# Patient Record
Sex: Female | Born: 1935
Health system: Southern US, Community
[De-identification: ages and names within clinical notes are randomized; demographics above are authoritative.]

## PROBLEM LIST (undated history)

## (undated) DIAGNOSIS — N289 Disorder of kidney and ureter, unspecified: Secondary | ICD-10-CM

## (undated) DIAGNOSIS — H33009 Unspecified retinal detachment with retinal break, unspecified eye: Secondary | ICD-10-CM

## (undated) DIAGNOSIS — H3530 Unspecified macular degeneration: Secondary | ICD-10-CM

## (undated) DIAGNOSIS — L309 Dermatitis, unspecified: Secondary | ICD-10-CM

## (undated) DIAGNOSIS — M8580 Other specified disorders of bone density and structure, unspecified site: Secondary | ICD-10-CM

## (undated) DIAGNOSIS — D649 Anemia, unspecified: Secondary | ICD-10-CM

## (undated) DIAGNOSIS — A499 Bacterial infection, unspecified: Secondary | ICD-10-CM

## (undated) DIAGNOSIS — K219 Gastro-esophageal reflux disease without esophagitis: Secondary | ICD-10-CM

## (undated) DIAGNOSIS — E538 Deficiency of other specified B group vitamins: Secondary | ICD-10-CM

## (undated) DIAGNOSIS — H33311 Horseshoe tear of retina without detachment, right eye: Secondary | ICD-10-CM

## (undated) DIAGNOSIS — E785 Hyperlipidemia, unspecified: Secondary | ICD-10-CM

## (undated) DIAGNOSIS — H903 Sensorineural hearing loss, bilateral: Secondary | ICD-10-CM

## (undated) DIAGNOSIS — N952 Postmenopausal atrophic vaginitis: Secondary | ICD-10-CM

## (undated) DIAGNOSIS — M1990 Unspecified osteoarthritis, unspecified site: Secondary | ICD-10-CM

## (undated) DIAGNOSIS — E079 Disorder of thyroid, unspecified: Secondary | ICD-10-CM

## (undated) DIAGNOSIS — L989 Disorder of the skin and subcutaneous tissue, unspecified: Secondary | ICD-10-CM

## (undated) DIAGNOSIS — Z0000 Encounter for general adult medical examination without abnormal findings: Secondary | ICD-10-CM

## (undated) DIAGNOSIS — N390 Urinary tract infection, site not specified: Secondary | ICD-10-CM

## (undated) DIAGNOSIS — M329 Systemic lupus erythematosus, unspecified: Secondary | ICD-10-CM

## (undated) DIAGNOSIS — G43109 Migraine with aura, not intractable, without status migrainosus: Secondary | ICD-10-CM

## (undated) DIAGNOSIS — M4800 Spinal stenosis, site unspecified: Secondary | ICD-10-CM

## (undated) DIAGNOSIS — E559 Vitamin D deficiency, unspecified: Secondary | ICD-10-CM

## (undated) DIAGNOSIS — N6019 Diffuse cystic mastopathy of unspecified breast: Secondary | ICD-10-CM

## (undated) HISTORY — DX: Spinal stenosis, site unspecified: M48.00

## (undated) HISTORY — DX: Hyperlipidemia, unspecified: E78.5

## (undated) HISTORY — DX: Dermatitis, unspecified: L30.9

## (undated) HISTORY — PX: CATARACT EXTRACTION W/ INTRAOCULAR LENS  IMPLANT, BILATERAL: SHX1307

## (undated) HISTORY — DX: Encounter for general adult medical examination without abnormal findings: Z00.00

## (undated) HISTORY — DX: Sensorineural hearing loss, bilateral: H90.3

## (undated) HISTORY — DX: Systemic lupus erythematosus, unspecified: M32.9

## (undated) HISTORY — DX: Migraine with aura, not intractable, without status migrainosus: G43.109

## (undated) HISTORY — DX: Urinary tract infection, site not specified: A49.9

## (undated) HISTORY — DX: Urinary tract infection, site not specified: N39.0

## (undated) HISTORY — DX: Anemia, unspecified: D64.9

## (undated) HISTORY — DX: Unspecified osteoarthritis, unspecified site: M19.90

## (undated) HISTORY — DX: Disorder of the skin and subcutaneous tissue, unspecified: L98.9

## (undated) HISTORY — DX: Gastro-esophageal reflux disease without esophagitis: K21.9

## (undated) HISTORY — DX: Vitamin D deficiency, unspecified: E55.9

## (undated) HISTORY — PX: EYE SURGERY: SHX253

## (undated) HISTORY — DX: Disorder of thyroid, unspecified: E07.9

## (undated) HISTORY — DX: Deficiency of other specified B group vitamins: E53.8

## (undated) HISTORY — DX: Unspecified retinal detachment with retinal break, unspecified eye: H33.009

## (undated) HISTORY — DX: Unspecified macular degeneration: H35.30

## (undated) HISTORY — DX: Horseshoe tear of retina without detachment, right eye: H33.311

## (undated) HISTORY — PX: VAGINAL HYSTERECTOMY: SHX2639

## (undated) HISTORY — PX: CHOLECYSTECTOMY: SHX55

## (undated) HISTORY — DX: Disorder of kidney and ureter, unspecified: N28.9

## (undated) HISTORY — DX: Postmenopausal atrophic vaginitis: N95.2

## (undated) HISTORY — DX: Diffuse cystic mastopathy of unspecified breast: N60.19

## (undated) HISTORY — DX: Other specified disorders of bone density and structure, unspecified site: M85.80

---
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
## 2004-09-14 HISTORY — PX: APPENDECTOMY: SHX54

## 2008-04-14 ENCOUNTER — Emergency Department (HOSPITAL_COMMUNITY): Admission: EM | Admit: 2008-04-14 | Discharge: 2008-04-14 | Payer: Self-pay | Admitting: Emergency Medicine

## 2008-04-26 ENCOUNTER — Emergency Department (HOSPITAL_COMMUNITY): Admission: EM | Admit: 2008-04-26 | Discharge: 2008-04-26 | Payer: Self-pay | Admitting: Emergency Medicine

## 2009-03-14 ENCOUNTER — Encounter: Admission: RE | Admit: 2009-03-14 | Discharge: 2009-03-14 | Payer: Self-pay | Admitting: Internal Medicine

## 2009-07-11 ENCOUNTER — Encounter: Admission: RE | Admit: 2009-07-11 | Discharge: 2009-07-11 | Payer: Self-pay | Admitting: Rheumatology

## 2010-03-18 ENCOUNTER — Encounter: Admission: RE | Admit: 2010-03-18 | Discharge: 2010-03-18 | Payer: Self-pay | Admitting: Physician Assistant

## 2010-11-03 ENCOUNTER — Ambulatory Visit (INDEPENDENT_AMBULATORY_CARE_PROVIDER_SITE_OTHER): Payer: Medicare HMO | Admitting: Internal Medicine

## 2010-11-03 DIAGNOSIS — I10 Essential (primary) hypertension: Secondary | ICD-10-CM

## 2010-11-03 DIAGNOSIS — K210 Gastro-esophageal reflux disease with esophagitis: Secondary | ICD-10-CM

## 2010-11-03 DIAGNOSIS — E039 Hypothyroidism, unspecified: Secondary | ICD-10-CM

## 2010-11-03 DIAGNOSIS — N390 Urinary tract infection, site not specified: Secondary | ICD-10-CM

## 2010-11-18 ENCOUNTER — Ambulatory Visit (INDEPENDENT_AMBULATORY_CARE_PROVIDER_SITE_OTHER): Payer: Medicare HMO | Admitting: Internal Medicine

## 2010-11-18 ENCOUNTER — Encounter (INDEPENDENT_AMBULATORY_CARE_PROVIDER_SITE_OTHER): Payer: Self-pay | Admitting: *Deleted

## 2010-11-18 DIAGNOSIS — E039 Hypothyroidism, unspecified: Secondary | ICD-10-CM

## 2010-11-18 DIAGNOSIS — R319 Hematuria, unspecified: Secondary | ICD-10-CM

## 2010-11-18 DIAGNOSIS — Z0000 Encounter for general adult medical examination without abnormal findings: Secondary | ICD-10-CM

## 2010-11-18 DIAGNOSIS — I10 Essential (primary) hypertension: Secondary | ICD-10-CM

## 2010-11-25 NOTE — Letter (Signed)
Summary: Pre Visit Letter Revised  [facility]  [address] [place], [place] [address]   Phone: [phone]  Fax: [phone]        11/18/2010 MRN: [id number] Molly Williams [address] [place], [place]  [address]             Procedure Date:  12/18/2010 @ 8:30   Direct colon-Dr. Arlyce Dice   Welcome to the Gastroenterology Division at [facility].    You are scheduled to see a nurse for your pre-procedure visit on 12/05/2010 at 9:00 on the 3rd floor at [facility], [address]  We ask that you try to arrive at our office 15 minutes prior to your appointment time to allow for check-in.  Please take a minute to review the attached form.  If you answer "Yes" to one or more of the questions on the first page, we ask that you call the person listed at your earliest opportunity.  If you answer "No" to all of the questions, please complete the rest of the form and bring it to your appointment.    Your nurse visit will consist of discussing your medical and surgical history, your immediate family medical history, and your medications.   If you are unable to list all of your medications on the form, please bring the medication bottles to your appointment and we will list them.  We will need to be aware of both prescribed and over the counter drugs.  We will need to know exact dosage information as well.    Please be prepared to read and sign documents such as consent forms, a financial agreement, and acknowledgement forms.  If necessary, and with your consent, a friend or relative is welcome to sit-in on the nurse visit with you.  Please bring your insurance card so that we may make a copy of it.  If your insurance requires a referral to see a specialist, please bring your referral form from your primary care physician.  No co-pay is required for this nurse visit.     If you cannot keep your appointment, please call [phone] to cancel or reschedule prior to  your appointment date.  This allows [place] the opportunity to schedule an appointment for another patient in need of care.    Thank you for choosing [facility] for your medical needs.  We appreciate the opportunity to care for you.  Please visit [place] at our website  to learn more about our practice.  Sincerely, The Gastroenterology Division

## 2010-12-01 ENCOUNTER — Ambulatory Visit (INDEPENDENT_AMBULATORY_CARE_PROVIDER_SITE_OTHER): Payer: Medicare HMO | Admitting: Internal Medicine

## 2010-12-01 DIAGNOSIS — G4700 Insomnia, unspecified: Secondary | ICD-10-CM

## 2010-12-01 DIAGNOSIS — I10 Essential (primary) hypertension: Secondary | ICD-10-CM

## 2010-12-05 ENCOUNTER — Ambulatory Visit (AMBULATORY_SURGERY_CENTER): Payer: Medicare HMO

## 2010-12-05 VITALS — Ht 63.5 in | Wt 120.8 lb

## 2010-12-05 DIAGNOSIS — Z1211 Encounter for screening for malignant neoplasm of colon: Secondary | ICD-10-CM

## 2010-12-05 MED ORDER — PEG-KCL-NACL-NASULF-NA ASC-C 100 G PO SOLR: 1.0000 | Freq: Once | ORAL | Status: AC

## 2010-12-08 ENCOUNTER — Encounter: Payer: Self-pay | Admitting: Gastroenterology

## 2010-12-18 ENCOUNTER — Ambulatory Visit (AMBULATORY_SURGERY_CENTER): Payer: Medicare HMO | Admitting: Gastroenterology

## 2010-12-18 ENCOUNTER — Other Ambulatory Visit: Payer: Medicare HMO | Admitting: Gastroenterology

## 2010-12-18 ENCOUNTER — Encounter: Payer: Self-pay | Admitting: Gastroenterology

## 2010-12-18 VITALS — BP 149/61 | HR 63 | Temp 98.1°F | Resp 19 | Ht 63.0 in | Wt 116.0 lb

## 2010-12-18 DIAGNOSIS — D126 Benign neoplasm of colon, unspecified: Secondary | ICD-10-CM

## 2010-12-18 DIAGNOSIS — Z9889 Other specified postprocedural states: Secondary | ICD-10-CM

## 2010-12-18 DIAGNOSIS — K5730 Diverticulosis of large intestine without perforation or abscess without bleeding: Secondary | ICD-10-CM

## 2010-12-18 DIAGNOSIS — Z1211 Encounter for screening for malignant neoplasm of colon: Secondary | ICD-10-CM

## 2010-12-18 MED ORDER — SODIUM CHLORIDE 0.9 % IV SOLN: 500.0000 mL | INTRAVENOUS | Status: DC

## 2010-12-18 NOTE — Patient Instructions (Addendum)
Polyps, Colon  A polyp is extra tissue that grows inside your body. Colon polyps grow in the large intestine. The large intestine, also called the colon, is part of your digestive system. It is a long, hollow tube at the end of your digestive tract where your body makes and stores stool. Most polyps are not dangerous. They are benign. This means they are not cancerous. But over time, some types of polyps can turn into cancer. Polyps that are smaller than a pea are usually not harmful. But larger polyps could someday become or may already be cancerous. To be safe, doctors remove all polyps and test them.  WHO GETS POLYPS? Anyone can get polyps, but certain people are more likely than others. You may have a greater chance of getting polyps if:  You are over 50.   You have had polyps before.   Someone in your family has had polyps.   Someone in your family has had cancer of the large intestine.   Find out if someone in your family has had polyps. You may also be more likely to get polyps if you:   Eat a lot of fatty foods   Smoke   Drink alcohol   Do not exercise  Eat too much  SYMPTOMS Most small polyps do not cause symptoms. People often do not know they have one until their caregiver finds it during a regular checkup or while testing them for something else. Some people do have symptoms like these:  Bleeding from the anus. You might notice blood on your underwear or on toilet paper after you have had a bowel movement.   Constipation or diarrhea that lasts more than a week.   Blood in the stool. Blood can make stool look black or it can show up as red streaks in the stool.  If you have any of these symptoms, see your caregiver. HOW DOES THE DOCTOR TEST FOR POLYPS? The doctor can use four tests to check for polyps:  Digital rectal exam. The caregiver wears gloves and checks your rectum (the last part of the large intestine) to see if it feels normal. This test would find polyps only  in the rectum. Your caregiver may need to do one of the other tests listed below to find polyps higher up in the intestine.   Barium enema. The caregiver puts a liquid called barium into your rectum before taking x-rays of your large intestine. Barium makes your intestine look white in the pictures. Polyps are dark, so they are easy to see.   Sigmoidoscopy. With this test, the caregiver can see inside your large intestine. A thin flexible tube is placed into your rectum. The device is called a sigmoidoscope, which has a light and a tiny video camera in it. The caregiver uses the sigmoidoscope to look at the last third of your large intestine.   Colonoscopy. This test is like sigmoidoscopy, but the caregiver looks at all of the large intestine. It usually requires sedation. This is the most common method for finding and removing polyps.  TREATMENT  The caregiver will remove the polyp during sigmoidoscopy or colonoscopy. The polyp is then tested for cancer.   If you have had polyps, your caregiver may want you to get tested regularly in the future.  PREVENTION There is not one sure way to prevent polyps. You might be able to lower your risk of getting them if you:  Eat more fruits and vegetables and less fatty food.     Do not smoke.   Avoid alcohol.   Exercise every day.   Lose weight if you are overweight.   Eating more calcium and folate can also lower your risk of getting polyps. Some foods that are rich in calcium are milk, cheese, and broccoli. Some foods that are rich in folate are chickpeas, kidney beans, and spinach.   Aspirin might help prevent polyps. Studies are under way.  Document Released: 05/27/2004 Document Re-Released: 02/18/2010 [facility] Patient Information 2011 [place], [place].  Diverticulosis and High Fiber Diet instructions given.  Follow up with primary care provider for any additional concerns.

## 2010-12-22 ENCOUNTER — Telehealth: Payer: Self-pay | Admitting: *Deleted

## 2010-12-22 NOTE — Telephone Encounter (Signed)

## 2010-12-25 ENCOUNTER — Other Ambulatory Visit: Payer: Medicare HMO | Admitting: Gastroenterology

## 2010-12-26 ENCOUNTER — Encounter: Payer: Self-pay | Admitting: Gastroenterology

## 2011-02-03 ENCOUNTER — Ambulatory Visit (INDEPENDENT_AMBULATORY_CARE_PROVIDER_SITE_OTHER): Payer: Medicare HMO | Admitting: Internal Medicine

## 2011-02-03 DIAGNOSIS — M545 Low back pain: Secondary | ICD-10-CM

## 2011-02-03 DIAGNOSIS — N390 Urinary tract infection, site not specified: Secondary | ICD-10-CM

## 2011-02-03 DIAGNOSIS — I10 Essential (primary) hypertension: Secondary | ICD-10-CM

## 2011-02-17 ENCOUNTER — Ambulatory Visit: Payer: Medicare HMO | Admitting: Internal Medicine

## 2011-02-20 ENCOUNTER — Other Ambulatory Visit: Payer: Self-pay | Admitting: Internal Medicine

## 2011-02-20 DIAGNOSIS — Z1231 Encounter for screening mammogram for malignant neoplasm of breast: Secondary | ICD-10-CM

## 2011-02-24 ENCOUNTER — Other Ambulatory Visit: Payer: Self-pay | Admitting: Internal Medicine

## 2011-02-24 DIAGNOSIS — M8580 Other specified disorders of bone density and structure, unspecified site: Secondary | ICD-10-CM

## 2011-03-20 ENCOUNTER — Ambulatory Visit
Admission: RE | Admit: 2011-03-20 | Discharge: 2011-03-20 | Disposition: A | Discharge status: Home / Self Care | Payer: Medicare HMO | Source: Ambulatory Visit | Attending: Internal Medicine | Admitting: Internal Medicine

## 2011-03-20 DIAGNOSIS — Z1231 Encounter for screening mammogram for malignant neoplasm of breast: Secondary | ICD-10-CM

## 2011-03-20 DIAGNOSIS — M8580 Other specified disorders of bone density and structure, unspecified site: Secondary | ICD-10-CM

## 2011-03-20 LAB — HM MAMMOGRAPHY: HM Mammogram: NEGATIVE

## 2011-03-20 LAB — HM DEXA SCAN

## 2011-03-23 ENCOUNTER — Encounter: Payer: Self-pay | Admitting: Emergency Medicine

## 2011-03-24 ENCOUNTER — Encounter: Payer: Self-pay | Admitting: Emergency Medicine

## 2011-04-21 ENCOUNTER — Other Ambulatory Visit: Payer: Self-pay | Admitting: Internal Medicine

## 2011-04-21 ENCOUNTER — Encounter: Payer: Self-pay | Admitting: Internal Medicine

## 2011-04-21 ENCOUNTER — Ambulatory Visit (INDEPENDENT_AMBULATORY_CARE_PROVIDER_SITE_OTHER): Payer: Medicare HMO | Admitting: Internal Medicine

## 2011-04-21 VITALS — BP 138/60 | HR 56 | Temp 98.8°F | Ht 63.25 in | Wt 119.0 lb

## 2011-04-21 DIAGNOSIS — E039 Hypothyroidism, unspecified: Secondary | ICD-10-CM | POA: Insufficient documentation

## 2011-04-21 DIAGNOSIS — R319 Hematuria, unspecified: Secondary | ICD-10-CM | POA: Insufficient documentation

## 2011-04-21 DIAGNOSIS — M8580 Other specified disorders of bone density and structure, unspecified site: Secondary | ICD-10-CM | POA: Insufficient documentation

## 2011-04-21 DIAGNOSIS — N952 Postmenopausal atrophic vaginitis: Secondary | ICD-10-CM | POA: Insufficient documentation

## 2011-04-21 DIAGNOSIS — K219 Gastro-esophageal reflux disease without esophagitis: Secondary | ICD-10-CM | POA: Insufficient documentation

## 2011-04-21 DIAGNOSIS — M4800 Spinal stenosis, site unspecified: Secondary | ICD-10-CM | POA: Insufficient documentation

## 2011-04-21 DIAGNOSIS — N390 Urinary tract infection, site not specified: Secondary | ICD-10-CM

## 2011-04-21 DIAGNOSIS — A499 Bacterial infection, unspecified: Secondary | ICD-10-CM | POA: Insufficient documentation

## 2011-04-21 DIAGNOSIS — E079 Disorder of thyroid, unspecified: Secondary | ICD-10-CM | POA: Insufficient documentation

## 2011-04-21 DIAGNOSIS — N6019 Diffuse cystic mastopathy of unspecified breast: Secondary | ICD-10-CM | POA: Insufficient documentation

## 2011-04-21 DIAGNOSIS — H9190 Unspecified hearing loss, unspecified ear: Secondary | ICD-10-CM | POA: Insufficient documentation

## 2011-04-21 DIAGNOSIS — M1990 Unspecified osteoarthritis, unspecified site: Secondary | ICD-10-CM | POA: Insufficient documentation

## 2011-04-21 DIAGNOSIS — H903 Sensorineural hearing loss, bilateral: Secondary | ICD-10-CM | POA: Insufficient documentation

## 2011-04-21 DIAGNOSIS — K8020 Calculus of gallbladder without cholecystitis without obstruction: Secondary | ICD-10-CM | POA: Insufficient documentation

## 2011-04-21 DIAGNOSIS — M329 Systemic lupus erythematosus, unspecified: Secondary | ICD-10-CM | POA: Insufficient documentation

## 2011-04-21 LAB — POCT URINALYSIS DIPSTICK
Bilirubin, UA: NEGATIVE
Glucose, UA: NEGATIVE
Leukocytes, UA: NEGATIVE
Nitrite, UA: NEGATIVE
Urobilinogen, UA: NORMAL

## 2011-04-21 NOTE — Patient Instructions (Signed)
Take prilosec every day until our next visit  Call office Thursday for urine culture results

## 2011-04-21 NOTE — Progress Notes (Signed)
Subjective:    Patient ID: Molly Williams, female    DOB: 11/30/1930, 77 y.o.   MRN: [id number]  HPI  Natausha is here with two concerns.  Her urine has been "frothy" and she was afraid she may have another UTI.  She no frequency or urgency,  Minimal dysuria.  No fever or CVA pain  She also for the past 2-3 weeks has worsening reflux.   She has not been on the Pepcid chewables because she can't find them at the pharmacy. Has night time burning pain, no change in color of stools, .  Appetitie good  Allergies  Allergen Reactions  . Codeine Nausea And Vomiting  . Iodine Hives    Contrast dye   Past Medical History  Diagnosis Date  . Atherosclerosis   . Migraine aura without headache     h/o ocular migraines  . Osteopenia   . Spinal stenosis   . Retinal detachment with break     right/laser  . GERD (gastroesophageal reflux disease)   . Hearing loss sensory, bilateral     bil/hearing aids  . Arthritis   . Urinary tract bacterial infections     has had urethral dilations in past  . Atrophic vaginitis   . Lupus (systemic lupus erythematosus)     Dr. Kellie Simmering  . Hematuria     chronic Dr. Merry Lofty  . Hearing loss   . Fibrocystic breast disease     bx 2008 neg per pt report  . Thyroid disease     hypothyroidism  . Cholelithiasis 2008   Past Surgical History  Procedure Date  . Cataract extraction w/ intraocular lens  implant, bilateral   . Vaginal hysterectomy     partial  . Cholecystectomy   . Tonsillectomy 1940  . Appendectomy 2006   History   Social History  . Marital Status: Widowed    Spouse Name: N/A    Number of Children: N/A  . Years of Education: N/A   Occupational History  . Not on file.   Social History Main Topics  . Smoking status: Former Games developer  . Smokeless tobacco: Never Used  . Alcohol Use: 6.0 oz/week    10 Glasses of wine per week  . Drug Use: No  . Sexually Active: Not on file   Other Topics Concern  . Not on file   Social History  Narrative  . No narrative on file   Family History  Problem Relation Age of Onset  . Stroke Paternal Grandmother   . Stroke Father   . Coronary artery disease Father   . Coronary artery disease Mother    Patient Active Problem List  Diagnoses  . GERD (gastroesophageal reflux disease)  . Thyroid disease  . Arthritis  . Urinary tract bacterial infections  . Atrophic vaginitis  . Osteopenia  . Spinal stenosis  . Hearing loss  . Fibrocystic breast disease  . Fibrocystic breast disease  . Cholelithiasis  . Hematuria   Current Outpatient Prescriptions on File Prior to Visit  Medication Sig Dispense Refill  . acetaminophen (TYLENOL) 500 MG tablet Take 1,000 mg by mouth every 6 (six) hours as needed. pain       . Cholecalciferol (VITAMIN D) 1000 UNITS capsule Take 1,000 Units by mouth daily.        . famotidine (PEPCID AC) 10 MG chewable tablet Chew 10 mg by mouth daily.        Marland Kitchen levothyroxine (SYNTHROID, LEVOTHROID) 25 MCG tablet Take 25 mcg by mouth  daily.      . Estradiol (VAGIFEM) 10 MCG TABS Place 1 tablet vaginally 2 (two) times a week.        Marland Kitchen HYDROcodone-acetaminophen (NORCO) 5-325 MG per tablet Take 1 tablet by mouth every 6 (six) hours as needed. pain        Current Facility-Administered Medications on File Prior to Visit  Medication Dose Route Frequency Provider Last Rate Last Dose  . DISCONTD: 0.9 %  sodium chloride infusion  500 mL Intravenous Continuous Louis Meckel, MD            Review of Systems  See HPI     Objective:   Physical Exam   Physical Exam  Nursing note and vitals reviewed.  Constitutional: She is oriented to person, place, and time. She appears well-developed and well-nourished.  HENT:  Head: Normocephalic and atraumatic.  Cardiovascular: Normal rate and regular rhythm. Exam reveals no gallop and no friction rub.  No murmur heard.  Pulmonary/Chest: Breath sounds normal. She has no wheezes. She has no rales.  ABD:  No CVA tenderness.   NO HSM.  BS normal.  Nontender in epigastrium.  No rebound Neurological: She is alert and oriented to person, place, and time.  Skin: Skin is warm and dry.  Psychiatric: She has a normal mood and affect. Her behavior is normal.             Assessment & Plan  1)  GERD  Will try omeprazole for the next 4 weeks.  If not better may need endoscopy.  Pt aware to make return visit with me in 4 weeks  2) hematuria:  U/A today just shows hematuria which is chronic for her.  Will send for culture and pt to call Thursday for results.  Shw voice understanding

## 2011-04-24 LAB — CULTURE, URINE COMPREHENSIVE: Colony Count: 8000

## 2011-04-27 ENCOUNTER — Telehealth: Payer: Self-pay | Admitting: Emergency Medicine

## 2011-04-27 NOTE — Telephone Encounter (Signed)
LMOVM for pt, advised urine culture shows no infection.  Advised pt to call back with any questions

## 2011-05-21 ENCOUNTER — Ambulatory Visit (INDEPENDENT_AMBULATORY_CARE_PROVIDER_SITE_OTHER): Payer: Medicare HMO | Admitting: Internal Medicine

## 2011-05-21 ENCOUNTER — Encounter: Payer: Self-pay | Admitting: Internal Medicine

## 2011-05-21 VITALS — BP 139/70 | HR 64 | Temp 97.2°F | Resp 16 | Wt 120.0 lb

## 2011-05-21 DIAGNOSIS — K219 Gastro-esophageal reflux disease without esophagitis: Secondary | ICD-10-CM

## 2011-05-21 NOTE — Patient Instructions (Signed)
Continue Prilosec for 4 more weeks  Return if no improvement

## 2011-05-21 NOTE — Progress Notes (Signed)
Subjective:    Patient ID: Molly Williams, female    DOB: 11/10/1930, 77 y.o.   MRN: [id number]  HPI  Molly Williams is doing much better.  No heartburn on Prilosec for the last 4 weeks.  She reports she had an H pylori in the distant past and was told it was negative.  No change in color of stools.   No epigastric pain.  Appetitie good   Allergies  Allergen Reactions  . Codeine Nausea And Vomiting  . Iodine Hives    Contrast dye   Past Medical History  Diagnosis Date  . Migraine aura without headache     h/o ocular migraines  . Osteopenia   . Spinal stenosis   . Retinal detachment with break     right/laser  . GERD (gastroesophageal reflux disease)   . Hearing loss sensory, bilateral     bil/hearing aids  . Arthritis   . Urinary tract bacterial infections     has had urethral dilations in past  . Atrophic vaginitis   . Lupus (systemic lupus erythematosus)     Dr. Kellie Simmering  . Hematuria     chronic Dr. Merry Lofty  . Hearing loss   . Fibrocystic breast disease     bx 2008 neg per pt report  . Thyroid disease     hypothyroidism  . Cholelithiasis 2008   Past Surgical History  Procedure Date  . Cataract extraction w/ intraocular lens  implant, bilateral   . Vaginal hysterectomy     partial  . Cholecystectomy   . Tonsillectomy 1940  . Appendectomy 2006   History   Social History  . Marital Status: Widowed    Spouse Name: N/A    Number of Children: N/A  . Years of Education: N/A   Occupational History  . Not on file.   Social History Main Topics  . Smoking status: Former Games developer  . Smokeless tobacco: Never Used  . Alcohol Use: 6.0 oz/week    10 Glasses of wine per week  . Drug Use: No  . Sexually Active: Not on file   Other Topics Concern  . Not on file   Social History Narrative  . No narrative on file   Family History  Problem Relation Age of Onset  . Stroke Paternal Grandmother   . Stroke Father   . Coronary artery disease Father   . Coronary artery  disease Mother    Patient Active Problem List  Diagnoses  . GERD (gastroesophageal reflux disease)  . Thyroid disease  . Arthritis  . Urinary tract bacterial infections  . Atrophic vaginitis  . Osteopenia  . Spinal stenosis  . Hearing loss  . Fibrocystic breast disease  . Fibrocystic breast disease  . Cholelithiasis  . Hematuria  . Lupus (systemic lupus erythematosus)  . Hearing loss sensory, bilateral   Current Outpatient Prescriptions on File Prior to Visit  Medication Sig Dispense Refill  . acetaminophen (TYLENOL) 500 MG tablet Take 1,000 mg by mouth every 6 (six) hours as needed. pain       . HYDROcodone-acetaminophen (NORCO) 5-325 MG per tablet Take 1 tablet by mouth every 6 (six) hours as needed. pain       . levothyroxine (SYNTHROID, LEVOTHROID) 25 MCG tablet Take 25 mcg by mouth daily.          Review of Systems See HPI    Objective:   Physical Exam  Physical Exam  Nursing note and vitals reviewed.  Constitutional: She is oriented to person,  place, and time. She appears well-developed and well-nourished.  HENT:  Head: Normocephalic and atraumatic.  Cardiovascular: Normal rate and regular rhythm. Exam reveals no gallop and no friction rub.  No murmur heard.  Pulmonary/Chest: Breath sounds normal. She has no wheezes. She has no rales.  ABD:  BS +  No pain in upper quadtants to palpation Neurological: She is alert and oriented to person, place, and time.  Skin: Skin is warm and dry.  Psychiatric: She has a normal mood and affect. Her behavior is normal.        Assessment & Plan:  1)  GERD  Improved.  Will check H Pylori today.  Continue Prilosec for 1 more month.  I counseled pt that if dyspepsia and heartburn returns we will have to consider EGD

## 2011-07-10 ENCOUNTER — Ambulatory Visit (INDEPENDENT_AMBULATORY_CARE_PROVIDER_SITE_OTHER): Payer: Medicare HMO | Admitting: Emergency Medicine

## 2011-07-10 VITALS — BP 116/50 | HR 54 | Temp 97.2°F | Resp 10

## 2011-07-10 DIAGNOSIS — Z23 Encounter for immunization: Secondary | ICD-10-CM

## 2011-07-15 ENCOUNTER — Telehealth: Payer: Self-pay | Admitting: Internal Medicine

## 2011-07-15 NOTE — Telephone Encounter (Signed)
Pt would like to be referral to a Dermatologist; she is not aware of a good one. Her number is [phone].  Thanks

## 2011-07-16 NOTE — Telephone Encounter (Signed)
Per DDS, she would recommend [facility] [place].  LMOAM per pt's request with information

## 2011-07-16 NOTE — Telephone Encounter (Signed)
Spoke with Molly Williams.  She states that she needs to see a dermatologist- she has sun damage that she needs to have checked and a mole that needs to be checked.  Since her insurance does not require a referral, she would like the names of who DDS would recommend and she will be happy to make her own appointment.  Aware I will ask DDS and call her back

## 2011-09-03 ENCOUNTER — Telehealth: Payer: Self-pay | Admitting: Emergency Medicine

## 2011-09-03 NOTE — Telephone Encounter (Signed)
I spoke with Molly Williams this morning.  She states that she spoke with her ENT physician's nurse this morning and they believe she may have a parotid gland infection.  Her ENT no longer treats these types of things- he specializes in surgical procedures and "special cases", so he can not see her for this problem.  They advised she contact her PCP for treatment.  I explained that DDS is OOO until next week.  I advised that she seek care at an Urgent care today- directed her to Urgent care in [place].  She asked for the phone number and I gave it to her.  She is not having any pain, just a sense of fullness in her ear.  No fevers.  She states that she will go there today.

## 2011-12-07 ENCOUNTER — Other Ambulatory Visit: Payer: Self-pay | Admitting: Emergency Medicine

## 2011-12-07 MED ORDER — LEVOTHYROXINE SODIUM 25 MCG PO TABS: 25.0000 ug | ORAL_TABLET | Freq: Every day | ORAL | Status: DC

## 2011-12-07 NOTE — Telephone Encounter (Signed)
Refill request received for Molly Williams's levothyroxine.  Approved and faxed back per DDS.  Molly Williams is due for CPE, called and spoke with [name] about the same.  Scheduled her for CPE Monday 01/18/12 @ 945am.  She has a new Medicare supplement and will bring care to her visit

## 2012-01-18 ENCOUNTER — Encounter: Payer: Medicare HMO | Admitting: Internal Medicine

## 2012-01-28 ENCOUNTER — Encounter: Payer: Self-pay | Admitting: Internal Medicine

## 2012-01-28 ENCOUNTER — Ambulatory Visit (INDEPENDENT_AMBULATORY_CARE_PROVIDER_SITE_OTHER): Payer: MEDICARE | Admitting: Internal Medicine

## 2012-01-28 VITALS — BP 114/60 | HR 60 | Temp 97.4°F | Resp 16 | Ht 64.0 in | Wt 116.0 lb

## 2012-01-28 DIAGNOSIS — E079 Disorder of thyroid, unspecified: Secondary | ICD-10-CM

## 2012-01-28 DIAGNOSIS — E039 Hypothyroidism, unspecified: Secondary | ICD-10-CM

## 2012-01-28 DIAGNOSIS — M129 Arthropathy, unspecified: Secondary | ICD-10-CM

## 2012-01-28 DIAGNOSIS — M1990 Unspecified osteoarthritis, unspecified site: Secondary | ICD-10-CM

## 2012-01-28 DIAGNOSIS — M329 Systemic lupus erythematosus, unspecified: Secondary | ICD-10-CM

## 2012-01-28 DIAGNOSIS — M8580 Other specified disorders of bone density and structure, unspecified site: Secondary | ICD-10-CM

## 2012-01-28 DIAGNOSIS — M4800 Spinal stenosis, site unspecified: Secondary | ICD-10-CM

## 2012-01-28 DIAGNOSIS — K219 Gastro-esophageal reflux disease without esophagitis: Secondary | ICD-10-CM

## 2012-01-28 DIAGNOSIS — M899 Disorder of bone, unspecified: Secondary | ICD-10-CM

## 2012-01-28 DIAGNOSIS — R319 Hematuria, unspecified: Secondary | ICD-10-CM

## 2012-01-28 DIAGNOSIS — Z0000 Encounter for general adult medical examination without abnormal findings: Secondary | ICD-10-CM

## 2012-01-28 LAB — POCT URINALYSIS DIPSTICK
Glucose, UA: NEGATIVE
Ketones, UA: NEGATIVE
Spec Grav, UA: <=1.005
Urobilinogen, UA: >=8

## 2012-01-28 LAB — COMPREHENSIVE METABOLIC PANEL
ALT: 10 U/L (ref 0–35)
AST: 17 U/L (ref 0–37)
Albumin: 4.2 g/dL (ref 3.5–5.2)
Alkaline Phosphatase: 52 U/L (ref 39–117)
Glucose, Bld: 80 mg/dL (ref 70–99)
Potassium: 4.6 mEq/L (ref 3.5–5.3)
Sodium: 136 mEq/L (ref 135–145)
Total Protein: 6.6 g/dL (ref 6.0–8.3)

## 2012-01-28 LAB — CBC WITH DIFFERENTIAL/PLATELET
Basophils Relative: 1 % (ref 0–1)
Hemoglobin: 11.4 g/dL — ABNORMAL LOW (ref 12.0–15.0)
MCHC: 33 g/dL (ref 30.0–36.0)
Monocytes Relative: 15 % — ABNORMAL HIGH (ref 3–12)
Neutro Abs: 2.4 10*3/uL (ref 1.7–7.7)
Neutrophils Relative %: 51 % (ref 43–77)
RBC: 3.55 MIL/uL — ABNORMAL LOW (ref 3.87–5.11)

## 2012-01-28 LAB — LIPID PANEL
LDL Cholesterol: 100 mg/dL — ABNORMAL HIGH (ref 0–99)
Triglycerides: 53 mg/dL (ref <150)

## 2012-01-28 LAB — TSH: TSH: 3.263 u[IU]/mL (ref 0.350–4.500)

## 2012-01-28 NOTE — Patient Instructions (Signed)
See me as needed 

## 2012-01-28 NOTE — Progress Notes (Signed)
Subjective:    Patient ID: Molly Williams, female    DOB: Jul 31, 1931, 78 y.o.   MRN: [id number]  HPI Molly Williams is here for comprehensive eval.  Overall doing well .  Had some problems with allergies but better now.  Lupus and joint artralgias much improved after seeing Dr. Kellie Simmering.    She is not due for mammogram until July  Allergies  Allergen Reactions  . Codeine Nausea And Vomiting  . Iodine Hives    Contrast dye   Past Medical History  Diagnosis Date  . Migraine aura without headache     h/o ocular migraines  . Osteopenia   . Spinal stenosis   . Retinal detachment with break     right/laser  . GERD (gastroesophageal reflux disease)   . Hearing loss sensory, bilateral     bil/hearing aids  . Arthritis   . Urinary tract bacterial infections     has had urethral dilations in past  . Atrophic vaginitis   . Lupus (systemic lupus erythematosus)     Dr. Kellie Simmering  . Hematuria     chronic Dr. Merry Lofty  . Hearing loss   . Fibrocystic breast disease     bx 2008 neg per pt report  . Thyroid disease     hypothyroidism  . Cholelithiasis 2008   Past Surgical History  Procedure Date  . Cataract extraction w/ intraocular lens  implant, bilateral   . Vaginal hysterectomy     partial  . Cholecystectomy   . Tonsillectomy 1940  . Appendectomy 2006   History   Social History  . Marital Status: Widowed    Spouse Name: N/A    Number of Children: N/A  . Years of Education: N/A   Occupational History  . Not on file.   Social History Main Topics  . Smoking status: Former Games developer  . Smokeless tobacco: Never Used  . Alcohol Use: 6.0 oz/week    10 Glasses of wine per week  . Drug Use: No  . Sexually Active: Not on file   Other Topics Concern  . Not on file   Social History Narrative  . No narrative on file   Family History  Problem Relation Age of Onset  . Stroke Paternal Grandmother   . Stroke Father   . Coronary artery disease Father   . Coronary artery disease  Mother    Patient Active Problem List  Diagnoses  . GERD (gastroesophageal reflux disease)  . Thyroid disease  . Arthritis  . Urinary tract bacterial infections  . Atrophic vaginitis  . Osteopenia  . Spinal stenosis  . Hearing loss  . Fibrocystic breast disease  . Fibrocystic breast disease  . Cholelithiasis  . Hematuria  . Lupus (systemic lupus erythematosus)  . Hearing loss sensory, bilateral   Current Outpatient Prescriptions on File Prior to Visit  Medication Sig Dispense Refill  . acetaminophen (TYLENOL) 500 MG tablet Take 1,000 mg by mouth every 6 (six) hours as needed. pain       . calcium-vitamin D (OS-CAL 500 + D) 500-200 MG-UNIT per tablet Take 1 tablet by mouth 2 (two) times daily.        Marland Kitchen HYDROcodone-acetaminophen (NORCO) 5-325 MG per tablet Take 1 tablet by mouth every 6 (six) hours as needed. pain       . levothyroxine (SYNTHROID, LEVOTHROID) 25 MCG tablet Take 1 tablet (25 mcg total) by mouth daily.  90 tablet  0  . omeprazole (PRILOSEC) 20 MG capsule Take 20 mg by  mouth daily.             Review of Systems     Objective:   Physical Exam  Physical Exam  Nursing note and vitals reviewed.  Constitutional: She is oriented to person, place, and time. She appears well-developed and well-nourished.  HENT:  Head: Normocephalic and atraumatic.  Right Ear: Tympanic membrane and ear canal normal. No drainage. Tympanic membrane is not injected and not erythematous.  Left Ear: Tympanic membrane and ear canal normal. No drainage. Tympanic membrane is not injected and not erythematous.  Nose: Nose normal. Right sinus exhibits no maxillary sinus tenderness and no frontal sinus tenderness. Left sinus exhibits no maxillary sinus tenderness and no frontal sinus tenderness.  Mouth/Throat: Oropharynx is clear and moist. No oral lesions. No oropharyngeal exudate.  Eyes: Conjunctivae and EOM are normal. Pupils are equal, round, and reactive to light.  Neck: Normal range of  motion. Neck supple. No JVD present. Carotid bruit is not present. No mass and no thyromegaly present.  Cardiovascular: Normal rate, regular rhythm, S1 normal, S2 normal and intact distal pulses. Exam reveals no gallop and no friction rub.  No murmur heard.  Pulses:  Carotid pulses are 2+ on the right side, and 2+ on the left side.  Dorsalis pedis pulses are 2+ on the right side, and 2+ on the left side.  No carotid bruit. No LE edema  Pulmonary/Chest: Breath sounds normal. She has no wheezes. She has no rales. She exhibits no tenderness.   Breast no discrete mass no nipple discharge no axillary adenopathy bilaterally Abdominal: Soft. Bowel sounds are normal. She exhibits no distension and no mass. There is no hepatosplenomegaly. There is no tenderness. There is no CVA tenderness.  Musculoskeletal: Normal range of motion.  No active synovitis to joints.  Lymphadenopathy:  She has no cervical adenopathy.  She has no axillary adenopathy.  Right: No inguinal and no supraclavicular adenopathy present.  Left: No inguinal and no supraclavicular adenopathy present.  Neurological: She is alert and oriented to person, place, and time. She has normal strength and normal reflexes. She displays no tremor. No cranial nerve deficit or sensory deficit. Coordination and gait normal.  Skin: Skin is warm and dry. No rash noted. No cyanosis. Nails show no clubbing.  Psychiatric: She has a normal mood and affect. Her speech is normal and behavior is normal. Cognition and memory are normal.       Assessment & Plan:  1) Hypothyrodism  Will recheck TSH today 2) GERD  adequatae control 3)  DJD/Lupus/spinal stenosis  Good control  Managed by Dr. Kellie Simmering 4)  Hematuria chronic will send urine for C and S today 5)  Osteopenia  Due for Dexa in 2014.  Continue calcium and vitamin D  I spent 45 mintues with this pt.

## 2012-01-30 LAB — CULTURE, URINE COMPREHENSIVE
Colony Count: NO GROWTH
Organism ID, Bacteria: NO GROWTH

## 2012-02-10 ENCOUNTER — Telehealth: Payer: Self-pay | Admitting: *Deleted

## 2012-02-10 NOTE — Telephone Encounter (Signed)
Copy of labs mailed to pt's home address. 

## 2012-03-02 ENCOUNTER — Other Ambulatory Visit: Payer: Self-pay | Admitting: *Deleted

## 2012-03-03 MED ORDER — LEVOTHYROXINE SODIUM 25 MCG PO TABS: 25.0000 ug | ORAL_TABLET | Freq: Every day | ORAL | Status: DC

## 2012-03-22 ENCOUNTER — Other Ambulatory Visit: Payer: Self-pay | Admitting: Internal Medicine

## 2012-03-22 DIAGNOSIS — Z1231 Encounter for screening mammogram for malignant neoplasm of breast: Secondary | ICD-10-CM

## 2012-03-24 ENCOUNTER — Ambulatory Visit
Admission: RE | Admit: 2012-03-24 | Discharge: 2012-03-24 | Disposition: A | Discharge status: Home / Self Care | Payer: Medicare Other | Source: Ambulatory Visit | Attending: Internal Medicine | Admitting: Internal Medicine

## 2012-03-24 DIAGNOSIS — Z1231 Encounter for screening mammogram for malignant neoplasm of breast: Secondary | ICD-10-CM

## 2012-07-05 ENCOUNTER — Ambulatory Visit (INDEPENDENT_AMBULATORY_CARE_PROVIDER_SITE_OTHER): Payer: Medicare Other | Admitting: *Deleted

## 2012-07-05 DIAGNOSIS — Z23 Encounter for immunization: Secondary | ICD-10-CM

## 2012-12-26 ENCOUNTER — Encounter: Payer: Self-pay | Admitting: Internal Medicine

## 2012-12-26 ENCOUNTER — Ambulatory Visit (INDEPENDENT_AMBULATORY_CARE_PROVIDER_SITE_OTHER): Payer: Medicare Other | Admitting: Internal Medicine

## 2012-12-26 VITALS — BP 121/64 | HR 66 | Temp 98.5°F | Resp 18 | Wt 120.0 lb

## 2012-12-26 DIAGNOSIS — A499 Bacterial infection, unspecified: Secondary | ICD-10-CM

## 2012-12-26 DIAGNOSIS — R3989 Other symptoms and signs involving the genitourinary system: Secondary | ICD-10-CM

## 2012-12-26 DIAGNOSIS — J309 Allergic rhinitis, unspecified: Secondary | ICD-10-CM

## 2012-12-26 DIAGNOSIS — N390 Urinary tract infection, site not specified: Secondary | ICD-10-CM

## 2012-12-26 DIAGNOSIS — R399 Unspecified symptoms and signs involving the genitourinary system: Secondary | ICD-10-CM

## 2012-12-26 LAB — POCT URINALYSIS DIPSTICK
Glucose, UA: NEGATIVE
Nitrite, UA: NEGATIVE
Spec Grav, UA: 1.01
Urobilinogen, UA: NEGATIVE

## 2012-12-26 MED ORDER — CIPROFLOXACIN HCL 250 MG PO TABS: 250.0000 mg | ORAL_TABLET | Freq: Two times a day (BID) | ORAL | Status: DC

## 2012-12-26 NOTE — Patient Instructions (Addendum)
See me in 3 weeks

## 2012-12-26 NOTE — Progress Notes (Signed)
Subjective:    Patient ID: Molly Williams, female    DOB: 10-20-1930, 79 y.o.   MRN: [id number]  HPI  Molly Williams is here for acute visit.    She has had 4 days dysuria with urgency  No fever no flank pain. It has been quite some time since Molly Williams has been here for any UTI related symptoms  She also note her ears have been itching.  She recently bouth Nasacort OTC and just started usling.  No drainage or pain in either ear  Allergies  Allergen Reactions  . Codeine Nausea And Vomiting  . Iodine Hives    Contrast dye   Past Medical History  Diagnosis Date  . Migraine aura without headache     h/o ocular migraines  . Osteopenia   . Spinal stenosis   . Retinal detachment with break     right/laser  . GERD (gastroesophageal reflux disease)   . Hearing loss sensory, bilateral     bil/hearing aids  . Arthritis   . Urinary tract bacterial infections     has had urethral dilations in past  . Atrophic vaginitis   . Lupus (systemic lupus erythematosus)     Dr. Kellie Simmering  . Hematuria     chronic Dr. Merry Lofty  . Hearing loss   . Fibrocystic breast disease     bx 2008 neg per pt report  . Thyroid disease     hypothyroidism  . Cholelithiasis 2008   Past Surgical History  Procedure Laterality Date  . Cataract extraction w/ intraocular lens  implant, bilateral    . Vaginal hysterectomy      partial  . Cholecystectomy    . Tonsillectomy  1940  . Appendectomy  2006   History   Social History  . Marital Status: Widowed    Spouse Name: N/A    Number of Children: N/A  . Years of Education: N/A   Occupational History  . Not on file.   Social History Main Topics  . Smoking status: Former Games developer  . Smokeless tobacco: Never Used  . Alcohol Use: 6.0 oz/week    10 Glasses of wine per week  . Drug Use: No  . Sexually Active: Not on file   Other Topics Concern  . Not on file   Social History Narrative  . No narrative on file   Family History  Problem Relation Age of Onset  .  Stroke Paternal Grandmother   . Stroke Father   . Coronary artery disease Father   . Coronary artery disease Mother    Patient Active Problem List  Diagnosis  . GERD (gastroesophageal reflux disease)  . Thyroid disease  . Arthritis  . Urinary tract bacterial infections  . Atrophic vaginitis  . Osteopenia  . Spinal stenosis  . Hearing loss  . Fibrocystic breast disease  . Fibrocystic breast disease  . Cholelithiasis  . Hematuria  . Lupus (systemic lupus erythematosus)  . Hearing loss sensory, bilateral   Current Outpatient Prescriptions on File Prior to Visit  Medication Sig Dispense Refill  . acetaminophen (TYLENOL) 500 MG tablet Take 1,000 mg by mouth every 6 (six) hours as needed. pain       . diphenhydrAMINE (BENADRYL) 25 mg capsule Take 25 mg by mouth at bedtime as needed.      . famotidine-calcium carbonate-magnesium hydroxide (PEPCID COMPLETE) 10-800-165 MG CHEW Chew 1 tablet by mouth daily as needed.      Marland Kitchen levothyroxine (SYNTHROID, LEVOTHROID) 25 MCG tablet Take 1 tablet (25  mcg total) by mouth daily.  90 tablet  3  . calcium-vitamin D (OS-CAL 500 + D) 500-200 MG-UNIT per tablet Take 1 tablet by mouth 2 (two) times daily.         No current facility-administered medications on file prior to visit.       Review of Systems See HPI    Objective:   Physical Exam Physical Exam  Nursing note and vitals reviewed.  Constitutional: She is oriented to person, place, and time. She appears well-developed and well-nourished.  HENT:  Head: Normocephalic and atraumatic.  TM:  Canal clear bilateral serous effusions. Cardiovascular: Normal rate and regular rhythm. Exam reveals no gallop and no friction rub.  No murmur heard.  Pulmonary/Chest: Breath sounds normal. She has no wheezes. She has no rales.  ABD  No flank pain  Some suprapubic pain to palpaiton Neurological: She is alert and oriented to person, place, and time.  Skin: Skin is warm and dry.  Psychiatric: She has a  normal mood and affect. Her behavior is normal.         Assessment & Plan:  UTI:  Will give Cipro 250 mg for 2 weeks.  This seemed to suppress her symptoms in the past  Allergic rhinitis with ear itching.  Continue Nasacort bid for 2 weeks then once daily  See me in 3 weeks

## 2012-12-28 LAB — URINE CULTURE: Colony Count: 50000

## 2012-12-29 ENCOUNTER — Telehealth: Payer: Self-pay | Admitting: *Deleted

## 2012-12-29 NOTE — Telephone Encounter (Signed)
Message copied by Mathews Robinsons on Thu Dec 29, 2012  2:53 PM ------      Message from: Raechel Chute D      Created: Wed Dec 28, 2012  6:31 PM       Karen Kitchens            Call pt and let her know that her urine culture shows e Coli that is sensitive to the antibitotic sh eis on ------

## 2012-12-29 NOTE — Telephone Encounter (Signed)
LVM to return call for UA results

## 2013-01-16 ENCOUNTER — Ambulatory Visit (INDEPENDENT_AMBULATORY_CARE_PROVIDER_SITE_OTHER): Payer: Medicare Other | Admitting: Internal Medicine

## 2013-01-16 ENCOUNTER — Encounter: Payer: Self-pay | Admitting: Internal Medicine

## 2013-01-16 VITALS — BP 113/67 | HR 61 | Temp 98.4°F | Resp 18 | Wt 120.0 lb

## 2013-01-16 DIAGNOSIS — N390 Urinary tract infection, site not specified: Secondary | ICD-10-CM

## 2013-01-16 DIAGNOSIS — Z87440 Personal history of urinary (tract) infections: Secondary | ICD-10-CM

## 2013-01-16 DIAGNOSIS — J309 Allergic rhinitis, unspecified: Secondary | ICD-10-CM

## 2013-01-16 DIAGNOSIS — A499 Bacterial infection, unspecified: Secondary | ICD-10-CM

## 2013-01-16 LAB — POCT URINALYSIS DIPSTICK
Blood, UA: NEGATIVE
Protein, UA: NEGATIVE
Spec Grav, UA: 1.01
Urobilinogen, UA: NEGATIVE
pH, UA: 6.5

## 2013-01-16 NOTE — Progress Notes (Signed)
Subjective:    Patient ID: Molly Williams, female    DOB: 05/28/1931, 79 y.o.   MRN: [id number]  HPI Molly Williams is here for follow up of UTI and allergic rhinitis    She completed two week course of Cipro and is feeling much better.  Culture grew E coli.  She is using OTC Nasacort for her allergies and states  "they are slowly getting better"  Allergies  Allergen Reactions  . Codeine Nausea And Vomiting  . Iodine Hives    Contrast dye   Past Medical History  Diagnosis Date  . Migraine aura without headache     h/o ocular migraines  . Osteopenia   . Spinal stenosis   . Retinal detachment with break     right/laser  . GERD (gastroesophageal reflux disease)   . Hearing loss sensory, bilateral     bil/hearing aids  . Arthritis   . Urinary tract bacterial infections     has had urethral dilations in past  . Atrophic vaginitis   . Lupus (systemic lupus erythematosus)     Dr. Kellie Simmering  . Hematuria     chronic Dr. Merry Lofty  . Hearing loss   . Fibrocystic breast disease     bx 2008 neg per pt report  . Thyroid disease     hypothyroidism  . Cholelithiasis 2008   Past Surgical History  Procedure Laterality Date  . Cataract extraction w/ intraocular lens  implant, bilateral    . Vaginal hysterectomy      partial  . Cholecystectomy    . Tonsillectomy  1940  . Appendectomy  2006   History   Social History  . Marital Status: Widowed    Spouse Name: N/A    Number of Children: N/A  . Years of Education: N/A   Occupational History  . Not on file.   Social History Main Topics  . Smoking status: Former Games developer  . Smokeless tobacco: Never Used  . Alcohol Use: 6.0 oz/week    10 Glasses of wine per week  . Drug Use: No  . Sexually Active: Not on file   Other Topics Concern  . Not on file   Social History Narrative  . No narrative on file   Family History  Problem Relation Age of Onset  . Stroke Paternal Grandmother   . Stroke Father   . Coronary artery disease  Father   . Coronary artery disease Mother    Patient Active Problem List   Diagnosis Date Noted  . Allergic rhinitis 12/26/2012  . GERD (gastroesophageal reflux disease) 04/21/2011  . Thyroid disease   . Arthritis   . Urinary tract bacterial infections   . Atrophic vaginitis   . Osteopenia   . Spinal stenosis   . Hearing loss   . Fibrocystic breast disease   . Fibrocystic breast disease   . Cholelithiasis   . Hematuria   . Lupus (systemic lupus erythematosus)   . Hearing loss sensory, bilateral    Current Outpatient Prescriptions on File Prior to Visit  Medication Sig Dispense Refill  . acetaminophen (TYLENOL) 500 MG tablet Take 1,000 mg by mouth every 6 (six) hours as needed. pain       . calcium-vitamin D (OS-CAL 500 + D) 500-200 MG-UNIT per tablet Take 1 tablet by mouth 2 (two) times daily.        . diphenhydrAMINE (BENADRYL) 25 mg capsule Take 25 mg by mouth at bedtime as needed.      Marland Kitchen levothyroxine (SYNTHROID,  LEVOTHROID) 25 MCG tablet Take 1 tablet (25 mcg total) by mouth daily.  90 tablet  3  . triamcinolone (NASACORT) 55 MCG/ACT nasal inhaler Place 2 sprays into the nose daily.      . famotidine-calcium carbonate-magnesium hydroxide (PEPCID COMPLETE) 10-800-165 MG CHEW Chew 1 tablet by mouth daily as needed.       No current facility-administered medications on file prior to visit.      Review of Systems See HPI    Objective:   Physical Exam Physical Exam  Nursing note and vitals reviewed.  Constitutional: She is oriented to person, place, and time. She appears well-developed and well-nourished.  HENT:  Head: Normocephalic and atraumatic.  Cardiovascular: Normal rate and regular rhythm. Exam reveals no gallop and no friction rub.  No murmur heard.  Pulmonary/Chest: Breath sounds normal. She has no wheezes. She has no rales.  Neurological: She is alert and oriented to person, place, and time.  Skin: Skin is warm and dry.  Psychiatric: She has a normal mood  and affect. Her behavior is normal.             Assessment & Plan:  UTI  Improved  Allergic rhinitis  Continue Nasacort  See me as needed

## 2013-01-16 NOTE — Patient Instructions (Addendum)
See me as needed 

## 2013-02-21 ENCOUNTER — Ambulatory Visit (INDEPENDENT_AMBULATORY_CARE_PROVIDER_SITE_OTHER): Payer: Medicare Other | Admitting: Internal Medicine

## 2013-02-21 ENCOUNTER — Encounter: Payer: Self-pay | Admitting: Internal Medicine

## 2013-02-21 ENCOUNTER — Telehealth: Payer: Self-pay | Admitting: Internal Medicine

## 2013-02-21 VITALS — BP 101/61 | HR 61 | Temp 98.4°F | Resp 18 | Wt 118.0 lb

## 2013-02-21 DIAGNOSIS — H6120 Impacted cerumen, unspecified ear: Secondary | ICD-10-CM

## 2013-02-21 DIAGNOSIS — H6123 Impacted cerumen, bilateral: Secondary | ICD-10-CM

## 2013-02-21 MED ORDER — DESONIDE 0.05 % EX CREA: TOPICAL_CREAM | Freq: Two times a day (BID) | CUTANEOUS | Status: DC

## 2013-02-21 MED ORDER — METHYLPREDNISOLONE ACETATE 80 MG/ML IJ SUSP
80.0000 mg | Freq: Once | INTRAMUSCULAR | Status: AC
  Administered 2013-02-21: 80 mg via INTRAMUSCULAR

## 2013-02-21 NOTE — Telephone Encounter (Signed)
Pt scheduled with Dr Pollyann Kennedy on Thursday 02/23/2013 at 1:40 pm.  Pt made aware of appt, date, time, & location by office.Molly Williams

## 2013-02-21 NOTE — Progress Notes (Signed)
Subjective:    Patient ID: Molly Williams, female    DOB: [birth year], 79 y.o.   MRN: [id number]  HPI  [name] is here for acute visit.  She has had significant itching in both ears.  She is using Nasacort and OTC antihistamines with minimal relief.     Nasal congestion,   Itching.    Has been using Q-tip to both ears  States hearing has been muffled.    Allergies  Allergen Reactions  . Codeine Nausea And Vomiting  . Iodine Hives    Contrast dye   Past Medical History  Diagnosis Date  . Migraine aura without headache     h/o ocular migraines  . Osteopenia   . Spinal stenosis   . Retinal detachment with break     right/laser  . GERD (gastroesophageal reflux disease)   . Hearing loss sensory, bilateral     bil/hearing aids  . Arthritis   . Urinary tract bacterial infections     has had urethral dilations in past  . Atrophic vaginitis   . Lupus (systemic lupus erythematosus)     Dr. Kellie Simmering  . Hematuria     chronic Dr. Merry Lofty  . Hearing loss   . Fibrocystic breast disease     bx 2008 neg per pt report  . Thyroid disease     hypothyroidism  . Cholelithiasis 2008   Past Surgical History  Procedure Laterality Date  . Cataract extraction w/ intraocular lens  implant, bilateral    . Vaginal hysterectomy      partial  . Cholecystectomy    . Tonsillectomy  1940  . Appendectomy  2006   History   Social History  . Marital Status: Widowed    Spouse Name: N/A    Number of Children: N/A  . Years of Education: N/A   Occupational History  . Not on file.   Social History Main Topics  . Smoking status: Former Games developer  . Smokeless tobacco: Never Used  . Alcohol Use: 6.0 oz/week    10 Glasses of wine per week  . Drug Use: No  . Sexually Active: No   Other Topics Concern  . Not on file   Social History Narrative  . No narrative on file   Family History  Problem Relation Age of Onset  . Stroke Paternal Grandmother   . Stroke Father   . Coronary artery disease  Father   . Coronary artery disease Mother    Patient Active Problem List   Diagnosis Date Noted  . Cerumen impaction 02/21/2013  . Allergic rhinitis 12/26/2012  . GERD (gastroesophageal reflux disease) 04/21/2011  . Thyroid disease   . Arthritis   . Urinary tract bacterial infections   . Atrophic vaginitis   . Osteopenia   . Spinal stenosis   . Hearing loss   . Fibrocystic breast disease   . Fibrocystic breast disease   . Cholelithiasis   . Hematuria   . Lupus (systemic lupus erythematosus)   . Hearing loss sensory, bilateral    Current Outpatient Prescriptions on File Prior to Visit  Medication Sig Dispense Refill  . calcium-vitamin D (OS-CAL 500 + D) 500-200 MG-UNIT per tablet Take 1 tablet by mouth 2 (two) times daily.        . famotidine-calcium carbonate-magnesium hydroxide (PEPCID COMPLETE) 10-800-165 MG CHEW Chew 1 tablet by mouth daily as needed.      Marland Kitchen levothyroxine (SYNTHROID, LEVOTHROID) 25 MCG tablet Take 1 tablet (25 mcg total) by mouth daily.  90 tablet  3  . triamcinolone (NASACORT) 55 MCG/ACT nasal inhaler Place 2 sprays into the nose daily.      Marland Kitchen acetaminophen (TYLENOL) 500 MG tablet Take 1,000 mg by mouth every 6 (six) hours as needed. pain       . diphenhydrAMINE (BENADRYL) 25 mg capsule Take 25 mg by mouth at bedtime as needed.       No current facility-administered medications on file prior to visit.       Review of Systems    see HPI Objective:   Physical Exam Physical Exam  Nursing note and vitals reviewed.  Constitutional: She is oriented to person, place, and time. She appears well-developed and well-nourished.  HENT:  TM"s  Bilateral cerumen impaction  Head: Normocephalic and atraumatic.  Cardiovascular: Normal rate and regular rhythm. Exam reveals no gallop and no friction rub.  No murmur heard.  Pulmonary/Chest: Breath sounds normal. She has no wheezes. She has no rales.  Neurological: She is alert and oriented to person, place, and time.   Skin: Skin is warm and dry.  Psychiatric: She has a normal mood and affect. Her behavior is normal.        Assessment & Plan:  Bilateral  cerumen impaction.  Attempted water irrigation but ear canals very small and difficult to irrigate.  Will refer to ENT  Allergic rhinitis  Will give Depomedrol 80 mg Im in office.  OK to have Desonide .05% to ear canal .  Advised not to use Q-tip

## 2013-03-16 ENCOUNTER — Other Ambulatory Visit: Payer: Self-pay | Admitting: *Deleted

## 2013-03-16 MED ORDER — LEVOTHYROXINE SODIUM 25 MCG PO TABS: 25.0000 ug | ORAL_TABLET | Freq: Every day | ORAL | Status: DC

## 2013-03-16 NOTE — Telephone Encounter (Signed)
Notified pt that she is due for blood work. Explained policy regarding medication refills when labs are due (30 days worth and OV)

## 2013-03-20 ENCOUNTER — Ambulatory Visit (INDEPENDENT_AMBULATORY_CARE_PROVIDER_SITE_OTHER): Payer: Medicare Other | Admitting: Internal Medicine

## 2013-03-20 ENCOUNTER — Encounter: Payer: Self-pay | Admitting: Internal Medicine

## 2013-03-20 VITALS — BP 110/60 | HR 57 | Temp 97.6°F | Resp 16 | Wt 114.0 lb

## 2013-03-20 DIAGNOSIS — M949 Disorder of cartilage, unspecified: Secondary | ICD-10-CM

## 2013-03-20 DIAGNOSIS — M329 Systemic lupus erythematosus, unspecified: Secondary | ICD-10-CM

## 2013-03-20 DIAGNOSIS — E079 Disorder of thyroid, unspecified: Secondary | ICD-10-CM

## 2013-03-20 DIAGNOSIS — M8580 Other specified disorders of bone density and structure, unspecified site: Secondary | ICD-10-CM

## 2013-03-20 DIAGNOSIS — K219 Gastro-esophageal reflux disease without esophagitis: Secondary | ICD-10-CM

## 2013-03-20 LAB — CBC WITH DIFFERENTIAL/PLATELET
Eosinophils Relative: 2 % (ref 0–5)
HCT: 35.5 % — ABNORMAL LOW (ref 36.0–46.0)
Hemoglobin: 12.1 g/dL (ref 12.0–15.0)
Lymphocytes Relative: 14 % (ref 12–46)
MCHC: 34.1 g/dL (ref 30.0–36.0)
MCV: 92.7 fL (ref 78.0–100.0)
Monocytes Absolute: 0.8 10*3/uL (ref 0.1–1.0)
Monocytes Relative: 8 % (ref 3–12)
Neutro Abs: 6.9 10*3/uL (ref 1.7–7.7)
WBC: 9.2 10*3/uL (ref 4.0–10.5)

## 2013-03-20 NOTE — Progress Notes (Signed)
Subjective:    Patient ID: Molly Williams, female    DOB: 1931/05/15, 79 y.o.   MRN: [id number]  HPI Denee is here for follow up.  She has not been seen for wellness exam in over one year  She did see Dr. Pollyann Kennedy who removed her bilateral cerumen impactions.  The itching in her canals has improved with Desonide creme.    Tolerating meds well  Allergies  Allergen Reactions  . Codeine Nausea And Vomiting  . Iodine Hives    Contrast dye   Past Medical History  Diagnosis Date  . Migraine aura without headache     h/o ocular migraines  . Osteopenia   . Spinal stenosis   . Retinal detachment with break     right/laser  . GERD (gastroesophageal reflux disease)   . Hearing loss sensory, bilateral     bil/hearing aids  . Arthritis   . Urinary tract bacterial infections     has had urethral dilations in past  . Atrophic vaginitis   . Lupus (systemic lupus erythematosus)     Dr. Kellie Simmering  . Hematuria     chronic Dr. Merry Lofty  . Hearing loss   . Fibrocystic breast disease     bx 2008 neg per pt report  . Thyroid disease     hypothyroidism  . Cholelithiasis 2008   Past Surgical History  Procedure Laterality Date  . Cataract extraction w/ intraocular lens  implant, bilateral    . Vaginal hysterectomy      partial  . Cholecystectomy    . Tonsillectomy  1940  . Appendectomy  2006   History   Social History  . Marital Status: Widowed    Spouse Name: N/A    Number of Children: N/A  . Years of Education: N/A   Occupational History  . Not on file.   Social History Main Topics  . Smoking status: Former Games developer  . Smokeless tobacco: Never Used  . Alcohol Use: 6.0 oz/week    10 Glasses of wine per week  . Drug Use: No  . Sexually Active: No   Other Topics Concern  . Not on file   Social History Narrative  . No narrative on file   Family History  Problem Relation Age of Onset  . Stroke Paternal Grandmother   . Stroke Father   . Coronary artery disease Father   .  Coronary artery disease Mother    Patient Active Problem List   Diagnosis Date Noted  . Cerumen impaction 02/21/2013  . Allergic rhinitis 12/26/2012  . GERD (gastroesophageal reflux disease) 04/21/2011  . Thyroid disease   . Arthritis   . Urinary tract bacterial infections   . Atrophic vaginitis   . Osteopenia   . Spinal stenosis   . Hearing loss   . Fibrocystic breast disease   . Fibrocystic breast disease   . Cholelithiasis   . Hematuria   . Lupus (systemic lupus erythematosus)   . Hearing loss sensory, bilateral    Current Outpatient Prescriptions on File Prior to Visit  Medication Sig Dispense Refill  . acetaminophen (TYLENOL) 500 MG tablet Take 1,000 mg by mouth every 6 (six) hours as needed. pain       . diphenhydrAMINE (BENADRYL) 25 mg capsule Take 25 mg by mouth at bedtime as needed.      Marland Kitchen levothyroxine (SYNTHROID, LEVOTHROID) 25 MCG tablet Take 1 tablet (25 mcg total) by mouth daily.  90 tablet  3  . loratadine (CLARITIN) 10 MG  tablet Take 10 mg by mouth daily.      Marland Kitchen triamcinolone (NASACORT) 55 MCG/ACT nasal inhaler Place 2 sprays into the nose daily.      . calcium-vitamin D (OS-CAL 500 + D) 500-200 MG-UNIT per tablet Take 1 tablet by mouth 2 (two) times daily.        Marland Kitchen desonide (DESOWEN) 0.05 % cream Apply topically 2 (two) times daily. Apply topically once a day  30 g  0  . famotidine-calcium carbonate-magnesium hydroxide (PEPCID COMPLETE) 10-800-165 MG CHEW Chew 1 tablet by mouth daily as needed.       No current facility-administered medications on file prior to visit.       Review of Systems See HPI    Objective:   Physical Exam  Physical Exam  Nursing note and vitals reviewed.  Constitutional: She is oriented to person, place, and time. She appears well-developed and well-nourished.  HENT:  Head: Normocephalic and atraumatic.  Cardiovascular: Normal rate and regular rhythm. Exam reveals no gallop and no friction rub.  No murmur heard.   Pulmonary/Chest: Breath sounds normal. She has no wheezes. She has no rales.  Neurological: She is alert and oriented to person, place, and time.  Skin: Skin is warm and dry.  Psychiatric: She has a normal mood and affect. Her behavior is normal.            Assessment & Plan:  Hypothyroidsism  Will check today  Ear canal itching  Etiology  Allergic vs sebderm  Improving with Desonide  Continue as needed  Gerd:  Continue meds

## 2013-03-20 NOTE — Patient Instructions (Addendum)
See me for CPE 

## 2013-03-21 LAB — LIPID PANEL
HDL: 100 mg/dL (ref >39)
LDL Cholesterol: 118 mg/dL — ABNORMAL HIGH (ref 0–99)
Triglycerides: 63 mg/dL (ref <150)

## 2013-03-21 LAB — COMPREHENSIVE METABOLIC PANEL
AST: 15 U/L (ref 0–37)
Alkaline Phosphatase: 53 U/L (ref 39–117)
BUN: 18 mg/dL (ref 6–23)
Creat: 1.06 mg/dL (ref 0.50–1.10)
Potassium: 5.1 mEq/L (ref 3.5–5.3)
Total Bilirubin: 0.5 mg/dL (ref 0.3–1.2)

## 2013-03-21 LAB — TSH: TSH: 2.79 u[IU]/mL (ref 0.350–4.500)

## 2013-03-21 LAB — VITAMIN D 25 HYDROXY (VIT D DEFICIENCY, FRACTURES): Vit D, 25-Hydroxy: 29 ng/mL — ABNORMAL LOW (ref 30–89)

## 2013-03-23 ENCOUNTER — Other Ambulatory Visit: Payer: Self-pay | Admitting: *Deleted

## 2013-03-23 ENCOUNTER — Encounter: Payer: Self-pay | Admitting: *Deleted

## 2013-03-23 MED ORDER — LEVOTHYROXINE SODIUM 25 MCG PO TABS: 25.0000 ug | ORAL_TABLET | Freq: Every day | ORAL | Status: DC

## 2013-03-23 NOTE — Telephone Encounter (Signed)
Synthroid sent to wrong pharmacy [facility] to [facility]

## 2013-03-23 NOTE — Telephone Encounter (Signed)
error 

## 2013-05-03 ENCOUNTER — Telehealth: Payer: Self-pay | Admitting: *Deleted

## 2013-05-03 NOTE — Telephone Encounter (Signed)
Advised pt that she may take another dose of miralax at noon per Dr Constance Goltz if she still has no results advised pt to go to [facility]

## 2013-05-04 ENCOUNTER — Telehealth: Payer: Self-pay | Admitting: *Deleted

## 2013-05-04 NOTE — Telephone Encounter (Signed)
Called to check on pt LVM message

## 2013-05-04 NOTE — Telephone Encounter (Signed)
Pt states that she is feeling much better her abd cramping has decreased. Pt wants to continue taking miralax. Advised pt that she can continue to take it once a day.

## 2013-06-27 ENCOUNTER — Ambulatory Visit (INDEPENDENT_AMBULATORY_CARE_PROVIDER_SITE_OTHER): Payer: Medicare Other | Admitting: *Deleted

## 2013-06-27 DIAGNOSIS — Z23 Encounter for immunization: Secondary | ICD-10-CM

## 2013-06-27 NOTE — Progress Notes (Signed)
Patient ID: Molly Williams, female   DOB: Feb 07, 1931, 79 y.o.   MRN: [id number] MS osment is here for her annual flu vaccination. She is felling well today with no fever. VIS given to pt

## 2013-07-10 ENCOUNTER — Ambulatory Visit (INDEPENDENT_AMBULATORY_CARE_PROVIDER_SITE_OTHER): Payer: Medicare Other | Admitting: Internal Medicine

## 2013-07-10 ENCOUNTER — Encounter: Payer: Self-pay | Admitting: Internal Medicine

## 2013-07-10 VITALS — BP 138/58 | HR 64 | Temp 98.3°F | Resp 18 | Wt 113.0 lb

## 2013-07-10 DIAGNOSIS — A499 Bacterial infection, unspecified: Secondary | ICD-10-CM

## 2013-07-10 DIAGNOSIS — B9689 Other specified bacterial agents as the cause of diseases classified elsewhere: Secondary | ICD-10-CM

## 2013-07-10 DIAGNOSIS — N390 Urinary tract infection, site not specified: Secondary | ICD-10-CM

## 2013-07-10 DIAGNOSIS — Z139 Encounter for screening, unspecified: Secondary | ICD-10-CM

## 2013-07-10 LAB — POCT URINALYSIS DIPSTICK
Bilirubin, UA: NEGATIVE
Glucose, UA: NEGATIVE
Nitrite, UA: POSITIVE
Urobilinogen, UA: NEGATIVE

## 2013-07-10 MED ORDER — SULFAMETHOXAZOLE-TRIMETHOPRIM 800-160 MG PO TABS: 1.0000 | ORAL_TABLET | Freq: Two times a day (BID) | ORAL | Status: DC

## 2013-07-10 NOTE — Patient Instructions (Signed)
Schedule 3D mm and dexa at GI    Call office on Thursday

## 2013-07-10 NOTE — Progress Notes (Signed)
Subjective:    Patient ID: Molly Williams, female    DOB: Feb 06, 1931, 79 y.o.   MRN: [id number]  HPI  48 hours dysuria urgency no flank pain no documented fever  Allergies  Allergen Reactions  . Codeine Nausea And Vomiting  . Iodine Hives    Contrast dye   Past Medical History  Diagnosis Date  . Migraine aura without headache     h/o ocular migraines  . Osteopenia   . Spinal stenosis   . Retinal detachment with break     right/laser  . GERD (gastroesophageal reflux disease)   . Hearing loss sensory, bilateral     bil/hearing aids  . Arthritis   . Urinary tract bacterial infections     has had urethral dilations in past  . Atrophic vaginitis   . Lupus (systemic lupus erythematosus)     Dr. Kellie Simmering  . Hematuria     chronic Dr. Merry Lofty  . Hearing loss   . Fibrocystic breast disease     bx 2008 neg per pt report  . Thyroid disease     hypothyroidism  . Cholelithiasis 2008   Past Surgical History  Procedure Laterality Date  . Cataract extraction w/ intraocular lens  implant, bilateral    . Vaginal hysterectomy      partial  . Cholecystectomy    . Tonsillectomy  1940  . Appendectomy  2006   History   Social History  . Marital Status: Widowed    Spouse Name: N/A    Number of Children: N/A  . Years of Education: N/A   Occupational History  . Not on file.   Social History Main Topics  . Smoking status: Former Games developer  . Smokeless tobacco: Never Used  . Alcohol Use: 6.0 oz/week    10 Glasses of wine per week  . Drug Use: No  . Sexual Activity: No   Other Topics Concern  . Not on file   Social History Narrative  . No narrative on file   Family History  Problem Relation Age of Onset  . Stroke Paternal Grandmother   . Stroke Father   . Coronary artery disease Father   . Coronary artery disease Mother    Patient Active Problem List   Diagnosis Date Noted  . Cerumen impaction 02/21/2013  . Allergic rhinitis 12/26/2012  . GERD (gastroesophageal  reflux disease) 04/21/2011  . Thyroid disease   . Arthritis   . Urinary tract bacterial infections   . Atrophic vaginitis   . Osteopenia   . Spinal stenosis   . Hearing loss   . Fibrocystic breast disease   . Fibrocystic breast disease   . Cholelithiasis   . Hematuria   . Lupus (systemic lupus erythematosus)   . Hearing loss sensory, bilateral    Current Outpatient Prescriptions on File Prior to Visit  Medication Sig Dispense Refill  . acetaminophen (TYLENOL) 500 MG tablet Take 1,000 mg by mouth every 6 (six) hours as needed. pain       . calcium-vitamin D (OS-CAL 500 + D) 500-200 MG-UNIT per tablet Take 1 tablet by mouth 2 (two) times daily.        Marland Kitchen desonide (DESOWEN) 0.05 % cream Apply topically 2 (two) times daily. Apply topically once a day  30 g  0  . diphenhydrAMINE (BENADRYL) 25 mg capsule Take 25 mg by mouth at bedtime as needed.      . famotidine-calcium carbonate-magnesium hydroxide (PEPCID COMPLETE) 10-800-165 MG CHEW Chew 1 tablet by mouth  daily as needed.      Marland Kitchen levothyroxine (SYNTHROID, LEVOTHROID) 25 MCG tablet Take 1 tablet (25 mcg total) by mouth daily.  90 tablet  3  . loratadine (CLARITIN) 10 MG tablet Take 10 mg by mouth daily.      Marland Kitchen triamcinolone (NASACORT) 55 MCG/ACT nasal inhaler Place 2 sprays into the nose daily.       No current facility-administered medications on file prior to visit.      Review of Systems    see HPI Objective:   Physical Exam  Physical Exam  Nursing note and vitals reviewed.  Constitutional: She is oriented to person, place, and time. She appears well-developed and well-nourished.  HENT:  Head: Normocephalic and atraumatic.  Cardiovascular: Normal rate and regular rhythm. Exam reveals no gallop and no friction rub.  No murmur heard.  Pulmonary/Chest: Breath sounds normal. She has no wheezes. She has no rales.  Abd soft  No CVA tenderness  +suprapubic tenderness Neurological: She is alert and oriented to person, place, and  time.  Skin: Skin is warm and dry.  Psychiatric: She has a normal mood and affect. Her behavior is normal.             Assessment & Plan:

## 2013-07-11 ENCOUNTER — Other Ambulatory Visit: Payer: Self-pay | Admitting: Internal Medicine

## 2013-07-11 DIAGNOSIS — E2839 Other primary ovarian failure: Secondary | ICD-10-CM

## 2013-07-14 LAB — URINE CULTURE

## 2013-07-17 ENCOUNTER — Telehealth: Payer: Self-pay | Admitting: *Deleted

## 2013-07-17 NOTE — Telephone Encounter (Signed)
Notified pt of urine culture results and advised her to take all abx

## 2013-07-17 NOTE — Telephone Encounter (Signed)
Message copied by Mathews Robinsons on Mon Jul 17, 2013  2:10 PM ------      Message from: Raechel Chute D      Created: Mon Jul 17, 2013  9:06 AM       Karen Kitchens            Call pt and let her know that her urine did grow an E coli bacteria  .  Advise her to be sure to finish antibiotics ------

## 2013-08-15 ENCOUNTER — Ambulatory Visit: Admission: RE | Admit: 2013-08-15 | Payer: Medicare Other | Source: Ambulatory Visit

## 2013-08-15 ENCOUNTER — Telehealth: Payer: Self-pay | Admitting: *Deleted

## 2013-08-15 ENCOUNTER — Other Ambulatory Visit: Payer: Self-pay | Admitting: Internal Medicine

## 2013-08-15 ENCOUNTER — Ambulatory Visit
Admission: RE | Admit: 2013-08-15 | Discharge: 2013-08-15 | Disposition: A | Discharge status: Home / Self Care | Payer: Medicare Other | Source: Ambulatory Visit | Attending: Internal Medicine | Admitting: Internal Medicine

## 2013-08-15 DIAGNOSIS — Z1231 Encounter for screening mammogram for malignant neoplasm of breast: Secondary | ICD-10-CM

## 2013-08-15 DIAGNOSIS — E2839 Other primary ovarian failure: Secondary | ICD-10-CM

## 2013-08-15 NOTE — Telephone Encounter (Signed)
Message copied by Mathews Robinsons on Tue Aug 15, 2013  1:52 PM ------      Message from: Raechel Chute D      Created: Tue Aug 15, 2013  1:28 PM       Call pt and let her know that her bone density shows early thinning  No osteoporosis .  Advise to take daily calcium and vitamin D ------

## 2013-08-17 ENCOUNTER — Telehealth: Payer: Self-pay | Admitting: *Deleted

## 2013-08-17 NOTE — Telephone Encounter (Signed)
Notified pt of bone density results  

## 2013-08-17 NOTE — Telephone Encounter (Signed)
Message copied by Mathews Robinsons on Thu Aug 17, 2013  8:30 AM ------      Message from: Raechel Chute D      Created: Tue Aug 15, 2013  1:28 PM       Call pt and let her know that her bone density shows early thinning  No osteoporosis .  Advise to take daily calcium and vitamin D ------

## 2013-09-24 ENCOUNTER — Telehealth: Payer: Self-pay | Admitting: Internal Medicine

## 2013-09-24 ENCOUNTER — Encounter: Payer: Self-pay | Admitting: Internal Medicine

## 2013-09-24 NOTE — Telephone Encounter (Signed)
Molly Williams  Call pt and let her know that her osteoporosis xray shows worsening this year.  Schedule her a 30 minute office visit with me to discuss  Message back with appt date

## 2013-09-25 NOTE — Telephone Encounter (Signed)
Spoke with patient and gave her bone scan results.  Also made her appointment for a follow up on Wed. 09/27/13.

## 2013-09-27 ENCOUNTER — Encounter: Payer: Self-pay | Admitting: Internal Medicine

## 2013-09-27 ENCOUNTER — Ambulatory Visit (INDEPENDENT_AMBULATORY_CARE_PROVIDER_SITE_OTHER): Payer: Medicare HMO | Admitting: Internal Medicine

## 2013-09-27 VITALS — BP 145/60 | HR 59 | Temp 98.5°F | Resp 18 | Wt 115.0 lb

## 2013-09-27 DIAGNOSIS — T887XXA Unspecified adverse effect of drug or medicament, initial encounter: Secondary | ICD-10-CM

## 2013-09-27 DIAGNOSIS — M810 Age-related osteoporosis without current pathological fracture: Secondary | ICD-10-CM

## 2013-09-27 DIAGNOSIS — G4700 Insomnia, unspecified: Secondary | ICD-10-CM | POA: Insufficient documentation

## 2013-09-27 DIAGNOSIS — T50905A Adverse effect of unspecified drugs, medicaments and biological substances, initial encounter: Secondary | ICD-10-CM

## 2013-09-27 LAB — HEPATIC FUNCTION PANEL
ALT: 10 U/L (ref 0–35)
AST: 18 U/L (ref 0–37)
Albumin: 4.3 g/dL (ref 3.5–5.2)
Alkaline Phosphatase: 65 U/L (ref 39–117)
BILIRUBIN DIRECT: 0.1 mg/dL (ref 0.0–0.3)
BILIRUBIN INDIRECT: 0.4 mg/dL (ref 0.0–0.9)
BILIRUBIN TOTAL: 0.5 mg/dL (ref 0.3–1.2)
Total Protein: 7.1 g/dL (ref 6.0–8.3)

## 2013-09-27 NOTE — Patient Instructions (Signed)
See me as needed 

## 2013-09-27 NOTE — Progress Notes (Signed)
Subjective:    Patient ID: Molly Williams, female    DOB: [birth year], 80 y.o.   MRN: [id number]  HPI Molly Williams is here for follow up  Her Dexa shows osteopenia with Frax score of 3.2% at Hip.  She take calcium and vitamin D and tells me she was on Fosamax for about 18 months but could not tolerate due to extreme muslce aches upper and lower legs.   SHe has seen Dr. Charlestine Night in the past  She reports she has been  Valerian root for sleep for last several months to help with insomnia  Allergies  Allergen Reactions  . Codeine Nausea And Vomiting  . Iodine Hives    Contrast dye   Past Medical History  Diagnosis Date  . Migraine aura without headache     h/o ocular migraines  . Osteopenia   . Spinal stenosis   . Retinal detachment with break     right/laser  . GERD (gastroesophageal reflux disease)   . Hearing loss sensory, bilateral     bil/hearing aids  . Arthritis   . Urinary tract bacterial infections     has had urethral dilations in past  . Atrophic vaginitis   . Lupus (systemic lupus erythematosus)     Dr. Charlestine Night  . Hematuria     chronic Dr. Burnell Blanks  . Hearing loss   . Fibrocystic breast disease     bx 2008 neg per pt report  . Thyroid disease     hypothyroidism  . Cholelithiasis 2008   Past Surgical History  Procedure Laterality Date  . Cataract extraction w/ intraocular lens  implant, bilateral    . Vaginal hysterectomy      partial  . Cholecystectomy    . Tonsillectomy  1940  . Appendectomy  2006   History   Social History  . Marital Status: Widowed    Spouse Name: N/A    Number of Children: N/A  . Years of Education: N/A   Occupational History  . Not on file.   Social History Main Topics  . Smoking status: Former Research scientist (life sciences)  . Smokeless tobacco: Never Used  . Alcohol Use: 6.0 oz/week    10 Glasses of wine per week  . Drug Use: No  . Sexual Activity: No   Other Topics Concern  . Not on file   Social History Narrative  . No narrative on file     Family History  Problem Relation Age of Onset  . Stroke Paternal Grandmother   . Stroke Father   . Coronary artery disease Father   . Coronary artery disease Mother    Patient Active Problem List   Diagnosis Date Noted  . Osteoporosis 09/27/2013  . Cerumen impaction 02/21/2013  . Allergic rhinitis 12/26/2012  . GERD (gastroesophageal reflux disease) 04/21/2011  . Thyroid disease   . Arthritis   . Urinary tract bacterial infections   . Atrophic vaginitis   . Osteopenia   . Spinal stenosis   . Hearing loss   . Fibrocystic breast disease   . Fibrocystic breast disease   . Cholelithiasis   . Hematuria   . Lupus (systemic lupus erythematosus)   . Hearing loss sensory, bilateral    Current Outpatient Prescriptions on File Prior to Visit  Medication Sig Dispense Refill  . calcium-vitamin D (OS-CAL 500 + D) 500-200 MG-UNIT per tablet Take 1 tablet by mouth 2 (two) times daily.       . diphenhydrAMINE (BENADRYL) 25 mg capsule Take 25 mg  by mouth at bedtime as needed.      Marland Kitchen levothyroxine (SYNTHROID, LEVOTHROID) 25 MCG tablet Take 1 tablet (25 mcg total) by mouth daily.  90 tablet  3  . loratadine (CLARITIN) 10 MG tablet Take 10 mg by mouth daily.      Marland Kitchen acetaminophen (TYLENOL) 500 MG tablet Take 1,000 mg by mouth every 6 (six) hours as needed. pain       . desonide (DESOWEN) 0.05 % cream Apply topically 2 (two) times daily. Apply topically once a day  30 g  0  . famotidine-calcium carbonate-magnesium hydroxide (PEPCID COMPLETE) 10-800-165 MG CHEW Chew 1 tablet by mouth daily as needed.      . triamcinolone (NASACORT) 55 MCG/ACT nasal inhaler Place 2 sprays into the nose daily.       No current facility-administered medications on file prior to visit.      Review of Systems See HPI    Objective:   Physical Exam  Physical Exam  Nursing note and vitals reviewed.  Constitutional: She is oriented to person, place, and time. She appears well-developed and well-nourished.   HENT:  Head: Normocephalic and atraumatic.  Cardiovascular: Normal rate and regular rhythm. Exam reveals no gallop and no friction rub.  No murmur heard.  Pulmonary/Chest: Breath sounds normal. She has no wheezes. She has no rales.  Neurological: She is alert and oriented to person, place, and time.  Skin: Skin is warm and dry.  Psychiatric: She has a normal mood and affect. Her behavior is normal.         Assessment & Plan:  Osteopenia with high risk FRAX  She really does not want a bisphosphanate.  Seh wishes to discuss with son who is an FP MD and will also see Dr. Charlestine Night for second opinion  Insomnia  I advised that most commone SE that I see with herbs and supplements in general is liver irritation.  Will check hepatic function today  See me as needed

## 2013-09-29 ENCOUNTER — Encounter: Payer: Self-pay | Admitting: *Deleted

## 2013-10-04 ENCOUNTER — Encounter: Payer: Self-pay | Admitting: *Deleted

## 2013-10-06 ENCOUNTER — Telehealth: Payer: Self-pay | Admitting: *Deleted

## 2013-10-06 NOTE — Telephone Encounter (Signed)
Pt  Has decided to use medications for osteoporosis she does not want fosemax as it caused bad side effects. She is interested in the injections.

## 2013-10-11 ENCOUNTER — Other Ambulatory Visit: Payer: Self-pay | Admitting: Internal Medicine

## 2013-10-11 DIAGNOSIS — M810 Age-related osteoporosis without current pathological fracture: Secondary | ICD-10-CM

## 2013-11-28 ENCOUNTER — Telehealth: Payer: Self-pay | Admitting: *Deleted

## 2013-11-28 NOTE — Telephone Encounter (Signed)
Returned Ms Mangano's call. LVM to return call

## 2013-11-28 NOTE — Telephone Encounter (Signed)
Molly Williams would like you to call her.  She has a question

## 2014-01-01 ENCOUNTER — Other Ambulatory Visit: Payer: Self-pay | Admitting: *Deleted

## 2014-01-01 NOTE — Telephone Encounter (Signed)
Refill request

## 2014-01-02 MED ORDER — LEVOTHYROXINE SODIUM 25 MCG PO TABS: 25.0000 ug | ORAL_TABLET | Freq: Every day | ORAL | Status: DC

## 2014-01-16 ENCOUNTER — Telehealth: Payer: Self-pay | Admitting: *Deleted

## 2014-01-16 ENCOUNTER — Other Ambulatory Visit: Payer: Self-pay | Admitting: *Deleted

## 2014-01-16 MED ORDER — LEVOTHYROXINE SODIUM 25 MCG PO TABS: 25.0000 ug | ORAL_TABLET | Freq: Every day | ORAL | Status: DC

## 2014-01-16 NOTE — Telephone Encounter (Signed)
Notified pt. 

## 2014-07-04 ENCOUNTER — Ambulatory Visit (INDEPENDENT_AMBULATORY_CARE_PROVIDER_SITE_OTHER): Payer: Medicare HMO | Admitting: *Deleted

## 2014-07-04 DIAGNOSIS — Z23 Encounter for immunization: Secondary | ICD-10-CM

## 2014-09-22 ENCOUNTER — Encounter (HOSPITAL_BASED_OUTPATIENT_CLINIC_OR_DEPARTMENT_OTHER): Payer: Self-pay | Admitting: Emergency Medicine

## 2014-09-22 ENCOUNTER — Emergency Department (HOSPITAL_BASED_OUTPATIENT_CLINIC_OR_DEPARTMENT_OTHER)
Admission: EM | Admit: 2014-09-22 | Discharge: 2014-09-22 | Disposition: A | Discharge status: Other Acute Inpatient Hospital | Payer: PPO | Attending: Emergency Medicine | Admitting: Emergency Medicine

## 2014-09-22 DIAGNOSIS — S01502A Unspecified open wound of oral cavity, initial encounter: Secondary | ICD-10-CM | POA: Diagnosis not present

## 2014-09-22 DIAGNOSIS — Y93G3 Activity, cooking and baking: Secondary | ICD-10-CM | POA: Insufficient documentation

## 2014-09-22 DIAGNOSIS — Y929 Unspecified place or not applicable: Secondary | ICD-10-CM | POA: Insufficient documentation

## 2014-09-22 DIAGNOSIS — Y278XXA Contact with other hot objects, undetermined intent, initial encounter: Secondary | ICD-10-CM | POA: Diagnosis not present

## 2014-09-22 DIAGNOSIS — Y998 Other external cause status: Secondary | ICD-10-CM | POA: Diagnosis not present

## 2014-09-22 DIAGNOSIS — K1370 Unspecified lesions of oral mucosa: Secondary | ICD-10-CM

## 2014-09-22 LAB — CBC WITH DIFFERENTIAL/PLATELET
BASOS ABS: 0 10*3/uL (ref 0.0–0.1)
Basophils Relative: 1 % (ref 0–1)
EOS ABS: 0.2 10*3/uL (ref 0.0–0.7)
EOS PCT: 3 % (ref 0–5)
HCT: 35.8 % — ABNORMAL LOW (ref 36.0–46.0)
HEMOGLOBIN: 11.9 g/dL — AB (ref 12.0–15.0)
LYMPHS ABS: 1.8 10*3/uL (ref 0.7–4.0)
LYMPHS PCT: 25 % (ref 12–46)
MCH: 32.5 pg (ref 26.0–34.0)
MCHC: 33.2 g/dL (ref 30.0–36.0)
MCV: 97.8 fL (ref 78.0–100.0)
Monocytes Absolute: 0.8 10*3/uL (ref 0.1–1.0)
Monocytes Relative: 10 % (ref 3–12)
NEUTROS ABS: 4.5 10*3/uL (ref 1.7–7.7)
NEUTROS PCT: 62 % (ref 43–77)
PLATELETS: 285 10*3/uL (ref 150–400)
RBC: 3.66 MIL/uL — ABNORMAL LOW (ref 3.87–5.11)
RDW: 13.1 % (ref 11.5–15.5)
WBC: 7.4 10*3/uL (ref 4.0–10.5)

## 2014-09-22 LAB — BASIC METABOLIC PANEL
Anion gap: 8 (ref 5–15)
BUN: 16 mg/dL (ref 6–23)
CO2: 24 mmol/L (ref 19–32)
Calcium: 9.3 mg/dL (ref 8.4–10.5)
Chloride: 100 mEq/L (ref 96–112)
Creatinine, Ser: 0.78 mg/dL (ref 0.50–1.10)
GFR calc Af Amer: 88 mL/min — ABNORMAL LOW (ref >90)
GFR, EST NON AFRICAN AMERICAN: 76 mL/min — AB (ref >90)
GLUCOSE: 87 mg/dL (ref 70–99)
POTASSIUM: 4.7 mmol/L (ref 3.5–5.1)
Sodium: 132 mmol/L — ABNORMAL LOW (ref 135–145)

## 2014-09-22 LAB — PROTIME-INR
INR: 0.94 (ref 0.00–1.49)
PROTHROMBIN TIME: 12.6 s (ref 11.6–15.2)

## 2014-09-22 MED ORDER — LIDOCAINE-EPINEPHRINE 1 %-1:100000 IJ SOLN
10.0000 mL | Freq: Once | INTRAMUSCULAR | Status: AC
  Administered 2014-09-22: 1 mL
  Filled 2014-09-22: qty 1

## 2014-09-22 NOTE — Discharge Instructions (Signed)
Resume normal diet. Rinse mouth with saltwater after eating and several times each day.  Apply gauze dipped in Neo-Synephrine spray and/or ice water and apply it to the roof of the mouth if there is any further bleeding.

## 2014-09-22 NOTE — ED Provider Notes (Signed)
Patient seen upon her arrival. Has a blood filled bullae on the roof of her mouth, hard palate. I discussed case Dr. Constance Holster. He is here evaluating the patient.  Tanna Furry, MD 09/22/14 2020

## 2014-09-22 NOTE — ED Notes (Signed)
Dr Rosen at bedside 

## 2014-09-22 NOTE — Consult Note (Signed)
Reason for Consult: Oral hemorrhage Referring Physician: Tanna Furry, MD  Molly Williams is an 81 y.o. female.  HPI: She was cooking dinner late this afternoon, and she felt something in the roof of her mouth. She scraped her with her finger and started bleeding profusely for about a hour. She was seen at the med Center at [facility] and had what appeared to be either a blood blister or a granuloma. She was referred here for my evaluation. She is on no anticoagulation. She is otherwise in good health. She does not recall having any trauma to the oral cavity.  Past Medical History  Diagnosis Date  . Migraine aura without headache     h/o ocular migraines  . Osteopenia   . Spinal stenosis   . Retinal detachment with break     right/laser  . GERD (gastroesophageal reflux disease)   . Hearing loss sensory, bilateral     bil/hearing aids  . Arthritis   . Urinary tract bacterial infections     has had urethral dilations in past  . Atrophic vaginitis   . Lupus (systemic lupus erythematosus)     Dr. Charlestine Night  . Hematuria     chronic Dr. Burnell Blanks  . Hearing loss   . Fibrocystic breast disease     bx 2008 neg per pt report  . Thyroid disease     hypothyroidism  . Cholelithiasis 2008    Past Surgical History  Procedure Laterality Date  . Cataract extraction w/ intraocular lens  implant, bilateral    . Vaginal hysterectomy      partial  . Cholecystectomy    . Tonsillectomy  1940  . Appendectomy  2006    Family History  Problem Relation Age of Onset  . Stroke Paternal Grandmother   . Stroke Father   . Coronary artery disease Father   . Coronary artery disease Mother     Social History:  reports that she has quit smoking. She has never used smokeless tobacco. She reports that she drinks about 6.0 oz of alcohol per week. She reports that she does not use illicit drugs.  Allergies:  Allergies  Allergen Reactions  . Codeine Nausea And Vomiting  . Iodine Hives    Contrast dye     Medications: Reviewed  Results for orders placed or performed during the hospital encounter of 09/22/14 (from the past 48 hour(s))  CBC with Differential     Status: Abnormal   Collection Time: 09/22/14  4:00 PM  Result Value Ref Range   WBC 7.4 4.0 - 10.5 K/uL   RBC 3.66 (L) 3.87 - 5.11 MIL/uL   Hemoglobin 11.9 (L) 12.0 - 15.0 g/dL   HCT 35.8 (L) 36.0 - 46.0 %   MCV 97.8 78.0 - 100.0 fL   MCH 32.5 26.0 - 34.0 pg   MCHC 33.2 30.0 - 36.0 g/dL   RDW 13.1 11.5 - 15.5 %   Platelets 285 150 - 400 K/uL   Neutrophils Relative % 62 43 - 77 %   Neutro Abs 4.5 1.7 - 7.7 K/uL   Lymphocytes Relative 25 12 - 46 %   Lymphs Abs 1.8 0.7 - 4.0 K/uL   Monocytes Relative 10 3 - 12 %   Monocytes Absolute 0.8 0.1 - 1.0 K/uL   Eosinophils Relative 3 0 - 5 %   Eosinophils Absolute 0.2 0.0 - 0.7 K/uL   Basophils Relative 1 0 - 1 %   Basophils Absolute 0.0 0.0 - 0.1 K/uL  Basic metabolic panel     Status: Abnormal   Collection Time: 09/22/14  4:00 PM  Result Value Ref Range   Sodium 132 (L) 135 - 145 mmol/L    Comment: Please note change in reference range.   Potassium 4.7 3.5 - 5.1 mmol/L    Comment: Please note change in reference range.   Chloride 100 96 - 112 mEq/L   CO2 24 19 - 32 mmol/L   Glucose, Bld 87 70 - 99 mg/dL   BUN 16 6 - 23 mg/dL   Creatinine, Ser 0.78 0.50 - 1.10 mg/dL   Calcium 9.3 8.4 - 10.5 mg/dL   GFR calc non Af Amer 76 (L) >90 mL/min   GFR calc Af Amer 88 (L) >90 mL/min    Comment: (NOTE) The eGFR has been calculated using the CKD EPI equation. This calculation has not been validated in all clinical situations. eGFR's persistently <90 mL/min signify possible Chronic Kidney Disease.    Anion gap 8 5 - 15  Protime-INR     Status: None   Collection Time: 09/22/14  4:00 PM  Result Value Ref Range   Prothrombin Time 12.6 11.6 - 15.2 seconds   INR 0.94 0.00 - 1.49    No results found.  OYD:XAJOINOM except as listed in admit H&P  Blood pressure 159/76, pulse 72,  temperature 97.9 F (36.6 C), temperature source Oral, resp. rate 22, height '5\' 4"'  (1.626 m), weight 50.803 kg (112 lb), SpO2 98 %.  PHYSICAL EXAM: Overall appearance:  Healthy appearing, in no distress Head:  Normocephalic, atraumatic. Oral cavity pharynx: There is a 2-1/2 cm hemorrhagic blister involving the midline posterior hard palate. Using a Frazier suction, I removed the overlying epithelial lining and evacuated the blood. The underlying tissue is somewhat granulomatous but there was no active bleeding elicited. Neck: No palpable neck masses.  Studies Reviewed: none  Procedures: none   Assessment/Plan: Hemorrhagic blister, most likely traumatic in origin. I have evacuated the blood and scraped the underlying tissue and there is no further bleeding. This should heal within 10 days and should not cause any further problems. I provided some Neo-Synephrine spray and some 4 x 4 gauze for her to use in case there is further bleeding and she can contact me again.  [name] 09/22/2014, 7:35 PM

## 2014-09-22 NOTE — ED Notes (Signed)
Combined ENT tray at bedside.

## 2014-09-22 NOTE — ED Notes (Signed)
Pt complaints of mouth bleeding for the past hour.

## 2014-09-22 NOTE — ED Provider Notes (Signed)
CSN: [id number]     Arrival date & time 09/22/14  1407 History  This chart was scribe for Molly Williams, * by Judithann Sauger, ED Scribe. The patient was seen in room MH04/MH04 and the patient's care was started at 3:31 PM.    Chief Complaint  Patient presents with  . Mouth Injury   The history is provided by the patient. No language interpreter was used.   HPI Comments: Molly Williams is a 81 y.o. female who presents to the Emergency Department status post mouth injury that occurred PTA. She explains that she was cooking stew and she tasted the stew but did not feel any burning sensation while tasting the stew. Shortly after eating, she started experiencing her symptoms. She reports associated mouth bleeding, lumps and blisters in the mouth. She denies being on blood thinners or bleeding easily.   Past Medical History  Diagnosis Date  . Migraine aura without headache     h/o ocular migraines  . Osteopenia   . Spinal stenosis   . Retinal detachment with break     right/laser  . GERD (gastroesophageal reflux disease)   . Hearing loss sensory, bilateral     bil/hearing aids  . Arthritis   . Urinary tract bacterial infections     has had urethral dilations in past  . Atrophic vaginitis   . Lupus (systemic lupus erythematosus)     Dr. Charlestine Night  . Hematuria     chronic Dr. Burnell Blanks  . Hearing loss   . Fibrocystic breast disease     bx 2008 neg per pt report  . Thyroid disease     hypothyroidism  . Cholelithiasis 2008   Past Surgical History  Procedure Laterality Date  . Cataract extraction w/ intraocular lens  implant, bilateral    . Vaginal hysterectomy      partial  . Cholecystectomy    . Tonsillectomy  1940  . Appendectomy  2006   Family History  Problem Relation Age of Onset  . Stroke Paternal Grandmother   . Stroke Father   . Coronary artery disease Father   . Coronary artery disease Mother    History  Substance Use Topics  . Smoking status: Former  Research scientist (life sciences)  . Smokeless tobacco: Never Used  . Alcohol Use: 6.0 oz/week    10 Glasses of wine per week   OB History    No data available     Review of Systems  Constitutional: Negative for fever.  HENT: Positive for mouth sores.   Hematological: Does not bruise/bleed easily.  All other systems reviewed and are negative.     Allergies  Codeine and Iodine  Home Medications   Prior to Admission medications   Medication Sig Start Date End Date Taking? Authorizing Provider  famotidine-calcium carbonate-magnesium hydroxide (PEPCID COMPLETE) 10-800-165 MG CHEW Chew 1 tablet by mouth daily as needed.   Yes Historical Provider, MD  levothyroxine (SYNTHROID, LEVOTHROID) 25 MCG tablet Take 1 tablet (25 mcg total) by mouth daily. 01/16/14  Yes Lanice Shirts, MD  acetaminophen (TYLENOL) 500 MG tablet Take 1,000 mg by mouth every 6 (six) hours as needed. pain     Historical Provider, MD  calcium-vitamin D (OS-CAL 500 + D) 500-200 MG-UNIT per tablet Take 1 tablet by mouth 2 (two) times daily.     Historical Provider, MD  desonide (DESOWEN) 0.05 % cream Apply topically 2 (two) times daily. Apply topically once a day 02/21/13   Lanice Shirts, MD  diphenhydrAMINE (BENADRYL)  25 mg capsule Take 25 mg by mouth at bedtime as needed.    Historical Provider, MD  loratadine (CLARITIN) 10 MG tablet Take 10 mg by mouth daily.    Historical Provider, MD  OVER THE COUNTER MEDICATION Take 1 tablet by mouth at bedtime as needed ([place]napure).    Historical Provider, MD  triamcinolone (NASACORT) 55 MCG/ACT nasal inhaler Place 2 sprays into the nose daily.    Historical Provider, MD   Pulse 80  Temp(Src) 98.1 F (36.7 C) (Oral)  Resp 18  Ht 5\' 4"  (1.626 m)  Wt 112 lb (50.803 kg)  BMI 19.22 kg/m2  SpO2 99% Physical Exam  Constitutional: She is oriented to person, place, and time. She appears well-developed and well-nourished. No distress.  HENT:  Head: Normocephalic and atraumatic.  Right Ear:  Hearing normal.  Left Ear: Hearing normal.  Nose: Nose normal.  Mouth/Throat: Oropharynx is clear and moist and mucous membranes are normal.  Eyes: Conjunctivae and EOM are normal. Pupils are equal, round, and reactive to light.  Neck: Normal range of motion. Neck supple.  Cardiovascular: Regular rhythm, S1 normal and S2 normal.  Exam reveals no gallop and no friction rub.   No murmur heard. Pulmonary/Chest: Effort normal and breath sounds normal. No respiratory distress. She exhibits no tenderness.  Abdominal: Soft. Normal appearance and bowel sounds are normal. There is no hepatosplenomegaly. There is no tenderness. There is no rebound, no guarding, no tenderness at McBurney's point and negative Murphy's sign. No hernia.  Musculoskeletal: Normal range of motion.  Neurological: She is alert and oriented to person, place, and time. She has normal strength. No cranial nerve deficit or sensory deficit. Coordination normal. GCS eye subscore is 4. GCS verbal subscore is 5. GCS motor subscore is 6.  Skin: Skin is warm, dry and intact. No rash noted. No cyanosis.  Psychiatric: She has a normal mood and affect. Her speech is normal and behavior is normal. Thought content normal.  Nursing note and vitals reviewed.      ED Course  Procedures (including critical care time) DIAGNOSTIC STUDIES: Oxygen Saturation is 99% on RA, normal by my interpretation.    COORDINATION OF CARE: 3:36 PM- Pt advised of plan for treatment and pt agrees.    Labs Review Labs Reviewed  CBC WITH DIFFERENTIAL - Abnormal; Notable for the following:    RBC 3.66 (*)    Hemoglobin 11.9 (*)    HCT 35.8 (*)    All other components within normal limits  BASIC METABOLIC PANEL - Abnormal; Notable for the following:    Sodium 132 (*)    GFR calc non Af Amer 76 (*)    GFR calc Af Amer 88 (*)    All other components within normal limits  PROTIME-INR    Imaging Review No results found.   EKG Interpretation None       MDM   Final diagnoses:  Mouth lesion   She presents to the ER for evaluation of bleeding from her mouth. Patient reports spontaneous bleeding from her mouth that occurred earlier today. She reports that it bled for approximately an hour, a large amount of blood. She denies any injury to the area. Visualization reveals a large bulla-like structure on the posterior aspect of the hard palate that appears to be filled with blood. It is not actively bleeding.  Discussed Dr. Constance Holster, ENT. Patient will be transferred to [facility] for him to evaluate and further treat. Patient is appropriate for transportation by her  daughter.  I personally performed the services described in this documentation, which was scribed in my presence. The recorded information has been reviewed and is accurate.    Molly Greek, MD 09/22/14 1655

## 2014-10-23 ENCOUNTER — Other Ambulatory Visit: Payer: Self-pay | Admitting: *Deleted

## 2014-10-23 DIAGNOSIS — Z0000 Encounter for general adult medical examination without abnormal findings: Secondary | ICD-10-CM

## 2014-10-24 LAB — COMPREHENSIVE METABOLIC PANEL
ALBUMIN: 3.9 g/dL (ref 3.5–5.2)
ALT: 9 U/L (ref 0–35)
AST: 16 U/L (ref 0–37)
Alkaline Phosphatase: 55 U/L (ref 39–117)
BUN: 17 mg/dL (ref 6–23)
CALCIUM: 9.4 mg/dL (ref 8.4–10.5)
CHLORIDE: 102 meq/L (ref 96–112)
CO2: 24 meq/L (ref 19–32)
Creat: 0.99 mg/dL (ref 0.50–1.10)
Glucose, Bld: 81 mg/dL (ref 70–99)
Potassium: 4.6 mEq/L (ref 3.5–5.3)
SODIUM: 138 meq/L (ref 135–145)
TOTAL PROTEIN: 6.6 g/dL (ref 6.0–8.3)
Total Bilirubin: 0.6 mg/dL (ref 0.2–1.2)

## 2014-10-24 LAB — CBC
HCT: 34.2 % — ABNORMAL LOW (ref 36.0–46.0)
HEMOGLOBIN: 11.6 g/dL — AB (ref 12.0–15.0)
MCH: 32.3 pg (ref 26.0–34.0)
MCHC: 33.9 g/dL (ref 30.0–36.0)
MCV: 95.3 fL (ref 78.0–100.0)
MPV: 10.3 fL (ref 8.6–12.4)
Platelets: 314 10*3/uL (ref 150–400)
RBC: 3.59 MIL/uL — AB (ref 3.87–5.11)
RDW: 13.5 % (ref 11.5–15.5)
WBC: 4.4 10*3/uL (ref 4.0–10.5)

## 2014-10-24 LAB — LIPID PANEL
Cholesterol: 235 mg/dL — ABNORMAL HIGH (ref 0–200)
HDL: 89 mg/dL (ref >39)
LDL CALC: 130 mg/dL — AB (ref 0–99)
Total CHOL/HDL Ratio: 2.6 Ratio
Triglycerides: 80 mg/dL (ref <150)
VLDL: 16 mg/dL (ref 0–40)

## 2014-10-24 LAB — TSH: TSH: 3.975 u[IU]/mL (ref 0.350–4.500)

## 2014-10-25 ENCOUNTER — Ambulatory Visit (INDEPENDENT_AMBULATORY_CARE_PROVIDER_SITE_OTHER): Payer: PPO | Admitting: Internal Medicine

## 2014-10-25 ENCOUNTER — Encounter: Payer: Self-pay | Admitting: Internal Medicine

## 2014-10-25 ENCOUNTER — Encounter: Payer: Self-pay | Admitting: *Deleted

## 2014-10-25 VITALS — BP 138/60 | HR 73 | Resp 16 | Ht 62.5 in | Wt 116.0 lb

## 2014-10-25 DIAGNOSIS — Z0000 Encounter for general adult medical examination without abnormal findings: Secondary | ICD-10-CM

## 2014-10-25 DIAGNOSIS — Z1211 Encounter for screening for malignant neoplasm of colon: Secondary | ICD-10-CM

## 2014-10-25 DIAGNOSIS — Z01812 Encounter for preprocedural laboratory examination: Secondary | ICD-10-CM

## 2014-10-25 DIAGNOSIS — M329 Systemic lupus erythematosus, unspecified: Secondary | ICD-10-CM

## 2014-10-25 DIAGNOSIS — M8580 Other specified disorders of bone density and structure, unspecified site: Secondary | ICD-10-CM

## 2014-10-25 DIAGNOSIS — E079 Disorder of thyroid, unspecified: Secondary | ICD-10-CM

## 2014-10-25 LAB — POCT URINALYSIS DIPSTICK
Bilirubin, UA: NEGATIVE
Blood, UA: NEGATIVE
Glucose, UA: NEGATIVE
Ketones, UA: NEGATIVE
LEUKOCYTES UA: NEGATIVE
NITRITE UA: NEGATIVE
PH UA: 6.5
PROTEIN UA: NEGATIVE
Spec Grav, UA: 1.01
Urobilinogen, UA: NEGATIVE

## 2014-10-25 LAB — VITAMIN D 25 HYDROXY (VIT D DEFICIENCY, FRACTURES): Vit D, 25-Hydroxy: 22 ng/mL — ABNORMAL LOW (ref 30–100)

## 2014-10-25 LAB — HEMOCCULT GUIAC POC 1CARD (OFFICE): Fecal Occult Blood, POC: NEGATIVE

## 2014-10-25 MED ORDER — LEVOTHYROXINE SODIUM 25 MCG PO TABS: 25.0000 ug | ORAL_TABLET | Freq: Every day | ORAL | Status: DC

## 2014-10-25 NOTE — Progress Notes (Signed)
Subjective:    Patient ID: Molly Williams, female    DOB: 01-23-1931, 81 y.o.   MRN: [id number]  HPI  09/2013 note Assessment & Plan:  Osteopenia with high risk FRAX She really does not want a bisphosphanate. Seh wishes to discuss with son who is an FP MD and will also see Dr. Charlestine Night for second opinion  Insomnia I advised that most commone SE that I see with herbs and supplements in general is liver irritation. Will check hepatic function today  See me as needed       TODAY    Molly Williams is here for CPE  HM:  She is due for mm,  S/P  Hysterectomy,  10 pack-year smoker quit smoking in 1970's  Colonoscopy done 2012   Problem list reveiwed   She tells me she has not been taking Vitamin D  Allergies  Allergen Reactions  . Codeine Nausea And Vomiting  . Iodine Hives    Contrast dye   Past Medical History  Diagnosis Date  . Migraine aura without headache     h/o ocular migraines  . Osteopenia   . Spinal stenosis   . Retinal detachment with break     right/laser  . GERD (gastroesophageal reflux disease)   . Hearing loss sensory, bilateral     bil/hearing aids  . Arthritis   . Urinary tract bacterial infections     has had urethral dilations in past  . Atrophic vaginitis   . Lupus (systemic lupus erythematosus)     Dr. Charlestine Night  . Hematuria     chronic Dr. Burnell Blanks  . Hearing loss   . Fibrocystic breast disease     bx 2008 neg per pt report  . Thyroid disease     hypothyroidism  . Cholelithiasis 2008   Past Surgical History  Procedure Laterality Date  . Cataract extraction w/ intraocular lens  implant, bilateral    . Vaginal hysterectomy      partial  . Cholecystectomy    . Tonsillectomy  1940  . Appendectomy  2006   History   Social History  . Marital Status: Widowed    Spouse Name: N/A  . Number of Children: N/A  . Years of Education: N/A   Occupational History  . Not on file.   Social History Main Topics  . Smoking status: Former Research scientist (life sciences)    . Smokeless tobacco: Never Used  . Alcohol Use: 6.0 oz/week    10 Glasses of wine per week  . Drug Use: No  . Sexual Activity: No   Other Topics Concern  . Not on file   Social History Narrative   Family History  Problem Relation Age of Onset  . Stroke Paternal Grandmother   . Stroke Father   . Coronary artery disease Father   . Coronary artery disease Mother    Patient Active Problem List   Diagnosis Date Noted  . Osteoporosis 09/27/2013  . Insomnia 09/27/2013  . Cerumen impaction 02/21/2013  . Allergic rhinitis 12/26/2012  . GERD (gastroesophageal reflux disease) 04/21/2011  . Thyroid disease   . Arthritis   . Urinary tract bacterial infections   . Atrophic vaginitis   . Osteopenia   . Spinal stenosis   . Hearing loss   . Fibrocystic breast disease   . Fibrocystic breast disease   . Cholelithiasis   . Hematuria   . Lupus (systemic lupus erythematosus)   . Hearing loss sensory, bilateral    Current Outpatient Prescriptions on  File Prior to Visit  Medication Sig Dispense Refill  . acetaminophen (TYLENOL) 500 MG tablet Take 1,000 mg by mouth every 6 (six) hours as needed. pain     . calcium-vitamin D (OS-CAL 500 + D) 500-200 MG-UNIT per tablet Take 1 tablet by mouth 2 (two) times daily.     Marland Kitchen desonide (DESOWEN) 0.05 % cream Apply topically 2 (two) times daily. Apply topically once a day 30 g 0  . diphenhydrAMINE (BENADRYL) 25 mg capsule Take 25 mg by mouth at bedtime as needed.    . famotidine-calcium carbonate-magnesium hydroxide (PEPCID COMPLETE) 10-800-165 MG CHEW Chew 1 tablet by mouth daily as needed.    Marland Kitchen levothyroxine (SYNTHROID, LEVOTHROID) 25 MCG tablet Take 1 tablet (25 mcg total) by mouth daily. 30 tablet 0  . loratadine (CLARITIN) 10 MG tablet Take 10 mg by mouth daily.    Marland Kitchen OVER THE COUNTER MEDICATION Take 1 tablet by mouth at bedtime as needed (Somnapure).    . triamcinolone (NASACORT) 55 MCG/ACT nasal inhaler Place 2 sprays into the nose daily.      No current facility-administered medications on file prior to visit.      Review of Systems  Respiratory: Negative for cough, choking, chest tightness and wheezing.   Cardiovascular: Negative for chest pain, palpitations and leg swelling.  All other systems reviewed and are negative.      Objective:   Physical Exam Physical Exam  Nursing note and vitals reviewed.  Constitutional: She is oriented to person, place, and time. She appears well-developed and well-nourished.  HENT:  Head: Normocephalic and atraumatic.  Right Ear: Tympanic membrane and ear canal normal. No drainage. Tympanic membrane is not injected and not erythematous.  Left Ear: Tympanic membrane and ear canal normal. No drainage. Tympanic membrane is not injected and not erythematous.  Nose: Nose normal. Right sinus exhibits no maxillary sinus tenderness and no frontal sinus tenderness. Left sinus exhibits no maxillary sinus tenderness and no frontal sinus tenderness.  Mouth/Throat: Oropharynx is clear and moist. No oral lesions. No oropharyngeal exudate.  Eyes: Conjunctivae and EOM are normal. Pupils are equal, round, and reactive to light.  Neck: Normal range of motion. Neck supple. No JVD present. Carotid bruit is not present. No mass and no thyromegaly present.  Cardiovascular: Normal rate, regular rhythm, S1 normal, S2 normal and intact distal pulses. Exam reveals no gallop and no friction rub.  No murmur heard.  Pulses:  Carotid pulses are 2+ on the right side, and 2+ on the left side.  Dorsalis pedis pulses are 2+ on the right side, and 2+ on the left side.  No carotid bruit. No LE edema  Pulmonary/Chest: Breath sounds normal. She has no wheezes. She has no rales. She exhibits no tenderness.   Breast no discrete mass no nipple discharge no axillary adenopathy bilaterally Abdominal: Soft. Bowel sounds are normal. She exhibits no distension and no mass. There is no hepatosplenomegaly. There is no tenderness.  There is no CVA tenderness.  Rectal no mass guaiac neg Musculoskeletal: Normal range of motion.  No active synovitis to joints.  Lymphadenopathy:  She has no cervical adenopathy.  She has no axillary adenopathy.  Right: No inguinal and no supraclavicular adenopathy present.  Left: No inguinal and no supraclavicular adenopathy present.  Neurological: She is alert and oriented to person, place, and time. She has normal strength and normal reflexes. She displays no tremor. No cranial nerve deficit or sensory deficit. Coordination and gait normal.  Skin: Skin is warm  and dry. No rash noted. No cyanosis. Nails show no clubbing.  Psychiatric: She has a normal mood and affect. Her speech is normal and behavior is normal. Cognition and memory are normal.           Assessment & Plan:  HM will schedule 3D mm,   Educated lung ca screening  Pt does not meet criteria  Will order Prevnar  Hypothyroidism  TSH euthyroid  H/O lupus  Manage dby rheumatology  Dr. Charlestine Night  Osteopenia with high 10 year risk  Does not want bisphosphanates she wishes to discuss with rheumatologist    Vitamin D deficiency   Advised to take supplement daily  GERD  Continue pepcid

## 2014-11-20 ENCOUNTER — Other Ambulatory Visit: Payer: Self-pay | Admitting: Internal Medicine

## 2014-11-20 DIAGNOSIS — Z1231 Encounter for screening mammogram for malignant neoplasm of breast: Secondary | ICD-10-CM

## 2014-12-06 ENCOUNTER — Ambulatory Visit
Admission: RE | Admit: 2014-12-06 | Discharge: 2014-12-06 | Disposition: A | Discharge status: Home / Self Care | Payer: PPO | Source: Ambulatory Visit | Attending: Internal Medicine | Admitting: Internal Medicine

## 2014-12-06 DIAGNOSIS — Z1231 Encounter for screening mammogram for malignant neoplasm of breast: Secondary | ICD-10-CM

## 2015-01-06 NOTE — Progress Notes (Signed)
Subjective:    Patient ID: Molly Williams, female    DOB: [birth year], 81 y.o.   MRN: [id number]  HPI  10/2014 note Assessment & Plan:  HM will schedule 3D mm, Educated lung ca screening Pt does not meet criteria Will order Prevnar  Hypothyroidism TSH euthyroid  H/O lupus Manage dby rheumatology Dr. Charlestine Night  Osteopenia with high 10 year risk Does not want bisphosphanates she wishes to discuss with rheumatologist   Vitamin D deficiency Advised to take supplement daily  GERD Continue pepcid         TODAY  Molly Williams is here for acute visit.         She reports she has been treated for a recent tick bite.  Seen by her dermatologist and given Doxycycline which caused hives and then was changed to Amoxicillin which she completed.  She has healing punch Bx site left shoulder.   No arthralgias no headache no fever   She reports vaginal dryness  No itching no vaginal discharge Vagifem has worked for her in the past but too expensive  She is not sexually active   Allergies  Allergen Reactions  . Bacitracin Swelling    Ophthalmic ointment  . Codeine Nausea And Vomiting  . Iodine Hives    Contrast dye   Past Medical History  Diagnosis Date  . Migraine aura without headache     h/o ocular migraines  . Osteopenia   . Spinal stenosis   . Retinal detachment with break     right/laser  . GERD (gastroesophageal reflux disease)   . Hearing loss sensory, bilateral     bil/hearing aids  . Arthritis   . Urinary tract bacterial infections     has had urethral dilations in past  . Atrophic vaginitis   . Lupus (systemic lupus erythematosus)     Dr. Charlestine Night  . Hematuria     chronic Dr. Burnell Blanks  . Hearing loss   . Fibrocystic breast disease     bx 2008 neg per pt report  . Thyroid disease     hypothyroidism  . Cholelithiasis 2008   Past Surgical History  Procedure Laterality Date  . Cataract extraction w/ intraocular lens  implant, bilateral    . Vaginal  hysterectomy      partial  . Cholecystectomy    . Tonsillectomy  1940  . Appendectomy  2006   History   Social History  . Marital Status: Widowed    Spouse Name: N/A  . Number of Children: N/A  . Years of Education: N/A   Occupational History  . Not on file.   Social History Main Topics  . Smoking status: Former Research scientist (life sciences)  . Smokeless tobacco: Never Used  . Alcohol Use: 6.0 oz/week    10 Glasses of wine per week  . Drug Use: No  . Sexual Activity: No   Other Topics Concern  . Not on file   Social History Narrative   Family History  Problem Relation Age of Onset  . Stroke Paternal Grandmother   . Stroke Father   . Coronary artery disease Father   . Coronary artery disease Mother    Patient Active Problem List   Diagnosis Date Noted  . Osteoporosis 09/27/2013  . Insomnia 09/27/2013  . Cerumen impaction 02/21/2013  . Allergic rhinitis 12/26/2012  . GERD (gastroesophageal reflux disease) 04/21/2011  . Thyroid disease   . Arthritis   . Urinary tract bacterial infections   . Atrophic vaginitis   .  Osteopenia   . Spinal stenosis   . Hearing loss   . Fibrocystic breast disease   . Fibrocystic breast disease   . Cholelithiasis   . Hematuria   . Lupus (systemic lupus erythematosus)   . Hearing loss sensory, bilateral    Current Outpatient Prescriptions on File Prior to Visit  Medication Sig Dispense Refill  . acetaminophen (TYLENOL) 500 MG tablet Take 1,000 mg by mouth every 6 (six) hours as needed. pain     . famotidine-calcium carbonate-magnesium hydroxide (PEPCID COMPLETE) 10-800-165 MG CHEW Chew 1 tablet by mouth daily as needed.    Marland Kitchen guaiFENesin (MUCINEX) 600 MG 12 hr tablet Take by mouth 2 (two) times daily.    Marland Kitchen levothyroxine (SYNTHROID, LEVOTHROID) 25 MCG tablet Take 1 tablet (25 mcg total) by mouth daily. 90 tablet 2  . Naproxen Sodium 220 MG CAPS Take by mouth.    . Probiotic Product (PROBIOTIC DAILY PO) Take by mouth.     No current  facility-administered medications on file prior to visit.      Review of Systems    see HPI Objective:   Physical Exam Physical Exam  Nursing note and vitals reviewed.  Constitutional: She is oriented to person, place, and time. She appears well-developed and well-nourished.  HENT:  Head: Normocephalic and atraumatic.  Cardiovascular: Normal rate and regular rhythm. Exam reveals no gallop and no friction rub.  No murmur heard.  Pulmonary/Chest: Breath sounds normal. She has no wheezes. She has no rales.  Neurological: She is alert and oriented to person, place, and time.  Skin: Skin is warm and dry.  Left shoulder  Healing punch biopsy site  Psychiatric: She has a normal mood and affect. Her behavior is normal.              Assessment & Plan:  Atrophic vaginitis  vagifem too expensive ok for Premarin 1 gm vaginally twice a week  Recent tick bite  Managed by derm   She was empiricallly covered for Lyme   Pt received letter of my departure and  Has upcoming appt with new PCP

## 2015-01-07 ENCOUNTER — Ambulatory Visit (INDEPENDENT_AMBULATORY_CARE_PROVIDER_SITE_OTHER): Payer: PPO | Admitting: Internal Medicine

## 2015-01-07 ENCOUNTER — Encounter: Payer: Self-pay | Admitting: Internal Medicine

## 2015-01-07 VITALS — BP 121/49 | HR 63 | Resp 16 | Ht 62.5 in | Wt 115.0 lb

## 2015-01-07 DIAGNOSIS — W57XXXA Bitten or stung by nonvenomous insect and other nonvenomous arthropods, initial encounter: Secondary | ICD-10-CM

## 2015-01-07 DIAGNOSIS — N952 Postmenopausal atrophic vaginitis: Secondary | ICD-10-CM | POA: Diagnosis not present

## 2015-01-07 MED ORDER — ESTROGENS, CONJUGATED 0.625 MG/GM VA CREA: TOPICAL_CREAM | VAGINAL | Status: DC

## 2015-01-30 ENCOUNTER — Telehealth: Payer: Self-pay | Admitting: Medical

## 2015-01-30 ENCOUNTER — Encounter: Payer: Self-pay | Admitting: Medical

## 2015-01-30 ENCOUNTER — Ambulatory Visit (INDEPENDENT_AMBULATORY_CARE_PROVIDER_SITE_OTHER): Payer: PPO | Admitting: Medical

## 2015-01-30 VITALS — BP 116/64 | HR 65 | Temp 98.1°F | Resp 16 | Ht 62.5 in | Wt 112.8 lb

## 2015-01-30 DIAGNOSIS — T148 Other injury of unspecified body region: Secondary | ICD-10-CM

## 2015-01-30 DIAGNOSIS — R631 Polydipsia: Secondary | ICD-10-CM | POA: Diagnosis not present

## 2015-01-30 DIAGNOSIS — R5383 Other fatigue: Secondary | ICD-10-CM | POA: Insufficient documentation

## 2015-01-30 DIAGNOSIS — W57XXXA Bitten or stung by nonvenomous insect and other nonvenomous arthropods, initial encounter: Secondary | ICD-10-CM

## 2015-01-30 DIAGNOSIS — R739 Hyperglycemia, unspecified: Secondary | ICD-10-CM | POA: Insufficient documentation

## 2015-01-30 DIAGNOSIS — R350 Frequency of micturition: Secondary | ICD-10-CM | POA: Insufficient documentation

## 2015-01-30 LAB — POCT URINALYSIS DIPSTICK
Bilirubin, UA: NEGATIVE
Blood, UA: POSITIVE
Glucose, UA: NEGATIVE
Ketones, UA: NEGATIVE
LEUKOCYTES UA: NEGATIVE
NITRITE UA: NEGATIVE
Protein, UA: NEGATIVE
SPEC GRAV UA: 1.015
UROBILINOGEN UA: 4
pH, UA: 6

## 2015-01-30 LAB — POCT CBG MONITORING: BLOOD: 144

## 2015-01-30 NOTE — Assessment & Plan Note (Signed)
Will follow a1-c to see if diabetic. Also will get ua. Will get culture if any indicator of possible infection.

## 2015-01-30 NOTE — Patient Instructions (Signed)
Tick bite Will get tick bite studies. Pt already treated. Partially with doxy then with amoxicillin.   Fatigue Will get cbc and cmp today.   Frequent urination Will follow a1-c to see if diabetic. Also will get ua. Will get culture if any indicator of possible infection.     Will wait on lab results then determine if treatment needed.  Go ahead and start low sugar diet and try to get some mild exercise.   Follow up in 10 days or as needed. Lab results should be in within a couple of day.

## 2015-01-30 NOTE — Telephone Encounter (Signed)
Note pt told me she has chronic blood in her urine for years. She indicates this has been worked up in past. She did mention seeing urologist.

## 2015-01-30 NOTE — Progress Notes (Signed)
Subjective:    Patient ID: Molly Williams, female    DOB: 24-Jan-1931, 81 y.o.   MRN: [id number]  HPI  Pt in states that she has been very thirsty recently for about 2 wks. Some increased frquency of urination. Pt not more hungry. Pt bs today 144. Pt never had diabeties. Pt glucose 3 months ago was 102. Pt does not describe eating a lot of foods with sugar.  She also describes feeling tired just recenlty.   March 14th she pulled tick off her.  Pt was treated with doxycycline only for 3 days  but she get rash which she states were hives. This was to cover tick bite. She had to stop doxycycline. Then was switch to amoxicillin for 2 weeks. Dermatologist treated her. No blood work ever done. Derm did by biospy skin which showed tick head with urticarial reaction.  Pt has no dysuria with frequent urination. Last infection of urine about 3 years ago. None since.   Did review pt history today. New to our office but in system.      Review of Systems  Constitutional: Positive for fatigue. Negative for fever and chills.  HENT: Negative.   Respiratory: Negative for cough, chest tightness and wheezing.   Cardiovascular: Negative for chest pain and palpitations.  Endocrine: Positive for polydipsia and polyuria. Negative for polyphagia.  Genitourinary:       Frequent urination.  Musculoskeletal: Negative for back pain.  Skin: Positive for rash. Negative for pallor.       Lt posterior- shoulder faint red area where tick bit her.  Neurological: Positive for weakness. Negative for dizziness, seizures, syncope, speech difficulty, numbness and headaches.  Hematological: Negative for adenopathy. Does not bruise/bleed easily.  Psychiatric/Behavioral: Negative for confusion and agitation.    Past Medical History  Diagnosis Date  . Migraine aura without headache     h/o ocular migraines  . Osteopenia   . Spinal stenosis   . Retinal detachment with break     right/laser  . GERD  (gastroesophageal reflux disease)   . Hearing loss sensory, bilateral     bil/hearing aids  . Arthritis   . Urinary tract bacterial infections     has had urethral dilations in past  . Atrophic vaginitis   . Lupus (systemic lupus erythematosus)     Dr. Charlestine Night  . Hematuria     chronic Dr. Burnell Blanks  . Hearing loss   . Fibrocystic breast disease     bx 2008 neg per pt report  . Thyroid disease     hypothyroidism  . Cholelithiasis 2008    History   Social History  . Marital Status: Widowed    Spouse Name: N/A  . Number of Children: N/A  . Years of Education: N/A   Occupational History  . Not on file.   Social History Main Topics  . Smoking status: Former Research scientist (life sciences)  . Smokeless tobacco: Never Used  . Alcohol Use: 6.0 oz/week    10 Glasses of wine per week  . Drug Use: No  . Sexual Activity: No   Other Topics Concern  . Not on file   Social History Narrative    Past Surgical History  Procedure Laterality Date  . Cataract extraction w/ intraocular lens  implant, bilateral    . Vaginal hysterectomy      partial  . Cholecystectomy    . Tonsillectomy  1940  . Appendectomy  2006    Family History  Problem Relation Age of  Onset  . Stroke Paternal Grandmother   . Stroke Father   . Coronary artery disease Father   . Coronary artery disease Mother     Allergies  Allergen Reactions  . Bacitracin Swelling    Ophthalmic ointment  . Codeine Nausea And Vomiting  . Iodine Hives    Contrast dye    Current Outpatient Prescriptions on File Prior to Visit  Medication Sig Dispense Refill  . acetaminophen (TYLENOL) 500 MG tablet Take 1,000 mg by mouth every 6 (six) hours as needed. pain     . conjugated estrogens (PREMARIN) vaginal cream Use one gram vaginally two times a week 42.5 g 0  . levothyroxine (SYNTHROID, LEVOTHROID) 25 MCG tablet Take 1 tablet (25 mcg total) by mouth daily. 90 tablet 2  . Naproxen Sodium 220 MG CAPS Take by mouth.    . Probiotic Product  (PROBIOTIC DAILY PO) Take by mouth.     No current facility-administered medications on file prior to visit.    BP 116/64 mmHg  Pulse 65  Temp(Src) 98.1 F (36.7 C) (Oral)  Resp 16  Ht 5' 2.5" (1.588 m)  Wt 112 lb 12.8 oz (51.166 kg)  BMI 20.29 kg/m2  SpO2 98%        Objective:   Physical Exam  General  Mental Status - Alert. General Appearance - Well groomed. Not in acute distress.  Skin Rashes- No Rashes.  HEENT Head- Normal. Ear Auditory Canal - Left- Normal. Right - Normal.Tympanic Membrane- Left- Normal. Right- Normal. Eye Sclera/Conjunctiva- Left- Normal. Right- Normal. Nose & Sinuses Nasal Mucosa- Left-  Not oggy or Congested. Right-  Not  boggy or Congested. Mouth & Throat Lips: Upper Lip- Normal: no dryness, cracking, pallor, cyanosis, or vesicular eruption. Lower Lip-Normal: no dryness, cracking, pallor, cyanosis or vesicular eruption. Buccal Mucosa- Bilateral- No Aphthous ulcers. Oropharynx- No Discharge or Erythema. Tonsils: Characteristics- Bilateral- No Erythema or Congestion. Size/Enlargement- Bilateral- No enlargement. Discharge- bilateral-None.  Neck Neck- Supple. No Masses. No thyromegaly.    Chest and Lung Exam Auscultation: Breath Sounds:- even and unlabored  Cardiovascular Auscultation:Rythm- Regular, rate and rhythm. Murmurs & Other Heart Sounds:Ausculatation of the heart reveal- No Murmurs.  Lymphatic Head & Neck General Head & Neck Lymphatics: Bilateral: Description- No Localized lymphadenopathy.  Back- no cva tenderness  Skin- posterior  Lt shoulder faint small red area. No warmth and not tenderness.   Abdomen Inspection:-Inspection Normal.  Palpation/Perucssion: Palpation and Percussion of the abdomen reveal- Non Tender, No Rebound tenderness, No rigidity(Guarding) and No Palpable abdominal masses.  Liver:-Normal.  Spleen:- Normal.          Assessment & Plan:

## 2015-01-30 NOTE — Progress Notes (Signed)
Pre visit review using our clinic review tool, if applicable. No additional management support is needed unless otherwise documented below in the visit note. 

## 2015-01-30 NOTE — Assessment & Plan Note (Signed)
Will get cbc and cmp today.

## 2015-01-30 NOTE — Assessment & Plan Note (Signed)
Will get tick bite studies. Pt already treated. Partially with doxy then with amoxicillin.

## 2015-02-01 ENCOUNTER — Telehealth: Payer: Self-pay | Admitting: Medical

## 2015-02-01 LAB — URINE CULTURE: Colony Count: 30000

## 2015-02-01 MED ORDER — CIPROFLOXACIN HCL 250 MG PO TABS
250.0000 mg | ORAL_TABLET | Freq: Two times a day (BID) | ORAL | Status: DC
Start: 2015-02-01 — End: 2015-02-12

## 2015-02-07 ENCOUNTER — Telehealth: Payer: Self-pay | Admitting: Family Medicine

## 2015-02-07 ENCOUNTER — Other Ambulatory Visit: Payer: Self-pay | Admitting: *Deleted

## 2015-02-07 ENCOUNTER — Other Ambulatory Visit (INDEPENDENT_AMBULATORY_CARE_PROVIDER_SITE_OTHER): Payer: PPO

## 2015-02-07 DIAGNOSIS — R631 Polydipsia: Secondary | ICD-10-CM | POA: Diagnosis not present

## 2015-02-07 DIAGNOSIS — W57XXXA Bitten or stung by nonvenomous insect and other nonvenomous arthropods, initial encounter: Secondary | ICD-10-CM

## 2015-02-07 DIAGNOSIS — R5383 Other fatigue: Secondary | ICD-10-CM

## 2015-02-07 LAB — COMPREHENSIVE METABOLIC PANEL
ALBUMIN: 4 g/dL (ref 3.5–5.2)
ALT: 12 U/L (ref 0–35)
AST: 20 U/L (ref 0–37)
Alkaline Phosphatase: 65 U/L (ref 39–117)
BUN: 31 mg/dL — ABNORMAL HIGH (ref 6–23)
CO2: 19 meq/L (ref 19–32)
Calcium: 9.1 mg/dL (ref 8.4–10.5)
Chloride: 110 mEq/L (ref 96–112)
Creat: 1.53 mg/dL — ABNORMAL HIGH (ref 0.50–1.10)
Glucose, Bld: 77 mg/dL (ref 70–99)
POTASSIUM: 4.1 meq/L (ref 3.5–5.3)
SODIUM: 139 meq/L (ref 135–145)
TOTAL PROTEIN: 7.2 g/dL (ref 6.0–8.3)
Total Bilirubin: 0.4 mg/dL (ref 0.2–1.2)

## 2015-02-07 NOTE — Telephone Encounter (Signed)
Labs were not drawn that. Orders put in by [facility] today. Called and informed pt. Stated she could come in today. Lab appt scheduled for 1:15 pm. JG//CMA

## 2015-02-07 NOTE — Addendum Note (Signed)
Addended by: Modena Morrow D on: 02/07/2015 02:10 PM   Modules accepted: Orders

## 2015-02-07 NOTE — Addendum Note (Signed)
Addended by: Modena Morrow D on: 02/07/2015 02:39 PM   Modules accepted: Orders

## 2015-02-07 NOTE — Telephone Encounter (Signed)
Caller name: Lamira Borin Relationship to patient: self Can be reached: [phone]  Reason for call: Pt called for tick bite results, CBC, CMP, and A1C from lab work completed 01/30/15 with Percell Miller. Please call her back.

## 2015-02-07 NOTE — Addendum Note (Signed)
Addended by: Modena Morrow D on: 02/07/2015 02:12 PM   Modules accepted: Orders

## 2015-02-07 NOTE — Addendum Note (Signed)
Addended by: Modena Morrow D on: 02/07/2015 02:42 PM   Modules accepted: Orders

## 2015-02-08 ENCOUNTER — Other Ambulatory Visit: Payer: Self-pay | Admitting: Family Medicine

## 2015-02-08 DIAGNOSIS — D6489 Other specified anemias: Secondary | ICD-10-CM

## 2015-02-08 LAB — LYME ABY, WSTRN BLT IGG & IGM W/BANDS
B BURGDORFERI IGM ABS (IB): NEGATIVE
B burgdorferi IgG Abs (IB): NEGATIVE
LYME DISEASE 39 KD IGG: NONREACTIVE
LYME DISEASE 58 KD IGG: NONREACTIVE
Lyme Disease 18 kD IgG: NONREACTIVE
Lyme Disease 23 kD IgG: NONREACTIVE
Lyme Disease 23 kD IgM: NONREACTIVE
Lyme Disease 28 kD IgG: NONREACTIVE
Lyme Disease 30 kD IgG: NONREACTIVE
Lyme Disease 39 kD IgM: NONREACTIVE
Lyme Disease 41 kD IgG: NONREACTIVE
Lyme Disease 41 kD IgM: NONREACTIVE
Lyme Disease 45 kD IgG: NONREACTIVE
Lyme Disease 66 kD IgG: NONREACTIVE
Lyme Disease 93 kD IgG: NONREACTIVE

## 2015-02-08 LAB — CBC WITH DIFFERENTIAL/PLATELET
BASOS PCT: 0.8 % (ref 0.0–3.0)
Basophils Absolute: 0.1 10*3/uL (ref 0.0–0.1)
EOS ABS: 0.4 10*3/uL (ref 0.0–0.7)
Eosinophils Relative: 4.8 % (ref 0.0–5.0)
HEMATOCRIT: 31 % — AB (ref 36.0–46.0)
Hemoglobin: 10.5 g/dL — ABNORMAL LOW (ref 12.0–15.0)
Lymphocytes Relative: 15.2 % (ref 12.0–46.0)
Lymphs Abs: 1.3 10*3/uL (ref 0.7–4.0)
MCHC: 33.8 g/dL (ref 30.0–36.0)
MCV: 95.1 fl (ref 78.0–100.0)
MONOS PCT: 6.4 % (ref 3.0–12.0)
Monocytes Absolute: 0.5 10*3/uL (ref 0.1–1.0)
Neutro Abs: 6 10*3/uL (ref 1.4–7.7)
Neutrophils Relative %: 72.8 % (ref 43.0–77.0)
Platelets: 314 10*3/uL (ref 150.0–400.0)
RBC: 3.26 Mil/uL — AB (ref 3.87–5.11)
RDW: 13.2 % (ref 11.5–15.5)
WBC: 8.2 10*3/uL (ref 4.0–10.5)

## 2015-02-08 LAB — HEMOGLOBIN A1C: HEMOGLOBIN A1C: 5.7 % (ref 4.6–6.5)

## 2015-02-08 LAB — ROCKY MTN SPOTTED FVR AB, IGG-BLOOD: RMSF IgG: 0.09 IV

## 2015-02-08 LAB — ROCKY MTN SPOTTED FVR AB, IGM-BLOOD: ROCKY MTN SPOTTED FEVER, IGM: 1.04 IV — ABNORMAL HIGH

## 2015-02-08 MED ORDER — FERROUS FUMARATE 325 (106 FE) MG PO TABS: 1.0000 | ORAL_TABLET | Freq: Every day | ORAL | Status: DC

## 2015-02-08 NOTE — Telephone Encounter (Signed)
Lab entered and iron sent in to pharmacy

## 2015-02-12 ENCOUNTER — Ambulatory Visit: Payer: PPO | Admitting: Medical

## 2015-02-12 ENCOUNTER — Encounter: Payer: Self-pay | Admitting: Medical

## 2015-02-12 ENCOUNTER — Ambulatory Visit (INDEPENDENT_AMBULATORY_CARE_PROVIDER_SITE_OTHER): Payer: PPO | Admitting: Medical

## 2015-02-12 VITALS — BP 135/76 | HR 80 | Temp 98.4°F | Ht 62.5 in | Wt 116.0 lb

## 2015-02-12 DIAGNOSIS — S30861D Insect bite (nonvenomous) of abdominal wall, subsequent encounter: Secondary | ICD-10-CM

## 2015-02-12 DIAGNOSIS — R748 Abnormal levels of other serum enzymes: Secondary | ICD-10-CM

## 2015-02-12 DIAGNOSIS — W57XXXD Bitten or stung by nonvenomous insect and other nonvenomous arthropods, subsequent encounter: Secondary | ICD-10-CM

## 2015-02-12 DIAGNOSIS — D6489 Other specified anemias: Secondary | ICD-10-CM

## 2015-02-12 DIAGNOSIS — R7989 Other specified abnormal findings of blood chemistry: Secondary | ICD-10-CM

## 2015-02-12 NOTE — Patient Instructions (Addendum)
Follow up lab on 03-11-2015. We will recheck your kidney function, anemia level, b12, intrinsic factor and repeat tick studies at your request.   I want you to take 40 mg of omeprazole a day. If your queezy or nausea sensation persists then consider referal to GI for EGD.  Contiue iron. You note you had queezy sensation before iron use.  Follow up with Dr. Randel Pigg or myself in 5 wks or as needed

## 2015-02-12 NOTE — Progress Notes (Addendum)
Subjective:    Patient ID: Molly Williams, female    DOB: 27-Nov-1930, 81 y.o.   MRN: [id number]  HPI  Fatigue and some anemia. She is now on iron. Will repeat labs in one month.  Today she did feel more energetic after 4 days of the iron. She is tolerating the iron  Also some milld decrease in kidney function.    Pt has tick bite studies that were interpreted as negative. (equivical). Infectious disease thought she had negative results. Reviewed Dr. Randel Pigg note in the chart/labs.   Pt is not diabetic by review of a1-c.   Pt feels some queezy, nausea,  sensation, and lack of appetite last 2-3 weeks. No abdominal pain. Pt is on omeprazole 20 mg q day. Omeprazole stopped her dry cough in the past just recently.  t had low count uti on bacteria count. She was on 3 day course of cipro.No current uti type symptoms.       Review of Systems  Constitutional: Positive for fatigue. Negative for fever and chills.       Mild fatigue.Pt thinks she may be getting better.  Respiratory: Negative for cough, chest tightness and wheezing.   Cardiovascular: Negative for chest pain and palpitations.  Gastrointestinal: Positive for nausea. Negative for diarrhea, constipation, blood in stool, abdominal distention and anal bleeding.       Nausea at time. Lost of appetite.  Genitourinary: Negative.   Musculoskeletal: Negative for back pain.  Neurological: Negative for dizziness, tremors, seizures, numbness and headaches.  Hematological: Negative for adenopathy. Does not bruise/bleed easily.  Psychiatric/Behavioral: Negative for behavioral problems, confusion, self-injury and dysphoric mood. The patient is not nervous/anxious.    Past Medical History  Diagnosis Date  . Migraine aura without headache     h/o ocular migraines  . Osteopenia   . Spinal stenosis   . Retinal detachment with break     right/laser  . GERD (gastroesophageal reflux disease)   . Hearing loss sensory, bilateral    bil/hearing aids  . Arthritis   . Urinary tract bacterial infections     has had urethral dilations in past  . Atrophic vaginitis   . Lupus (systemic lupus erythematosus)     Dr. Charlestine Night  . Hematuria     chronic Dr. Burnell Blanks  . Hearing loss   . Fibrocystic breast disease     bx 2008 neg per pt report  . Thyroid disease     hypothyroidism  . Cholelithiasis 2008    History   Social History  . Marital Status: Widowed    Spouse Name: N/A  . Number of Children: N/A  . Years of Education: N/A   Occupational History  . Not on file.   Social History Main Topics  . Smoking status: Former Research scientist (life sciences)  . Smokeless tobacco: Never Used  . Alcohol Use: 6.0 oz/week    10 Glasses of wine per week  . Drug Use: No  . Sexual Activity: No   Other Topics Concern  . Not on file   Social History Narrative    Past Surgical History  Procedure Laterality Date  . Cataract extraction w/ intraocular lens  implant, bilateral    . Vaginal hysterectomy      partial  . Cholecystectomy    . Tonsillectomy  1940  . Appendectomy  2006    Family History  Problem Relation Age of Onset  . Stroke Paternal Grandmother   . Stroke Father   . Coronary artery disease Father   .  Coronary artery disease Mother     Allergies  Allergen Reactions  . Bacitracin Swelling    Ophthalmic ointment  . Codeine Nausea And Vomiting  . Iodine Hives    Contrast dye    Current Outpatient Prescriptions on File Prior to Visit  Medication Sig Dispense Refill  . acetaminophen (TYLENOL) 500 MG tablet Take 1,000 mg by mouth every 6 (six) hours as needed. pain     . conjugated estrogens (PREMARIN) vaginal cream Use one gram vaginally two times a week 42.5 g 0  . ferrous fumarate (HEMOCYTE - 106 MG FE) 325 (106 FE) MG TABS tablet Take 1 tablet (106 mg of iron total) by mouth daily. 30 each 3  . levothyroxine (SYNTHROID, LEVOTHROID) 25 MCG tablet Take 1 tablet (25 mcg total) by mouth daily. 90 tablet 2  . Naproxen  Sodium 220 MG CAPS Take by mouth.    Marland Kitchen omeprazole (PRILOSEC) 20 MG capsule Take 20 mg by mouth daily.    . Probiotic Product (PROBIOTIC DAILY PO) Take by mouth.     No current facility-administered medications on file prior to visit.    BP 135/76 mmHg  Pulse 80  Temp(Src) 98.4 F (36.9 C) (Oral)  Ht 5' 2.5" (1.588 m)  Wt 116 lb (52.617 kg)  BMI 20.87 kg/m2  SpO2 98%      Objective:   Physical Exam  General- No acute distress. Pleasant patient. Neck- Full range of motion, no jvd Lungs- Clear, even and unlabored. Heart- regular rate and rhythm. Neurologic- CNII- XII grossly intact.   Abdomen Inspection:-Inspection Normal.  Palpation/Perucssion: Palpation and Percussion of the abdomen reveal- Non Tender, No Rebound tenderness, No rigidity(Guarding) and No Palpable abdominal masses.  Liver:-Normal.  Spleen:- Normal.   .       Assessment & Plan:  For her anemia, elevated creatinine, and tick bite as well as Gerd(Below plan)  Follow up lab on 03-11-2015. We will recheck your kidney function, anemia level, b12, intrinsic factor and repeat tick studies at your request.   I want you to take 40 mg of omeprazole a day. If your queezy or nausea sensation persists then consider referal to GI for EGD.  Contiue iron. You note you had queezy sensation before iron use.  Follow up with Dr. Randel Pigg or myself in 5 wks or as needed

## 2015-02-12 NOTE — Progress Notes (Signed)
Pre visit review using our clinic review tool, if applicable. No additional management support is needed unless otherwise documented below in the visit note. 

## 2015-02-18 ENCOUNTER — Other Ambulatory Visit (INDEPENDENT_AMBULATORY_CARE_PROVIDER_SITE_OTHER): Payer: PPO

## 2015-02-18 ENCOUNTER — Other Ambulatory Visit: Payer: Self-pay | Admitting: Family Medicine

## 2015-02-18 ENCOUNTER — Encounter: Payer: Self-pay | Admitting: Family Medicine

## 2015-02-18 DIAGNOSIS — D6489 Other specified anemias: Secondary | ICD-10-CM

## 2015-02-18 LAB — FECAL OCCULT BLOOD, IMMUNOCHEMICAL: FECAL OCCULT BLD: POSITIVE — AB

## 2015-02-18 MED ORDER — OMEPRAZOLE 20 MG PO CPDR: 20.0000 mg | DELAYED_RELEASE_CAPSULE | Freq: Two times a day (BID) | ORAL | Status: DC

## 2015-02-25 ENCOUNTER — Other Ambulatory Visit (INDEPENDENT_AMBULATORY_CARE_PROVIDER_SITE_OTHER): Payer: PPO

## 2015-02-25 DIAGNOSIS — W57XXXD Bitten or stung by nonvenomous insect and other nonvenomous arthropods, subsequent encounter: Secondary | ICD-10-CM | POA: Diagnosis not present

## 2015-02-25 DIAGNOSIS — R748 Abnormal levels of other serum enzymes: Secondary | ICD-10-CM

## 2015-02-25 DIAGNOSIS — S30861D Insect bite (nonvenomous) of abdominal wall, subsequent encounter: Secondary | ICD-10-CM | POA: Diagnosis not present

## 2015-02-25 DIAGNOSIS — R7989 Other specified abnormal findings of blood chemistry: Secondary | ICD-10-CM

## 2015-02-25 DIAGNOSIS — D6489 Other specified anemias: Secondary | ICD-10-CM | POA: Diagnosis not present

## 2015-02-25 LAB — COMPREHENSIVE METABOLIC PANEL
ALBUMIN: 3.9 g/dL (ref 3.5–5.2)
ALT: 28 U/L (ref 0–35)
AST: 34 U/L (ref 0–37)
Alkaline Phosphatase: 81 U/L (ref 39–117)
BILIRUBIN TOTAL: 0.4 mg/dL (ref 0.2–1.2)
BUN: 26 mg/dL — ABNORMAL HIGH (ref 6–23)
CO2: 24 meq/L (ref 19–32)
Calcium: 8.9 mg/dL (ref 8.4–10.5)
Chloride: 110 mEq/L (ref 96–112)
Creatinine, Ser: 1.39 mg/dL — ABNORMAL HIGH (ref 0.40–1.20)
GFR: 38.58 mL/min — AB (ref >60.00)
GLUCOSE: 92 mg/dL (ref 70–99)
POTASSIUM: 4 meq/L (ref 3.5–5.1)
Sodium: 139 mEq/L (ref 135–145)
TOTAL PROTEIN: 7.1 g/dL (ref 6.0–8.3)

## 2015-02-25 LAB — CBC WITH DIFFERENTIAL/PLATELET
Basophils Absolute: 0 10*3/uL (ref 0.0–0.1)
Basophils Relative: 0.4 % (ref 0.0–3.0)
Eosinophils Absolute: 0.4 10*3/uL (ref 0.0–0.7)
Eosinophils Relative: 5.4 % — ABNORMAL HIGH (ref 0.0–5.0)
HEMATOCRIT: 28.6 % — AB (ref 36.0–46.0)
Hemoglobin: 9.6 g/dL — ABNORMAL LOW (ref 12.0–15.0)
LYMPHS ABS: 1.1 10*3/uL (ref 0.7–4.0)
Lymphocytes Relative: 16.6 % (ref 12.0–46.0)
MCHC: 33.5 g/dL (ref 30.0–36.0)
MCV: 94.4 fl (ref 78.0–100.0)
MONO ABS: 0.7 10*3/uL (ref 0.1–1.0)
Monocytes Relative: 10.2 % (ref 3.0–12.0)
Neutro Abs: 4.6 10*3/uL (ref 1.4–7.7)
Neutrophils Relative %: 67.4 % (ref 43.0–77.0)
PLATELETS: 245 10*3/uL (ref 150.0–400.0)
RBC: 3.03 Mil/uL — AB (ref 3.87–5.11)
RDW: 14.1 % (ref 11.5–15.5)
WBC: 6.8 10*3/uL (ref 4.0–10.5)

## 2015-02-25 LAB — VITAMIN B12: Vitamin B-12: 249 pg/mL (ref 211–911)

## 2015-02-25 NOTE — Telephone Encounter (Signed)
Per chart review. Pt never got labs ordered on last visit. Does not look like they were drawn. Ask lpn  to call pt and remind her labs were placed.

## 2015-02-27 LAB — LYME ABY, WSTRN BLT IGG & IGM W/BANDS
B BURGDORFERI IGG ABS (IB): NEGATIVE
B BURGDORFERI IGM ABS (IB): NEGATIVE
LYME DISEASE 18 KD IGG: NONREACTIVE
LYME DISEASE 39 KD IGM: NONREACTIVE
LYME DISEASE 41 KD IGM: NONREACTIVE
LYME DISEASE 66 KD IGG: NONREACTIVE
Lyme Disease 23 kD IgG: NONREACTIVE
Lyme Disease 23 kD IgM: NONREACTIVE
Lyme Disease 28 kD IgG: NONREACTIVE
Lyme Disease 30 kD IgG: NONREACTIVE
Lyme Disease 39 kD IgG: NONREACTIVE
Lyme Disease 41 kD IgG: NONREACTIVE
Lyme Disease 45 kD IgG: NONREACTIVE
Lyme Disease 58 kD IgG: NONREACTIVE
Lyme Disease 93 kD IgG: NONREACTIVE

## 2015-02-27 LAB — INTRINSIC FACTOR ANTIBODIES: Intrinsic Factor: NEGATIVE

## 2015-03-03 ENCOUNTER — Telehealth: Payer: Self-pay | Admitting: Medical

## 2015-03-03 DIAGNOSIS — N289 Disorder of kidney and ureter, unspecified: Secondary | ICD-10-CM

## 2015-03-03 DIAGNOSIS — D649 Anemia, unspecified: Secondary | ICD-10-CM

## 2015-03-04 LAB — ROCKY MTN SPOTTED FVR ABS PNL(IGG+IGM)
RMSF IGG: 0.09 IV
RMSF IgM: 0.95 IV — ABNORMAL HIGH

## 2015-03-06 ENCOUNTER — Other Ambulatory Visit: Payer: PPO

## 2015-03-06 NOTE — Telephone Encounter (Signed)
Note to review and refer to nephrologist.

## 2015-03-09 ENCOUNTER — Encounter: Payer: Self-pay | Admitting: Medical

## 2015-03-11 ENCOUNTER — Other Ambulatory Visit: Payer: PPO

## 2015-03-19 ENCOUNTER — Telehealth: Payer: Self-pay | Admitting: Medical

## 2015-03-19 DIAGNOSIS — D649 Anemia, unspecified: Secondary | ICD-10-CM

## 2015-03-19 MED ORDER — PREDNISONE 20 MG PO TABS: ORAL_TABLET | ORAL | Status: DC

## 2015-03-19 NOTE — Telephone Encounter (Signed)
Refer to hematologist °

## 2015-03-20 ENCOUNTER — Telehealth: Payer: Self-pay | Admitting: Medical

## 2015-03-20 NOTE — Telephone Encounter (Signed)
Prednisone Rx was faxed accidentally yesterday. I called the pharmacy and left a Vm to cancel per [employer]. The pharmacy was closed so I asked for them to return my call once the message was received.      KP

## 2015-03-20 NOTE — Telephone Encounter (Signed)
Prednisone rx cancelled.

## 2015-03-21 ENCOUNTER — Telehealth: Payer: Self-pay | Admitting: Hematology & Oncology

## 2015-03-21 NOTE — Telephone Encounter (Signed)
PT WENT WITH ANOTHER DR.

## 2015-03-29 ENCOUNTER — Ambulatory Visit (INDEPENDENT_AMBULATORY_CARE_PROVIDER_SITE_OTHER): Payer: PPO | Admitting: Family Medicine

## 2015-03-29 ENCOUNTER — Encounter: Payer: Self-pay | Admitting: Family Medicine

## 2015-03-29 VITALS — BP 128/62 | HR 70 | Temp 98.6°F | Ht 62.5 in | Wt 114.1 lb

## 2015-03-29 DIAGNOSIS — K219 Gastro-esophageal reflux disease without esophagitis: Secondary | ICD-10-CM

## 2015-03-29 DIAGNOSIS — N289 Disorder of kidney and ureter, unspecified: Secondary | ICD-10-CM | POA: Diagnosis not present

## 2015-03-29 DIAGNOSIS — T148 Other injury of unspecified body region: Secondary | ICD-10-CM | POA: Diagnosis not present

## 2015-03-29 DIAGNOSIS — K625 Hemorrhage of anus and rectum: Secondary | ICD-10-CM

## 2015-03-29 DIAGNOSIS — D649 Anemia, unspecified: Secondary | ICD-10-CM

## 2015-03-29 DIAGNOSIS — D508 Other iron deficiency anemias: Secondary | ICD-10-CM

## 2015-03-29 DIAGNOSIS — W57XXXA Bitten or stung by nonvenomous insect and other nonvenomous arthropods, initial encounter: Secondary | ICD-10-CM | POA: Diagnosis not present

## 2015-03-29 HISTORY — DX: Disorder of kidney and ureter, unspecified: N28.9

## 2015-03-29 HISTORY — DX: Anemia, unspecified: D64.9

## 2015-03-29 LAB — COMPREHENSIVE METABOLIC PANEL
ALK PHOS: 66 U/L (ref 39–117)
ALT: 11 U/L (ref 0–35)
AST: 19 U/L (ref 0–37)
Albumin: 4.2 g/dL (ref 3.5–5.2)
BUN: 21 mg/dL (ref 6–23)
CALCIUM: 9.4 mg/dL (ref 8.4–10.5)
CO2: 26 mEq/L (ref 19–32)
Chloride: 107 mEq/L (ref 96–112)
Creatinine, Ser: 1.28 mg/dL — ABNORMAL HIGH (ref 0.40–1.20)
GFR: 42.43 mL/min — AB (ref >60.00)
GLUCOSE: 87 mg/dL (ref 70–99)
Potassium: 4.1 mEq/L (ref 3.5–5.1)
Sodium: 139 mEq/L (ref 135–145)
TOTAL PROTEIN: 7.7 g/dL (ref 6.0–8.3)
Total Bilirubin: 0.4 mg/dL (ref 0.2–1.2)

## 2015-03-29 LAB — CBC
HCT: 30.6 % — ABNORMAL LOW (ref 36.0–46.0)
HEMOGLOBIN: 10.2 g/dL — AB (ref 12.0–15.0)
MCHC: 33.3 g/dL (ref 30.0–36.0)
MCV: 96.3 fl (ref 78.0–100.0)
Platelets: 258 10*3/uL (ref 150.0–400.0)
RBC: 3.18 Mil/uL — AB (ref 3.87–5.11)
RDW: 14.9 % (ref 11.5–15.5)
WBC: 7.8 10*3/uL (ref 4.0–10.5)

## 2015-03-29 LAB — IRON: IRON: 65 ug/dL (ref 42–145)

## 2015-03-29 LAB — FERRITIN: Ferritin: 155.4 ng/mL (ref 10.0–291.0)

## 2015-03-29 LAB — SEDIMENTATION RATE: Sed Rate: 43 mm/hr — ABNORMAL HIGH (ref 0–22)

## 2015-03-29 NOTE — Progress Notes (Signed)
Pre visit review using our clinic review tool, if applicable. No additional management support is needed unless otherwise documented below in the visit note. 

## 2015-03-29 NOTE — Patient Instructions (Signed)
Start a Probiotic daily such as Digestive Advantage or [employer] Colon Health Increase Metamucil to twice daily  Encouraged increased hydration and fiber in diet. Daily probiotics. If bowels not moving can use MOM 2 tbls po in 4 oz of warm prune juice by mouth every 2-3 days. If no results then repeat in 4 hours with  Dulcolax suppository pr, may repeat again in 4 more hours as needed. Seek care if symptoms worsen. Consider daily Miralax and/or Dulcolax if symptoms persist.      Constipation Constipation is when a person has fewer than three bowel movements a week, has difficulty having a bowel movement, or has stools that are dry, hard, or larger than normal. As people grow older, constipation is more common. If you try to fix constipation with medicines that make you have a bowel movement (laxatives), the problem may get worse. Long-term laxative use may cause the muscles of the colon to become weak. A low-fiber diet, not taking in enough fluids, and taking certain medicines may make constipation worse.  CAUSES   Certain medicines, such as antidepressants, pain medicine, iron supplements, antacids, and water pills.   Certain diseases, such as diabetes, irritable bowel syndrome (IBS), thyroid disease, or depression.   Not drinking enough water.   Not eating enough fiber-rich foods.   Stress or travel.   Lack of physical activity or exercise.   Ignoring the urge to have a bowel movement.   Using laxatives too much.  SIGNS AND SYMPTOMS   Having fewer than three bowel movements a week.   Straining to have a bowel movement.   Having stools that are hard, dry, or larger than normal.   Feeling full or bloated.   Pain in the lower abdomen.   Not feeling relief after having a bowel movement.  DIAGNOSIS  Your health care provider will take a medical history and perform a physical exam. Further testing may be done for severe constipation. Some tests may include:  A  barium enema X-ray to examine your rectum, colon, and, sometimes, your small intestine.   A sigmoidoscopy to examine your lower colon.   A colonoscopy to examine your entire colon. TREATMENT  Treatment will depend on the severity of your constipation and what is causing it. Some dietary treatments include drinking more fluids and eating more fiber-rich foods. Lifestyle treatments may include regular exercise. If these diet and lifestyle recommendations do not help, your health care provider may recommend taking over-the-counter laxative medicines to help you have bowel movements. Prescription medicines may be prescribed if over-the-counter medicines do not work.  HOME CARE INSTRUCTIONS   Eat foods that have a lot of fiber, such as fruits, vegetables, whole grains, and beans.  Limit foods high in fat and processed sugars, such as french fries, hamburgers, cookies, candies, and soda.   A fiber supplement may be added to your diet if you cannot get enough fiber from foods.   Drink enough fluids to keep your urine clear or pale yellow.   Exercise regularly or as directed by your health care provider.   Go to the restroom when you have the urge to go. Do not hold it.   Only take over-the-counter or prescription medicines as directed by your health care provider. Do not take other medicines for constipation without talking to your health care provider first.  [place] IF:   You have bright red blood in your stool.   Your constipation lasts for more than 4 days or  gets worse.   You have abdominal or rectal pain.   You have thin, pencil-like stools.   You have unexplained weight loss. MAKE SURE YOU:   Understand these instructions.  Will watch your condition.  Will get help right away if you are not doing well or get worse. Document Released: 05/29/2004 Document Revised: 09/05/2013 Document Reviewed: 06/12/2013 [facility] Patient Information 2015  [place], [place]. This information is not intended to replace advice given to you by your health care provider. Make sure you discuss any questions you have with your health care provider.

## 2015-03-29 NOTE — Assessment & Plan Note (Addendum)
Recheck IFOB patient reports she had some hemorrhoidal bleeding the day of the the last test, will repeat and continue Flintstone's chewables.

## 2015-03-29 NOTE — Assessment & Plan Note (Signed)
Left shoulder bite was 11/26/2014, was only able to remove 1/2 the tick, the rest was removed 3 days later, was prescribed Doxycyline for 10 days for a possible infection.  RMSF and Lyme testing negative. In first few days after tick bite did have flu like symptoms of fatigue and myalgias. Those symptoms are better but rash is still irritating

## 2015-04-02 LAB — EHRLICHIA ANTIBODY PANEL
E chaffeensis (HGE) Ab, IgG: <1:64 {titer}
E chaffeensis (HGE) Ab, IgM: <1:20 {titer}

## 2015-04-12 ENCOUNTER — Other Ambulatory Visit: Payer: PPO

## 2015-04-15 NOTE — Progress Notes (Signed)
Molly Williams  [id number] 1930/11/30 04/15/2015      Progress Note-Follow Up  Subjective  Chief Complaint  Chief Complaint  Patient presents with  . Follow-up    tick bite    HPI  Patient is a 81 y.o. female in today for routine medical care. Patient is in for an ur She reports a rash where she had a tick bite 5  Weeks ago.No fevers or headaches Rash is mildly pruritic No other recent illness. Has a past medical hist heartburn, hyperglycemia, osteoporosis, hyperlipidemia and allergies. Denies CP/palp/SOB/HA/congestion/fevers/GI or GU c/o. Taking meds as prescribed  Past Medical History  Diagnosis Date  . Migraine aura without headache     h/o ocular migraines  . Osteopenia   . Spinal stenosis   . Retinal detachment with break     right/laser  . GERD (gastroesophageal reflux disease)   . Hearing loss sensory, bilateral     bil/hearing aids  . Arthritis   . Urinary tract bacterial infections     has had urethral dilations in past  . Atrophic vaginitis   . Lupus (systemic lupus erythematosus)     Dr. Charlestine Night  . Hematuria     chronic Dr. Burnell Blanks  . Hearing loss   . Fibrocystic breast disease     bx 2008 neg per pt report  . Thyroid disease     hypothyroidism  . Cholelithiasis 2008  . Renal insufficiency 03/29/2015  . Anemia 03/29/2015    Past Surgical History  Procedure Laterality Date  . Cataract extraction w/ intraocular lens  implant, bilateral    . Vaginal hysterectomy      partial  . Cholecystectomy    . Tonsillectomy  1940  . Appendectomy  2006    Family History  Problem Relation Age of Onset  . Stroke Paternal Grandmother   . Stroke Father   . Coronary artery disease Father   . Coronary artery disease Mother     History   Social History  . Marital Status: Widowed    Spouse Name: N/A  . Number of Children: N/A  . Years of Education: N/A   Occupational History  . Not on file.   Social History Main Topics  . Smoking status: Former  Research scientist (life sciences)  . Smokeless tobacco: Never Used  . Alcohol Use: 6.0 oz/week    10 Glasses of wine per week  . Drug Use: No  . Sexual Activity: No   Other Topics Concern  . Not on file   Social History Narrative    Current Outpatient Prescriptions on File Prior to Visit  Medication Sig Dispense Refill  . acetaminophen (TYLENOL) 500 MG tablet Take 1,000 mg by mouth every 6 (six) hours as needed. pain     . conjugated estrogens (PREMARIN) vaginal cream Use one gram vaginally two times a week 42.5 g 0  . diphenhydrAMINE (BENADRYL) 25 mg capsule Take 25 mg by mouth every 6 (six) hours as needed.    Marland Kitchen levothyroxine (SYNTHROID, LEVOTHROID) 25 MCG tablet Take 1 tablet (25 mcg total) by mouth daily. 90 tablet 2  . loratadine (CLARITIN) 10 MG tablet Take 10 mg by mouth daily.    . Naproxen Sodium 220 MG CAPS Take by mouth.    Marland Kitchen omeprazole (PRILOSEC) 20 MG capsule Take 1 capsule (20 mg total) by mouth 2 (two) times daily. 60 capsule 3  . Probiotic Product (PROBIOTIC DAILY PO) Take by mouth.    . Triamcinolone Acetonide (NASACORT AQ NA) Place into  the nose.     No current facility-administered medications on file prior to visit.    Allergies  Allergen Reactions  . Bacitracin Swelling    Ophthalmic ointment  . Codeine Nausea And Vomiting  . Iodine Hives    Contrast dye    Review of Systems  Review of Systems  Constitutional: Negative for fever and malaise/fatigue.  HENT: Negative for congestion.   Eyes: Negative for discharge.  Respiratory: Negative for shortness of breath.   Cardiovascular: Negative for chest pain, palpitations and leg swelling.  Gastrointestinal: Negative for nausea, abdominal pain and diarrhea.  Genitourinary: Negative for dysuria.  Musculoskeletal: Negative for falls.  Skin: Positive for rash.  Neurological: Negative for loss of consciousness and headaches.  Endo/Heme/Allergies: Negative for polydipsia.  Psychiatric/Behavioral: Negative for depression and suicidal  ideas. The patient is not nervous/anxious and does not have insomnia.     Objective  BP 128/62 mmHg  Pulse 70  Temp(Src) 98.6 F (37 C) (Oral)  Ht 5' 2.5" (1.588 m)  Wt 114 lb 2 oz (51.767 kg)  BMI 20.53 kg/m2  SpO2 97%  Physical Exam  Physical Exam  Constitutional: She is oriented to person, place, and time and well-developed, well-nourished, and in no distress. No distress.  HENT:  Head: Normocephalic and atraumatic.  Eyes: Conjunctivae are normal.  Neck: Neck supple. No thyromegaly present.  Cardiovascular: Normal rate, regular rhythm and normal heart sounds.   No murmur heard. Pulmonary/Chest: Effort normal and breath sounds normal. She has no wheezes.  Abdominal: She exhibits no distension and no mass.  Musculoskeletal: She exhibits no edema.  Lymphadenopathy:    She has no cervical adenopathy.  Neurological: She is alert and oriented to person, place, and time.  Skin: Skin is warm and dry. No rash noted. She is not diaphoretic.  Psychiatric: Memory, affect and judgment normal.    Lab Results  Component Value Date   TSH 3.975 10/23/2014   Lab Results  Component Value Date   WBC 7.8 03/29/2015   HGB 10.2* 03/29/2015   HCT 30.6* 03/29/2015   MCV 96.3 03/29/2015   PLT 258.0 03/29/2015   Lab Results  Component Value Date   CREATININE 1.28* 03/29/2015   BUN 21 03/29/2015   NA 139 03/29/2015   K 4.1 03/29/2015   CL 107 03/29/2015   CO2 26 03/29/2015   Lab Results  Component Value Date   ALT 11 03/29/2015   AST 19 03/29/2015   ALKPHOS 66 03/29/2015   BILITOT 0.4 03/29/2015   Lab Results  Component Value Date   CHOL 235* 10/23/2014   Lab Results  Component Value Date   HDL 89 10/23/2014   Lab Results  Component Value Date   LDLCALC 130* 10/23/2014   Lab Results  Component Value Date   TRIG 80 10/23/2014   Lab Results  Component Value Date   CHOLHDL 2.6 10/23/2014     Assessment & Plan  Tick bite Left shoulder bite was 11/26/2014, was  only able to remove 1/2 the tick, the rest was removed 3 days later, was prescribed Doxycyline for 10 days for a possible infection.  RMSF and Lyme testing negative. In first few days after tick bite did have flu like symptoms of fatigue and myalgias. Those symptoms are better but rash is still irritating  Anemia Recheck IFOB patient reports she had some hemorrhoidal bleeding the day of the the last test, will repeat and continue Flintstone's chewables.  GERD (gastroesophageal reflux disease) Avoid offending foods, start  probiotics. Do not eat large meals in late evening and consider raising head of bed.   Renal insufficiency Mild, stable, maintain adequate hydration and minimize OTC meds

## 2015-04-15 NOTE — Assessment & Plan Note (Signed)
Avoid offending foods, start probiotics. Do not eat large meals in late evening and consider raising head of bed.  

## 2015-04-15 NOTE — Assessment & Plan Note (Signed)
Mild, stable, maintain adequate hydration and minimize OTC meds

## 2015-04-16 ENCOUNTER — Other Ambulatory Visit: Payer: PPO

## 2015-04-16 LAB — FECAL OCCULT BLOOD, IMMUNOCHEMICAL: Fecal Occult Bld: NEGATIVE

## 2015-04-16 NOTE — Addendum Note (Signed)
Addended by: Modena Morrow D on: 04/16/2015 11:37 AM   Modules accepted: Orders

## 2015-04-17 ENCOUNTER — Telehealth: Payer: Self-pay

## 2015-04-17 NOTE — Telephone Encounter (Signed)
Pt notified very excited about the negative stool. No question or concerns at this time.

## 2015-04-17 NOTE — Telephone Encounter (Signed)
-----   Message from Mosie Lukes, MD sent at 04/16/2015 11:31 PM EDT ----- Notify negative stool for blood, no changes

## 2015-07-10 ENCOUNTER — Telehealth: Payer: Self-pay

## 2015-07-10 NOTE — Telephone Encounter (Signed)
Pre-Visit Call completed. 

## 2015-07-11 ENCOUNTER — Encounter: Payer: Self-pay | Admitting: Family Medicine

## 2015-07-11 ENCOUNTER — Ambulatory Visit (INDEPENDENT_AMBULATORY_CARE_PROVIDER_SITE_OTHER): Payer: PPO | Admitting: Family Medicine

## 2015-07-11 VITALS — BP 122/62 | HR 68 | Temp 98.3°F | Ht 63.0 in | Wt 113.1 lb

## 2015-07-11 DIAGNOSIS — M810 Age-related osteoporosis without current pathological fracture: Secondary | ICD-10-CM

## 2015-07-11 DIAGNOSIS — L989 Disorder of the skin and subcutaneous tissue, unspecified: Secondary | ICD-10-CM

## 2015-07-11 DIAGNOSIS — M4806 Spinal stenosis, lumbar region: Secondary | ICD-10-CM

## 2015-07-11 DIAGNOSIS — D649 Anemia, unspecified: Secondary | ICD-10-CM

## 2015-07-11 DIAGNOSIS — N390 Urinary tract infection, site not specified: Secondary | ICD-10-CM | POA: Diagnosis not present

## 2015-07-11 DIAGNOSIS — Z23 Encounter for immunization: Secondary | ICD-10-CM | POA: Diagnosis not present

## 2015-07-11 DIAGNOSIS — T148 Other injury of unspecified body region: Secondary | ICD-10-CM | POA: Diagnosis not present

## 2015-07-11 DIAGNOSIS — E079 Disorder of thyroid, unspecified: Secondary | ICD-10-CM

## 2015-07-11 DIAGNOSIS — R739 Hyperglycemia, unspecified: Secondary | ICD-10-CM | POA: Diagnosis not present

## 2015-07-11 DIAGNOSIS — A499 Bacterial infection, unspecified: Secondary | ICD-10-CM

## 2015-07-11 DIAGNOSIS — E559 Vitamin D deficiency, unspecified: Secondary | ICD-10-CM

## 2015-07-11 DIAGNOSIS — M48061 Spinal stenosis, lumbar region without neurogenic claudication: Secondary | ICD-10-CM

## 2015-07-11 DIAGNOSIS — N289 Disorder of kidney and ureter, unspecified: Secondary | ICD-10-CM | POA: Diagnosis not present

## 2015-07-11 DIAGNOSIS — M329 Systemic lupus erythematosus, unspecified: Secondary | ICD-10-CM

## 2015-07-11 DIAGNOSIS — K219 Gastro-esophageal reflux disease without esophagitis: Secondary | ICD-10-CM

## 2015-07-11 DIAGNOSIS — W57XXXA Bitten or stung by nonvenomous insect and other nonvenomous arthropods, initial encounter: Secondary | ICD-10-CM

## 2015-07-11 DIAGNOSIS — G4700 Insomnia, unspecified: Secondary | ICD-10-CM

## 2015-07-11 HISTORY — DX: Vitamin D deficiency, unspecified: E55.9

## 2015-07-11 NOTE — Assessment & Plan Note (Signed)
Negative Lyme and Ehrlichia, RMSF was equivocal. Tolerated Doxycycline due to rash and flu like symptoms and did well.

## 2015-07-11 NOTE — Assessment & Plan Note (Signed)
Has been worked up by [employer]

## 2015-07-11 NOTE — Patient Instructions (Addendum)
Change back to a plain fiber supplement such as Metamucil or Benefiber  BRAT diet (bananas, rice, applesauce, toast) for 24 to 48 hours.  Restart probiotics  [facility] and [employer]ive Advantage probiotics  Consider a calcium Citrate product such as Citracal (avoid Carbonate)  Diarrhea Diarrhea is frequent loose and watery bowel movements. It can cause you to feel weak and dehydrated. Dehydration can cause you to become tired and thirsty, have a dry mouth, and have decreased urination that often is dark yellow. Diarrhea is a sign of another problem, most often an infection that will not last long. In most cases, diarrhea typically lasts 2-3 days. However, it can last longer if it is a sign of something more serious. It is important to treat your diarrhea as directed by your caregiver to lessen or prevent future episodes of diarrhea. CAUSES  Some common causes include:  Gastrointestinal infections caused by viruses, bacteria, or parasites.  Food poisoning or food allergies.  Certain medicines, such as antibiotics, chemotherapy, and laxatives.  Artificial sweeteners and fructose.  Digestive disorders. HOME CARE INSTRUCTIONS  Ensure adequate fluid intake (hydration): Have 1 cup (8 oz) of fluid for each diarrhea episode. Avoid fluids that contain simple sugars or sports drinks, fruit juices, whole milk products, and sodas. Your urine should be clear or pale yellow if you are drinking enough fluids. Hydrate with an oral rehydration solution that you can purchase at pharmacies, retail stores, and online. You can prepare an oral rehydration solution at home by mixing the following ingredients together:   - tsp table salt.   tsp baking soda.   tsp salt substitute containing potassium chloride.  1  tablespoons sugar.  1 L (34 oz) of water.  Certain foods and beverages may increase the speed at which food moves through the gastrointestinal (GI) tract. These foods and beverages  should be avoided and include:  Caffeinated and alcoholic beverages.  High-fiber foods, such as raw fruits and vegetables, nuts, seeds, and whole grain breads and cereals.  Foods and beverages sweetened with sugar alcohols, such as xylitol, sorbitol, and mannitol.  Some foods may be well tolerated and may help thicken stool including:  Starchy foods, such as rice, toast, pasta, low-sugar cereal, oatmeal, grits, baked potatoes, crackers, and bagels.  Bananas.  Applesauce.  Add probiotic-rich foods to help increase healthy bacteria in the GI tract, such as yogurt and fermented milk products.  Wash your hands well after each diarrhea episode.  Only take over-the-counter or prescription medicines as directed by your caregiver.  Take a warm bath to relieve any burning or pain from frequent diarrhea episodes. SEEK IMMEDIATE MEDICAL CARE IF:   You are unable to keep fluids down.  You have persistent vomiting.  You have blood in your stool, or your stools are black and tarry.  You do not urinate in 6-8 hours, or there is only a small amount of very dark urine.  You have abdominal pain that increases or localizes.  You have weakness, dizziness, confusion, or light-headedness.  You have a severe headache.  Your diarrhea gets worse or does not get better.  You have a fever or persistent symptoms for more than 2-3 days.  You have a fever and your symptoms suddenly get worse. MAKE SURE YOU:   Understand these instructions.  Will watch your condition.  Will get help right away if you are not doing well or get worse.   This information is not intended to replace advice given to you by  your health care provider. Make sure you discuss any questions you have with your health care provider.   Document Released: 08/21/2002 Document Revised: 09/21/2014 Document Reviewed: 05/08/2012 Elsevier Interactive Patient Education [employer].

## 2015-07-11 NOTE — Assessment & Plan Note (Signed)
Took fosamax previously and tolerated liquid but did not tolerate the tab in 2008. Had severe myalgias. Awaiting Dexa scan.

## 2015-07-11 NOTE — Assessment & Plan Note (Signed)
Mild, repeat CBC

## 2015-07-11 NOTE — Progress Notes (Signed)
Pre visit review using our clinic review tool, if applicable. No additional management support is needed unless otherwise documented below in the visit note. 

## 2015-07-11 NOTE — Assessment & Plan Note (Signed)
Physical therapy has helped her pain and she is feeling well

## 2015-07-12 ENCOUNTER — Other Ambulatory Visit: Payer: Self-pay | Admitting: Family Medicine

## 2015-07-12 ENCOUNTER — Encounter: Payer: Self-pay | Admitting: Family Medicine

## 2015-07-12 DIAGNOSIS — E538 Deficiency of other specified B group vitamins: Secondary | ICD-10-CM

## 2015-07-12 LAB — CBC
HEMATOCRIT: 35.8 % — AB (ref 36.0–46.0)
HEMOGLOBIN: 12 g/dL (ref 12.0–15.0)
MCHC: 33.6 g/dL (ref 30.0–36.0)
MCV: 96.3 fl (ref 78.0–100.0)
Platelets: 321 10*3/uL (ref 150.0–400.0)
RBC: 3.71 Mil/uL — ABNORMAL LOW (ref 3.87–5.11)
RDW: 14.4 % (ref 11.5–15.5)
WBC: 6.6 10*3/uL (ref 4.0–10.5)

## 2015-07-12 LAB — COMPREHENSIVE METABOLIC PANEL
ALBUMIN: 4.3 g/dL (ref 3.5–5.2)
ALK PHOS: 63 U/L (ref 39–117)
ALT: 19 U/L (ref 0–35)
AST: 23 U/L (ref 0–37)
BILIRUBIN TOTAL: 0.3 mg/dL (ref 0.2–1.2)
BUN: 18 mg/dL (ref 6–23)
CO2: 27 mEq/L (ref 19–32)
CREATININE: 1.03 mg/dL (ref 0.40–1.20)
Calcium: 9.8 mg/dL (ref 8.4–10.5)
Chloride: 105 mEq/L (ref 96–112)
GFR: 54.48 mL/min — ABNORMAL LOW (ref >60.00)
Glucose, Bld: 95 mg/dL (ref 70–99)
POTASSIUM: 4.8 meq/L (ref 3.5–5.1)
SODIUM: 139 meq/L (ref 135–145)
TOTAL PROTEIN: 7.8 g/dL (ref 6.0–8.3)

## 2015-07-12 LAB — ANTI-NUCLEAR AB-TITER (ANA TITER)

## 2015-07-12 LAB — HEMOGLOBIN A1C: Hgb A1c MFr Bld: 5.6 % (ref 4.6–6.5)

## 2015-07-12 LAB — VITAMIN B12: VITAMIN B 12: 200 pg/mL — AB (ref 211–911)

## 2015-07-12 LAB — ANA: Anti Nuclear Antibody(ANA): POSITIVE — AB

## 2015-07-12 LAB — TSH: TSH: 3.08 u[IU]/mL (ref 0.35–4.50)

## 2015-07-12 LAB — SEDIMENTATION RATE: Sed Rate: 39 mm/hr — ABNORMAL HIGH (ref 0–22)

## 2015-07-12 LAB — VITAMIN D 25 HYDROXY (VIT D DEFICIENCY, FRACTURES): VITD: 28.42 ng/mL — ABNORMAL LOW (ref 30.00–100.00)

## 2015-07-17 NOTE — Telephone Encounter (Signed)
Pre Visit call completed. 

## 2015-07-21 NOTE — Assessment & Plan Note (Signed)
Encouraged good sleep hygiene such as dark, quiet room. No blue/green glowing lights such as computer screens in bedroom. No alcohol or stimulants in evening. Cut down on caffeine as able. Regular exercise is helpful but not just prior to bed time.  

## 2015-07-21 NOTE — Progress Notes (Signed)
Subjective:    Patient ID: Molly Williams, female    DOB: 1931-06-12, 81 y.o.   MRN: [id number]  Chief Complaint  Patient presents with  . Establish Care    HPI Patient is in today for new patient appointment. She feels well today but does have a significant past medical history. She has chronic renal insufficiency and follows at [facility]. She has lupus with ANA positivity and has followed with Dr. Charlestine Night of rheumatology. She struggles with constipation and diarrhea intermittently. Also is noted to have hyperglycemia, anemia and reflux. Denies CP/palp/SOB/HA/congestion/fevers/GI or GU c/o. Taking meds as prescribed Past Medical History  Diagnosis Date  . Migraine aura without headache     h/o ocular migraines  . Osteopenia   . Spinal stenosis   . Retinal detachment with break     right/laser  . GERD (gastroesophageal reflux disease)   . Hearing loss sensory, bilateral     bil/hearing aids  . Arthritis   . Urinary tract bacterial infections     has had urethral dilations in past  . Atrophic vaginitis   . Lupus (systemic lupus erythematosus) (HCC)     Dr. Charlestine Night  . Hematuria     chronic Dr. Burnell Blanks  . Hearing loss   . Fibrocystic breast disease     bx 2008 neg per pt report  . Thyroid disease     hypothyroidism  . Cholelithiasis 2008  . Renal insufficiency 03/29/2015  . Anemia 03/29/2015  . Vitamin D deficiency 07/11/2015    Past Surgical History  Procedure Laterality Date  . Cataract extraction w/ intraocular lens  implant, bilateral    . Vaginal hysterectomy      partial  . Cholecystectomy    . Tonsillectomy  1940  . Appendectomy  2006    Family History  Problem Relation Age of Onset  . Stroke Paternal Grandmother   . Stroke Father   . Coronary artery disease Father   . Coronary artery disease Mother   . Stroke Mother   . Cancer Paternal Aunt     GYN cancer  . Cancer Paternal Uncle     lung, smoker  . Stroke Maternal Grandmother   . Stroke  Maternal Grandfather   . Heart disease Paternal Grandfather   . Hydrocephalus Brother     normopressure hydrocephalus, dementia    Social History   Social History  . Marital Status: Widowed    Spouse Name: N/A  . Number of Children: N/A  . Years of Education: N/A   Occupational History  . Not on file.   Social History Main Topics  . Smoking status: Former Research scientist (life sciences)  . Smokeless tobacco: Never Used  . Alcohol Use: 6.0 oz/week    10 Glasses of wine per week  . Drug Use: No  . Sexual Activity: No     Comment: lives alone here because her daughter lives hier, no dietary restrictions, avoids dairy   Other Topics Concern  . Not on file   Social History Narrative    Outpatient Prescriptions Prior to Visit  Medication Sig Dispense Refill  . acetaminophen (TYLENOL) 500 MG tablet Take 1,000 mg by mouth every 6 (six) hours as needed. pain     . diphenhydrAMINE (BENADRYL) 25 mg capsule Take 25 mg by mouth every 6 (six) hours as needed.    Marland Kitchen levothyroxine (SYNTHROID, LEVOTHROID) 25 MCG tablet Take 1 tablet (25 mcg total) by mouth daily. 90 tablet 2  . loratadine (CLARITIN) 10 MG tablet  Take 10 mg by mouth daily.    . Naproxen Sodium 220 MG CAPS Take by mouth.    Marland Kitchen omeprazole (PRILOSEC) 20 MG capsule Take 1 capsule (20 mg total) by mouth 2 (two) times daily. 60 capsule 3  . Probiotic Product (PROBIOTIC DAILY PO) Take by mouth.    . Triamcinolone Acetonide (NASACORT AQ NA) Place into the nose.     No facility-administered medications prior to visit.    Allergies  Allergen Reactions  . Alendronate   . Bacitracin Swelling    Ophthalmic ointment  . Codeine Nausea And Vomiting  . Iodine Hives    Contrast dye    Review of Systems  Constitutional: Negative for fever and malaise/fatigue.  HENT: Negative for congestion.   Eyes: Negative for discharge.  Respiratory: Negative for shortness of breath.   Cardiovascular: Negative for chest pain, palpitations and leg swelling.    Gastrointestinal: Negative for nausea and abdominal pain.  Genitourinary: Negative for dysuria.  Musculoskeletal: Negative for falls.  Skin: Negative for rash.  Neurological: Negative for loss of consciousness and headaches.  Endo/Heme/Allergies: Negative for environmental allergies.  Psychiatric/Behavioral: Negative for depression. The patient is not nervous/anxious.        Objective:    Physical Exam  Constitutional: She is oriented to person, place, and time. She appears well-developed and well-nourished. No distress.  HENT:  Head: Normocephalic and atraumatic.  Eyes: Conjunctivae are normal.  Neck: Neck supple. No thyromegaly present.  Cardiovascular: Normal rate, regular rhythm and normal heart sounds.   No murmur heard. Pulmonary/Chest: Effort normal and breath sounds normal. No respiratory distress.  Abdominal: Soft. Bowel sounds are normal. She exhibits no distension and no mass. There is no tenderness.  Musculoskeletal: She exhibits no edema.  Lymphadenopathy:    She has no cervical adenopathy.  Neurological: She is alert and oriented to person, place, and time.  Skin: Skin is warm and dry.  Psychiatric: She has a normal mood and affect. Her behavior is normal.    BP 122/62 mmHg  Pulse 68  Temp(Src) 98.3 F (36.8 C) (Oral)  Ht '5\' 3"'  (1.6 m)  Wt 113 lb 2 oz (51.313 kg)  BMI 20.04 kg/m2  SpO2 97% Wt Readings from Last 3 Encounters:  07/11/15 113 lb 2 oz (51.313 kg)  03/29/15 114 lb 2 oz (51.767 kg)  02/12/15 116 lb (52.617 kg)     Lab Results  Component Value Date   WBC 6.6 07/11/2015   HGB 12.0 07/11/2015   HCT 35.8* 07/11/2015   PLT 321.0 07/11/2015   GLUCOSE 95 07/11/2015   CHOL 235* 10/23/2014   TRIG 80 10/23/2014   HDL 89 10/23/2014   LDLCALC 130* 10/23/2014   ALT 19 07/11/2015   AST 23 07/11/2015   NA 139 07/11/2015   K 4.8 07/11/2015   CL 105 07/11/2015   CREATININE 1.03 07/11/2015   BUN 18 07/11/2015   CO2 27 07/11/2015   TSH 3.08  07/11/2015   INR 0.94 09/22/2014   HGBA1C 5.6 07/11/2015    Lab Results  Component Value Date   TSH 3.08 07/11/2015   Lab Results  Component Value Date   WBC 6.6 07/11/2015   HGB 12.0 07/11/2015   HCT 35.8* 07/11/2015   MCV 96.3 07/11/2015   PLT 321.0 07/11/2015   Lab Results  Component Value Date   NA 139 07/11/2015   K 4.8 07/11/2015   CO2 27 07/11/2015   GLUCOSE 95 07/11/2015   BUN 18 07/11/2015  CREATININE 1.03 07/11/2015   BILITOT 0.3 07/11/2015   ALKPHOS 63 07/11/2015   AST 23 07/11/2015   ALT 19 07/11/2015   PROT 7.8 07/11/2015   ALBUMIN 4.3 07/11/2015   CALCIUM 9.8 07/11/2015   ANIONGAP 8 09/22/2014   GFR 54.48* 07/11/2015   Lab Results  Component Value Date   CHOL 235* 10/23/2014   Lab Results  Component Value Date   HDL 89 10/23/2014   Lab Results  Component Value Date   LDLCALC 130* 10/23/2014   Lab Results  Component Value Date   TRIG 80 10/23/2014   Lab Results  Component Value Date   CHOLHDL 2.6 10/23/2014   Lab Results  Component Value Date   HGBA1C 5.6 07/11/2015       Assessment & Plan:   Problem List Items Addressed This Visit    Vitamin D deficiency   Relevant Orders   TSH (Completed)   CBC (Completed)   Antinuclear Antib (ANA) (Completed)   Sed Rate (ESR) (Completed)   Vit D  25 hydroxy (rtn osteoporosis monitoring) (Completed)   Comprehensive metabolic panel (Completed)   Vitamin B12 (Completed)   Urinary tract bacterial infections   Relevant Orders   TSH (Completed)   CBC (Completed)   Antinuclear Antib (ANA) (Completed)   Sed Rate (ESR) (Completed)   Vit D  25 hydroxy (rtn osteoporosis monitoring) (Completed)   Comprehensive metabolic panel (Completed)   Vitamin B12 (Completed)   Tick bite    Negative Lyme and Ehrlichia, RMSF was equivocal. Tolerated Doxycycline due to rash and flu like symptoms and did well.      Relevant Orders   TSH (Completed)   CBC (Completed)   Antinuclear Antib (ANA) (Completed)    Sed Rate (ESR) (Completed)   Vit D  25 hydroxy (rtn osteoporosis monitoring) (Completed)   Comprehensive metabolic panel (Completed)   Vitamin B12 (Completed)   Thyroid disease    On Levothyroxine, continue to monitor      Relevant Orders   TSH (Completed)   CBC (Completed)   Antinuclear Antib (ANA) (Completed)   Sed Rate (ESR) (Completed)   Vit D  25 hydroxy (rtn osteoporosis monitoring) (Completed)   Comprehensive metabolic panel (Completed)   Vitamin B12 (Completed)   Spinal stenosis    Physical therapy has helped her pain and she is feeling well      Relevant Orders   TSH (Completed)   CBC (Completed)   Antinuclear Antib (ANA) (Completed)   Sed Rate (ESR) (Completed)   Vit D  25 hydroxy (rtn osteoporosis monitoring) (Completed)   Comprehensive metabolic panel (Completed)   Vitamin B12 (Completed)   Renal insufficiency    Has been worked up by [facility]      Relevant Orders   TSH (Completed)   CBC (Completed)   Antinuclear Antib (ANA) (Completed)   Sed Rate (ESR) (Completed)   Vit D  25 hydroxy (rtn osteoporosis monitoring) (Completed)   Comprehensive metabolic panel (Completed)   Vitamin B12 (Completed)   Osteoporosis    Took fosamax previously and tolerated liquid but did not tolerate the tab in 2008. Had severe myalgias. Awaiting Dexa scan.      Relevant Orders   DG Bone Density   TSH (Completed)   CBC (Completed)   Antinuclear Antib (ANA) (Completed)   Sed Rate (ESR) (Completed)   Vit D  25 hydroxy (rtn osteoporosis monitoring) (Completed)   Comprehensive metabolic panel (Completed)   Vitamin B12 (Completed)   Lupus (systemic lupus erythematosus) (  [place])    ANA positive, patient doing well      Insomnia    Encouraged good sleep hygiene such as dark, quiet room. No blue/green glowing lights such as computer screens in bedroom. No alcohol or stimulants in evening. Cut down on caffeine as able. Regular exercise is helpful but not just prior to bed  time.       Hyperglycemia    hgba1c acceptable, minimize simple carbs. Increase exercise as tolerated.       Relevant Orders   TSH (Completed)   CBC (Completed)   Antinuclear Antib (ANA) (Completed)   Sed Rate (ESR) (Completed)   Vit D  25 hydroxy (rtn osteoporosis monitoring) (Completed)   Comprehensive metabolic panel (Completed)   Vitamin B12 (Completed)   Hemoglobin A1c (Completed)   GERD (gastroesophageal reflux disease)    Avoid offending foods, start probiotics. Do not eat large meals in late evening and consider raising head of bed.       Anemia    Mild, repeat CBC      Relevant Orders   TSH (Completed)   CBC (Completed)   Antinuclear Antib (ANA) (Completed)   Sed Rate (ESR) (Completed)   Vit D  25 hydroxy (rtn osteoporosis monitoring) (Completed)   Comprehensive metabolic panel (Completed)   Vitamin B12 (Completed)    Other Visit Diagnoses    Encounter for immunization    -  Primary    Skin lesion        Relevant Orders    Ambulatory referral to Dermatology    Need for 23-polyvalent pneumococcal polysaccharide vaccine        Relevant Orders    Pneumococcal polysaccharide vaccine 23-valent greater than or equal to 2yo subcutaneous/IM (Completed)       I am having Ms. Evers maintain her acetaminophen, Probiotic Product (PROBIOTIC DAILY PO), Naproxen Sodium, levothyroxine, loratadine, Triamcinolone Acetonide (NASACORT AQ NA), diphenhydrAMINE, and omeprazole.  No orders of the defined types were placed in this encounter.     Penni Homans, MD

## 2015-07-21 NOTE — Assessment & Plan Note (Signed)
hgba1c acceptable, minimize simple carbs. Increase exercise as tolerated.  

## 2015-07-21 NOTE — Assessment & Plan Note (Signed)
Avoid offending foods, start probiotics. Do not eat large meals in late evening and consider raising head of bed.  

## 2015-07-21 NOTE — Assessment & Plan Note (Signed)
On Levothyroxine, continue to monitor 

## 2015-07-21 NOTE — Assessment & Plan Note (Signed)
ANA positive, patient doing well

## 2015-07-23 ENCOUNTER — Other Ambulatory Visit: Payer: Self-pay | Admitting: Nephrology

## 2015-07-23 DIAGNOSIS — I129 Hypertensive chronic kidney disease with stage 1 through stage 4 chronic kidney disease, or unspecified chronic kidney disease: Secondary | ICD-10-CM

## 2015-07-29 ENCOUNTER — Encounter: Payer: Self-pay | Admitting: Medical

## 2015-07-31 ENCOUNTER — Ambulatory Visit
Admission: RE | Admit: 2015-07-31 | Discharge: 2015-07-31 | Disposition: A | Discharge status: Home / Self Care | Payer: PPO | Source: Ambulatory Visit | Attending: Nephrology | Admitting: Nephrology

## 2015-07-31 DIAGNOSIS — I129 Hypertensive chronic kidney disease with stage 1 through stage 4 chronic kidney disease, or unspecified chronic kidney disease: Secondary | ICD-10-CM

## 2015-08-01 ENCOUNTER — Other Ambulatory Visit: Payer: Self-pay | Admitting: Family Medicine

## 2015-08-01 ENCOUNTER — Encounter: Payer: Self-pay | Admitting: Medical

## 2015-08-01 NOTE — Telephone Encounter (Signed)
Pt was last seen on 07/11/15.  Last refill was 04/15/15. Last TSH was 07/11/15 and it was stable.  Please advise if ok to refill.

## 2015-08-06 MED ORDER — LEVOTHYROXINE SODIUM 25 MCG PO TABS: 25.0000 ug | ORAL_TABLET | Freq: Every day | ORAL | Status: DC

## 2015-08-06 NOTE — Addendum Note (Signed)
Addended by: Tasia Catchings on: 08/06/2015 08:48 AM   Modules accepted: Orders

## 2015-08-15 ENCOUNTER — Other Ambulatory Visit: Payer: Self-pay | Admitting: Family Medicine

## 2015-08-15 MED ORDER — LEVOTHYROXINE SODIUM 25 MCG PO TABS: 25.0000 ug | ORAL_TABLET | Freq: Every day | ORAL | Status: DC

## 2015-09-03 ENCOUNTER — Other Ambulatory Visit: Payer: Self-pay | Admitting: Family Medicine

## 2015-09-03 ENCOUNTER — Encounter: Payer: Self-pay | Admitting: Family Medicine

## 2015-09-03 DIAGNOSIS — M810 Age-related osteoporosis without current pathological fracture: Secondary | ICD-10-CM

## 2015-09-17 ENCOUNTER — Ambulatory Visit (HOSPITAL_BASED_OUTPATIENT_CLINIC_OR_DEPARTMENT_OTHER)
Admission: RE | Admit: 2015-09-17 | Discharge: 2015-09-17 | Disposition: A | Discharge status: Home / Self Care | Payer: PPO | Source: Ambulatory Visit | Attending: Family Medicine | Admitting: Family Medicine

## 2015-09-17 DIAGNOSIS — Z87891 Personal history of nicotine dependence: Secondary | ICD-10-CM | POA: Diagnosis not present

## 2015-09-17 DIAGNOSIS — M810 Age-related osteoporosis without current pathological fracture: Secondary | ICD-10-CM | POA: Diagnosis not present

## 2015-09-17 DIAGNOSIS — E039 Hypothyroidism, unspecified: Secondary | ICD-10-CM | POA: Insufficient documentation

## 2015-09-17 DIAGNOSIS — Z90710 Acquired absence of both cervix and uterus: Secondary | ICD-10-CM | POA: Diagnosis not present

## 2015-09-17 DIAGNOSIS — Z780 Asymptomatic menopausal state: Secondary | ICD-10-CM | POA: Insufficient documentation

## 2015-09-19 DIAGNOSIS — L570 Actinic keratosis: Secondary | ICD-10-CM | POA: Diagnosis not present

## 2015-09-19 DIAGNOSIS — L821 Other seborrheic keratosis: Secondary | ICD-10-CM | POA: Diagnosis not present

## 2015-09-19 DIAGNOSIS — Z23 Encounter for immunization: Secondary | ICD-10-CM | POA: Diagnosis not present

## 2015-09-19 DIAGNOSIS — L309 Dermatitis, unspecified: Secondary | ICD-10-CM | POA: Diagnosis not present

## 2015-09-19 DIAGNOSIS — L814 Other melanin hyperpigmentation: Secondary | ICD-10-CM | POA: Diagnosis not present

## 2015-09-23 ENCOUNTER — Other Ambulatory Visit (INDEPENDENT_AMBULATORY_CARE_PROVIDER_SITE_OTHER): Payer: PPO

## 2015-09-23 DIAGNOSIS — E538 Deficiency of other specified B group vitamins: Secondary | ICD-10-CM | POA: Diagnosis not present

## 2015-09-23 LAB — VITAMIN B12: VITAMIN B 12: 631 pg/mL (ref 211–911)

## 2015-09-25 DIAGNOSIS — I129 Hypertensive chronic kidney disease with stage 1 through stage 4 chronic kidney disease, or unspecified chronic kidney disease: Secondary | ICD-10-CM | POA: Diagnosis not present

## 2015-09-25 DIAGNOSIS — D631 Anemia in chronic kidney disease: Secondary | ICD-10-CM | POA: Diagnosis not present

## 2015-09-25 DIAGNOSIS — N2581 Secondary hyperparathyroidism of renal origin: Secondary | ICD-10-CM | POA: Diagnosis not present

## 2015-09-30 ENCOUNTER — Encounter: Payer: Self-pay | Admitting: Medical

## 2015-10-05 ENCOUNTER — Encounter: Payer: Self-pay | Admitting: Family Medicine

## 2015-10-08 ENCOUNTER — Telehealth: Payer: Self-pay | Admitting: Family Medicine

## 2015-10-08 NOTE — Telephone Encounter (Signed)
Rose, This patient would like to begin getting Prolia injections.  Could you please begin the authorization process for her.  Thanks for all you do Thanks, Shirlean Mylar

## 2015-10-18 NOTE — Telephone Encounter (Signed)
I have electronically submitted pt's info for Prolia insurance verification and will notify you once I have a response. Thank you. °

## 2015-10-29 NOTE — Telephone Encounter (Signed)
I have rec'd Molly Williams's insurance verification for Prolia, however [employer] is requiring a prior auth.  I have completed the necessary form and faxed to [employer] along w/Dexa scan from 09/17/2015.  I will notify you once I have a response.  Thank you.

## 2015-11-05 DIAGNOSIS — R768 Other specified abnormal immunological findings in serum: Secondary | ICD-10-CM | POA: Diagnosis not present

## 2015-11-05 DIAGNOSIS — D6862 Lupus anticoagulant syndrome: Secondary | ICD-10-CM | POA: Diagnosis not present

## 2015-11-05 DIAGNOSIS — D619 Aplastic anemia, unspecified: Secondary | ICD-10-CM | POA: Diagnosis not present

## 2015-11-05 DIAGNOSIS — D649 Anemia, unspecified: Secondary | ICD-10-CM | POA: Diagnosis not present

## 2015-11-18 NOTE — Telephone Encounter (Signed)
Since I have not rec'd notification from [employer] in ref to Ms. Foot's authorization for Prolia, I called them today.  Per Anderson Malta @ [employer] Ms. Ardolino has been approved for ONE injection, eff 11/05/2015-05/05/2016, Josem Kaufmann [id number].  Ms. Langlie est responsibility will be $195 w/out an OV; w/an OV it will be $215.  Please make pt aware this is an estimate and we will not know an exact amt until insurance(s) has/have paid.  I have sent a copy of the summary of benefits to be scanned into pt's chart.    If pt cannot afford [phone] for her injection, please advise her to contact Prol[employer] at [phone] and select option #1 to see if she qualifies for one of their assistance programs.  If she qualifies they will instruct her how to proceed.  If you have any questions, please let me know. Thank you.mt until insurance(s) has/have paid.  I have sent a copy of the summary of benefits to be scanned into pt's chart.    Once pt recs injection, please let me know actual injection date so I can update the Prolia portal.  If you have any questions, please let me know.  Than you.

## 2015-11-18 NOTE — Telephone Encounter (Signed)
Called the patient informed her of all information regarding authorization.she wants to call the 800 number and find out if she is eligible for any assistance.  She will call back once finding this information out and let me know what she wants to do.

## 2015-12-12 ENCOUNTER — Other Ambulatory Visit: Payer: Self-pay | Admitting: Family Medicine

## 2016-01-09 ENCOUNTER — Ambulatory Visit (HOSPITAL_BASED_OUTPATIENT_CLINIC_OR_DEPARTMENT_OTHER)
Admission: RE | Admit: 2016-01-09 | Discharge: 2016-01-09 | Disposition: A | Discharge status: Home / Self Care | Payer: PPO | Source: Ambulatory Visit | Attending: Family Medicine | Admitting: Family Medicine

## 2016-01-09 ENCOUNTER — Ambulatory Visit (INDEPENDENT_AMBULATORY_CARE_PROVIDER_SITE_OTHER): Payer: PPO | Admitting: Family Medicine

## 2016-01-09 ENCOUNTER — Encounter: Payer: Self-pay | Admitting: Family Medicine

## 2016-01-09 VITALS — BP 120/62 | HR 67 | Temp 98.4°F | Ht 63.0 in | Wt 114.4 lb

## 2016-01-09 DIAGNOSIS — Z1231 Encounter for screening mammogram for malignant neoplasm of breast: Secondary | ICD-10-CM | POA: Diagnosis not present

## 2016-01-09 DIAGNOSIS — E079 Disorder of thyroid, unspecified: Secondary | ICD-10-CM | POA: Diagnosis not present

## 2016-01-09 DIAGNOSIS — L989 Disorder of the skin and subcutaneous tissue, unspecified: Secondary | ICD-10-CM

## 2016-01-09 DIAGNOSIS — R739 Hyperglycemia, unspecified: Secondary | ICD-10-CM

## 2016-01-09 DIAGNOSIS — D649 Anemia, unspecified: Secondary | ICD-10-CM

## 2016-01-09 DIAGNOSIS — E559 Vitamin D deficiency, unspecified: Secondary | ICD-10-CM

## 2016-01-09 DIAGNOSIS — C44310 Basal cell carcinoma of skin of unspecified parts of face: Secondary | ICD-10-CM | POA: Insufficient documentation

## 2016-01-09 DIAGNOSIS — M810 Age-related osteoporosis without current pathological fracture: Secondary | ICD-10-CM

## 2016-01-09 DIAGNOSIS — Z1239 Encounter for other screening for malignant neoplasm of breast: Secondary | ICD-10-CM | POA: Insufficient documentation

## 2016-01-09 DIAGNOSIS — E538 Deficiency of other specified B group vitamins: Secondary | ICD-10-CM

## 2016-01-09 DIAGNOSIS — K219 Gastro-esophageal reflux disease without esophagitis: Secondary | ICD-10-CM

## 2016-01-09 HISTORY — DX: Deficiency of other specified B group vitamins: E53.8

## 2016-01-09 HISTORY — DX: Disorder of the skin and subcutaneous tissue, unspecified: L98.9

## 2016-01-09 LAB — LIPID PANEL
CHOL/HDL RATIO: 2
Cholesterol: 228 mg/dL — ABNORMAL HIGH (ref 0–200)
HDL: 97.9 mg/dL (ref >39.00)
LDL Cholesterol: 113 mg/dL — ABNORMAL HIGH (ref 0–99)
NONHDL: 129.95
Triglycerides: 83 mg/dL (ref 0.0–149.0)
VLDL: 16.6 mg/dL (ref 0.0–40.0)

## 2016-01-09 LAB — COMPREHENSIVE METABOLIC PANEL
ALK PHOS: 58 U/L (ref 39–117)
ALT: 12 U/L (ref 0–35)
AST: 18 U/L (ref 0–37)
Albumin: 4.3 g/dL (ref 3.5–5.2)
BUN: 20 mg/dL (ref 6–23)
CHLORIDE: 100 meq/L (ref 96–112)
CO2: 28 mEq/L (ref 19–32)
Calcium: 9.6 mg/dL (ref 8.4–10.5)
Creatinine, Ser: 0.95 mg/dL (ref 0.40–1.20)
GFR: 59.73 mL/min — AB (ref >60.00)
GLUCOSE: 89 mg/dL (ref 70–99)
POTASSIUM: 4.1 meq/L (ref 3.5–5.1)
Sodium: 135 mEq/L (ref 135–145)
TOTAL PROTEIN: 7.6 g/dL (ref 6.0–8.3)
Total Bilirubin: 0.5 mg/dL (ref 0.2–1.2)

## 2016-01-09 LAB — CBC
HCT: 34.6 % — ABNORMAL LOW (ref 36.0–46.0)
Hemoglobin: 11.9 g/dL — ABNORMAL LOW (ref 12.0–15.0)
MCHC: 34.3 g/dL (ref 30.0–36.0)
MCV: 94.6 fl (ref 78.0–100.0)
PLATELETS: 310 10*3/uL (ref 150.0–400.0)
RBC: 3.66 Mil/uL — AB (ref 3.87–5.11)
RDW: 14.4 % (ref 11.5–15.5)
WBC: 8.9 10*3/uL (ref 4.0–10.5)

## 2016-01-09 LAB — VITAMIN D 25 HYDROXY (VIT D DEFICIENCY, FRACTURES): VITD: 35.03 ng/mL (ref 30.00–100.00)

## 2016-01-09 LAB — VITAMIN B12: VITAMIN B 12: 465 pg/mL (ref 211–911)

## 2016-01-09 LAB — TSH: TSH: 3.02 u[IU]/mL (ref 0.35–4.50)

## 2016-01-09 LAB — HEMOGLOBIN A1C: HEMOGLOBIN A1C: 5.6 % (ref 4.6–6.5)

## 2016-01-09 NOTE — Assessment & Plan Note (Addendum)
Is questioning whether ornot to proceed with Prolia treatments or not. She did not tolerate Fosamax, caused pain and weakness. Takes Vitamin D 2000 IU but is only getting one serving of calcium daily, encouraged to increase to 3 servings has taken calcium in past and it caused constipation but is willing to try again. Agrees to try Prolia today after discussion of risk and benefits.

## 2016-01-09 NOTE — Assessment & Plan Note (Addendum)
Avoid offending foods, take probiotics, doing much better on [facility]. Do not eat large meals in late evening and consider raising head of bed.

## 2016-01-09 NOTE — Assessment & Plan Note (Signed)
Has seen Dr Renda Rolls and they ablated the lesion and she is to return if lesin regrows

## 2016-01-09 NOTE — Progress Notes (Signed)
Pre visit review using our clinic review tool, if applicable. No additional management support is needed unless otherwise documented below in the visit note. 

## 2016-01-09 NOTE — Assessment & Plan Note (Signed)
On Levothyroxine, continue to monitor 

## 2016-01-09 NOTE — Assessment & Plan Note (Signed)
Doing well on supplements, no need for changes, continue to monitor

## 2016-01-09 NOTE — Progress Notes (Signed)
Subjective:    Patient ID: Molly Williams, female    DOB: 1931-03-19, 82 y.o.   MRN: [id number]  Chief Complaint  Patient presents with  . Follow-up    HPI Patient is in today for follow up. Patient reports seeing a dermatologist and has some concerns about skin tag on face, patient reports having a cryosurgery about 3 months ago and will continue to follow up with Dr. Renda Rolls to make sure the skin growth does not come back. No recent illness or acute concerns. Denies CP/palp/SOB/HA/congestion/fevers/GI or GU c/o. Taking meds as prescribed  Past Medical History  Diagnosis Date  . Migraine aura without headache     h/o ocular migraines  . Osteopenia   . Spinal stenosis   . Retinal detachment with break     right/laser  . GERD (gastroesophageal reflux disease)   . Hearing loss sensory, bilateral     bil/hearing aids  . Arthritis   . Urinary tract bacterial infections     has had urethral dilations in past  . Atrophic vaginitis   . Lupus (systemic lupus erythematosus) (HCC)     Dr. Charlestine Night  . Hematuria     chronic Dr. Burnell Blanks  . Hearing loss   . Fibrocystic breast disease     bx 2008 neg per pt report  . Thyroid disease     hypothyroidism  . Cholelithiasis 2008  . Renal insufficiency 03/29/2015  . Anemia 03/29/2015  . Vitamin D deficiency 07/11/2015  . Skin lesion of face 01/09/2016  . Vitamin B12 deficiency 01/09/2016    Past Surgical History  Procedure Laterality Date  . Cataract extraction w/ intraocular lens  implant, bilateral    . Vaginal hysterectomy      partial  . Cholecystectomy    . Tonsillectomy  1940  . Appendectomy  2006    Family History  Problem Relation Age of Onset  . Stroke Paternal Grandmother   . Stroke Father   . Coronary artery disease Father   . Coronary artery disease Mother   . Stroke Mother   . Cancer Paternal Aunt     GYN cancer  . Cancer Paternal Uncle     lung, smoker  . Stroke Maternal Grandmother   . Stroke Maternal  Grandfather   . Heart disease Paternal Grandfather   . Hydrocephalus Brother     normopressure hydrocephalus, dementia    Social History   Social History  . Marital Status: Widowed    Spouse Name: N/A  . Number of Children: N/A  . Years of Education: N/A   Occupational History  . Not on file.   Social History Main Topics  . Smoking status: Former Research scientist (life sciences)  . Smokeless tobacco: Never Used  . Alcohol Use: 6.0 oz/week    10 Glasses of wine per week  . Drug Use: No  . Sexual Activity: No     Comment: lives alone here because her daughter lives hier, no dietary restrictions, avoids dairy   Other Topics Concern  . Not on file   Social History Narrative    Outpatient Prescriptions Prior to Visit  Medication Sig Dispense Refill  . acetaminophen (TYLENOL) 500 MG tablet Take 1,000 mg by mouth every 6 (six) hours as needed. pain     . diphenhydrAMINE (BENADRYL) 25 mg capsule Take 25 mg by mouth every 6 (six) hours as needed.    Marland Kitchen levothyroxine (SYNTHROID, LEVOTHROID) 25 MCG tablet Take 1 tablet (25 mcg total) by mouth daily. 90 tablet  1  . loratadine (CLARITIN) 10 MG tablet Take 10 mg by mouth daily.    . Naproxen Sodium 220 MG CAPS Take by mouth.    Marland Kitchen omeprazole (PRILOSEC) 20 MG capsule TAKE ONE CAPSULE BY MOUTH TWICE DAILY 60 capsule 2  . Probiotic Product (PROBIOTIC DAILY PO) Take by mouth.    . Triamcinolone Acetonide (NASACORT AQ NA) Place into the nose.     No facility-administered medications prior to visit.    Allergies  Allergen Reactions  . Alendronate   . Bacitracin Swelling    Ophthalmic ointment  . Codeine Nausea And Vomiting  . Iodine Hives    Contrast dye    Review of Systems  Constitutional: Negative for fever and malaise/fatigue.  HENT: Negative for congestion.   Eyes: Negative for blurred vision.  Respiratory: Negative for shortness of breath.   Cardiovascular: Negative for chest pain, palpitations and leg swelling.  Gastrointestinal: Negative for  nausea, abdominal pain and blood in stool.  Genitourinary: Negative for dysuria and frequency.  Musculoskeletal: Negative for falls.  Skin: Negative for rash.  Neurological: Negative for dizziness, loss of consciousness and headaches.  Endo/Heme/Allergies: Negative for environmental allergies.  Psychiatric/Behavioral: Negative for depression. The patient is not nervous/anxious.        Objective:    Physical Exam  Constitutional: She is oriented to person, place, and time. She appears well-developed and well-nourished. No distress.  HENT:  Head: Normocephalic and atraumatic.  Eyes: Conjunctivae are normal.  Neck: Neck supple. No thyromegaly present.  Cardiovascular: Normal rate, regular rhythm and normal heart sounds.   Pulmonary/Chest: Effort normal and breath sounds normal. No respiratory distress.  Abdominal: Soft. Bowel sounds are normal. She exhibits no distension and no mass. There is no tenderness.  Musculoskeletal: She exhibits no edema.  Lymphadenopathy:    She has no cervical adenopathy.  Neurological: She is alert and oriented to person, place, and time.  Skin: Skin is warm and dry.  Psychiatric: She has a normal mood and affect. Her behavior is normal.    BP 120/62 mmHg  Pulse 67  Temp(Src) 98.4 F (36.9 C) (Oral)  Ht 5\' 3"  (1.6 m)  Wt 114 lb 6 oz (51.88 kg)  BMI 20.27 kg/m2  SpO2 96% Wt Readings from Last 3 Encounters:  01/09/16 114 lb 6 oz (51.88 kg)  07/11/15 113 lb 2 oz (51.313 kg)  03/29/15 114 lb 2 oz (51.767 kg)     Lab Results  Component Value Date   WBC 6.6 07/11/2015   HGB 12.0 07/11/2015   HCT 35.8* 07/11/2015   PLT 321.0 07/11/2015   GLUCOSE 95 07/11/2015   CHOL 235* 10/23/2014   TRIG 80 10/23/2014   HDL 89 10/23/2014   LDLCALC 130* 10/23/2014   ALT 19 07/11/2015   AST 23 07/11/2015   NA 139 07/11/2015   K 4.8 07/11/2015   CL 105 07/11/2015   CREATININE 1.03 07/11/2015   BUN 18 07/11/2015   CO2 27 07/11/2015   TSH 3.08 07/11/2015     INR 0.94 09/22/2014   HGBA1C 5.6 07/11/2015    Lab Results  Component Value Date   TSH 3.08 07/11/2015   Lab Results  Component Value Date   WBC 6.6 07/11/2015   HGB 12.0 07/11/2015   HCT 35.8* 07/11/2015   MCV 96.3 07/11/2015   PLT 321.0 07/11/2015   Lab Results  Component Value Date   NA 139 07/11/2015   K 4.8 07/11/2015   CO2 27 07/11/2015   GLUCOSE  95 07/11/2015   BUN 18 07/11/2015   CREATININE 1.03 07/11/2015   BILITOT 0.3 07/11/2015   ALKPHOS 63 07/11/2015   AST 23 07/11/2015   ALT 19 07/11/2015   PROT 7.8 07/11/2015   ALBUMIN 4.3 07/11/2015   CALCIUM 9.8 07/11/2015   ANIONGAP 8 09/22/2014   GFR 54.48* 07/11/2015   Lab Results  Component Value Date   CHOL 235* 10/23/2014   Lab Results  Component Value Date   HDL 89 10/23/2014   Lab Results  Component Value Date   LDLCALC 130* 10/23/2014   Lab Results  Component Value Date   TRIG 80 10/23/2014   Lab Results  Component Value Date   CHOLHDL 2.6 10/23/2014   Lab Results  Component Value Date   HGBA1C 5.6 07/11/2015       Assessment & Plan:   Problem List Items Addressed This Visit    Anemia   Relevant Orders   Lipid panel   CBC   Comprehensive metabolic panel   TSH   VITAMIN D 25 Hydroxy (Vit-D Deficiency, Fractures)   Hemoglobin A1c   B12   GERD (gastroesophageal reflux disease)    Avoid offending foods, take probiotics, doing much better on [employer]. Do not eat large meals in late evening and consider raising head of bed.       Relevant Orders   Lipid panel   CBC   Comprehensive metabolic panel   TSH   VITAMIN D 25 Hydroxy (Vit-D Deficiency, Fractures)   Hemoglobin A1c   B12   Hyperglycemia     minimize simple carbs. Increase exercise as tolerated. Check hgba1c      Relevant Orders   Lipid panel   CBC   Comprehensive metabolic panel   TSH   VITAMIN D 25 Hydroxy (Vit-D Deficiency, Fractures)   Hemoglobin A1c   B12   Osteoporosis - Primary    Is  questioning whether ornot to proceed with Prolia treatments or not. She did not tolerate Fosamax, caused pain and weakness. Takes Vitamin D 2000 IU but is only getting one serving of calcium daily, encouraged to increase to 3 servings has taken calcium in past and it caused constipation but is willing to try again. Agrees to try Prolia today after discussion of risk and benefits.       Relevant Orders   Lipid panel   CBC   Comprehensive metabolic panel   TSH   VITAMIN D 25 Hydroxy (Vit-D Deficiency, Fractures)   Hemoglobin A1c   B12   Skin lesion of face    Has seen Dr Renda Rolls and they ablated the lesion and she is to return if lesin regrows      Thyroid disease    On Levothyroxine, continue to monitor      Relevant Orders   Lipid panel   CBC   Comprehensive metabolic panel   TSH   VITAMIN D 25 Hydroxy (Vit-D Deficiency, Fractures)   Hemoglobin A1c   B12   Vitamin B12 deficiency    Doing well on supplements, no need for changes, continue to monitor      Vitamin D deficiency    Tolerating supplements, continue same and monitor      Relevant Orders   Lipid panel   CBC   Comprehensive metabolic panel   TSH   VITAMIN D 25 Hydroxy (Vit-D Deficiency, Fractures)   Hemoglobin A1c   B12    Other Visit Diagnoses    Breast cancer screening  Relevant Orders    MM SCREENING BREAST TOMO BILATERAL       I am having Ms. Mutchler maintain her acetaminophen, Probiotic Product (PROBIOTIC DAILY PO), Naproxen Sodium, loratadine, Triamcinolone Acetonide (NASACORT AQ NA), diphenhydrAMINE, levothyroxine, and omeprazole.  No orders of the defined types were placed in this encounter.     Penni Homans, MD

## 2016-01-09 NOTE — Patient Instructions (Addendum)
Calcium 3 servings a day and try calcium citrate vitamin. Osteoporosis Osteoporosis is the thinning and loss of density in the bones. Osteoporosis makes the bones more brittle, fragile, and likely to break (fracture). Over time, osteoporosis can cause the bones to become so weak that they fracture after a simple fall. The bones most likely to fracture are the bones in the hip, wrist, and spine. CAUSES  The exact cause is not known. RISK FACTORS Anyone can develop osteoporosis. You may be at greater risk if you have a family history of the condition or have poor nutrition. You may also have a higher risk if you are:   Female.   73 years old or older.  A smoker.  Not physically active.   White or Asian.  Slender. SIGNS AND SYMPTOMS  A fracture might be the first sign of the disease, especially if it results from a fall or injury that would not usually cause a bone to break. Other signs and symptoms include:   Low back and neck pain.  Stooped posture.  Height loss. DIAGNOSIS  To make a diagnosis, your health care provider may:  Take a medical history.  Perform a physical exam.  Order tests, such as:  A bone mineral density test.  A dual-energy X-ray absorptiometry test. TREATMENT  The goal of osteoporosis treatment is to strengthen your bones to reduce your risk of a fracture. Treatment may involve:  Making lifestyle changes, such as:  Eating a diet rich in calcium.  Doing weight-bearing and muscle-strengthening exercises.  Stopping tobacco use.  Limiting alcohol intake.  Taking medicine to slow the process of bone loss or to increase bone density.  Monitoring your levels of calcium and vitamin D. HOME CARE INSTRUCTIONS  Include calcium and vitamin D in your diet. Calcium is important for bone health, and vitamin D helps the body absorb calcium.  Perform weight-bearing and muscle-strengthening exercises as directed by your health care provider.  Do not use  any tobacco products, including cigarettes, chewing tobacco, and electronic cigarettes. If you need help quitting, ask your health care provider.  Limit your alcohol intake.  Take medicines only as directed by your health care provider.  Keep all follow-up visits as directed by your health care provider. This is important.  Take precautions at home to lower your risk of falling, such as:  Keeping rooms well lit and clutter free.  Installing safety rails on stairs.  Using rubber mats in the bathroom and other areas that are often wet or slippery. SEEK IMMEDIATE MEDICAL CARE IF:  You fall or injure yourself.    This information is not intended to replace advice given to you by your health care provider. Make sure you discuss any questions you have with your health care provider.   Document Released: 06/10/2005 Document Revised: 09/21/2014 Document Reviewed: 02/08/2014 Elsevier Interactive Patient Education [employer].

## 2016-01-09 NOTE — Assessment & Plan Note (Signed)
Tolerating supplements, continue same and monitor

## 2016-01-09 NOTE — Assessment & Plan Note (Signed)
minimize simple carbs. Increase exercise as tolerated. Check [id number] 

## 2016-01-15 DIAGNOSIS — Z961 Presence of intraocular lens: Secondary | ICD-10-CM | POA: Diagnosis not present

## 2016-01-15 DIAGNOSIS — H353132 Nonexudative age-related macular degeneration, bilateral, intermediate dry stage: Secondary | ICD-10-CM | POA: Diagnosis not present

## 2016-01-15 DIAGNOSIS — H26491 Other secondary cataract, right eye: Secondary | ICD-10-CM | POA: Diagnosis not present

## 2016-01-16 ENCOUNTER — Ambulatory Visit (INDEPENDENT_AMBULATORY_CARE_PROVIDER_SITE_OTHER): Payer: PPO | Admitting: *Deleted

## 2016-01-16 DIAGNOSIS — M810 Age-related osteoporosis without current pathological fracture: Secondary | ICD-10-CM

## 2016-01-16 MED ORDER — DENOSUMAB 60 MG/ML ~~LOC~~ SOLN
60.0000 mg | Freq: Once | SUBCUTANEOUS | Status: AC
  Administered 2016-01-16: 60 mg via SUBCUTANEOUS

## 2016-01-16 NOTE — Progress Notes (Signed)
Pre visit review using our clinic review tool, if applicable. No additional management support is needed unless otherwise documented below in the visit note.  Pt here for first Prolia injection. Pt medication guide reviewed and given to pt. Verified that pt is taking Vitamin D supplement. Pt tolerated injection well. No S/S of a reaction upon leaving the clinic.   Dorrene German, RN

## 2016-01-31 ENCOUNTER — Other Ambulatory Visit: Payer: Self-pay | Admitting: Family Medicine

## 2016-05-04 ENCOUNTER — Other Ambulatory Visit: Payer: Self-pay | Admitting: Family Medicine

## 2016-05-14 DIAGNOSIS — R769 Abnormal immunological finding in serum, unspecified: Secondary | ICD-10-CM | POA: Diagnosis not present

## 2016-05-14 DIAGNOSIS — Z8619 Personal history of other infectious and parasitic diseases: Secondary | ICD-10-CM | POA: Diagnosis not present

## 2016-05-14 DIAGNOSIS — D649 Anemia, unspecified: Secondary | ICD-10-CM | POA: Diagnosis not present

## 2016-05-14 DIAGNOSIS — R760 Raised antibody titer: Secondary | ICD-10-CM | POA: Diagnosis not present

## 2016-05-14 DIAGNOSIS — K5900 Constipation, unspecified: Secondary | ICD-10-CM | POA: Diagnosis not present

## 2016-06-29 ENCOUNTER — Ambulatory Visit (INDEPENDENT_AMBULATORY_CARE_PROVIDER_SITE_OTHER): Payer: PPO

## 2016-06-29 DIAGNOSIS — Z23 Encounter for immunization: Secondary | ICD-10-CM

## 2016-08-01 ENCOUNTER — Other Ambulatory Visit: Payer: Self-pay | Admitting: Family Medicine

## 2016-08-03 MED ORDER — LEVOTHYROXINE SODIUM 25 MCG PO TABS: 25.0000 ug | ORAL_TABLET | Freq: Every day | ORAL | 3 refills | Status: DC

## 2016-09-16 ENCOUNTER — Telehealth: Payer: Self-pay | Admitting: Family Medicine

## 2016-09-16 NOTE — Telephone Encounter (Signed)
Called informed the patient of her cost.  She stated she was given a grant through [employer] (patient assistance network) for this shot.  She will call [employer] tomorrow 09/17/16 to follow-up with them to make sure she is up to date on this grant. She is scheduled to see PCP on 10/08/16 (if she does decide to follow through with injection will get at her appt.).

## 2016-09-16 NOTE — Telephone Encounter (Signed)
Prolia benefits verified for 2018 No PA required Admin fee and Prolia are subject to 20% co-insurance $10 copay if OV charged Patient may owe approximately $230 out of pocket

## 2016-09-18 NOTE — Telephone Encounter (Signed)
Patient called in today 09/18/16 to have [place] order her prolia and she will get it on the 25th when here for appt.

## 2016-10-02 ENCOUNTER — Encounter: Payer: Self-pay | Admitting: Family Medicine

## 2016-10-08 ENCOUNTER — Encounter: Payer: PPO | Admitting: Family Medicine

## 2016-10-08 ENCOUNTER — Telehealth: Payer: Self-pay | Admitting: Family Medicine

## 2016-10-08 ENCOUNTER — Encounter: Payer: Self-pay | Admitting: Family Medicine

## 2016-10-08 NOTE — Telephone Encounter (Signed)
No charge but if she is ill she may need to be seen if she is not improving

## 2016-10-08 NOTE — Telephone Encounter (Signed)
Pt called in, lvm at 8:34 to make provider aware that she will not be at her appt today. She says that she have a virus.

## 2016-10-09 ENCOUNTER — Emergency Department (HOSPITAL_BASED_OUTPATIENT_CLINIC_OR_DEPARTMENT_OTHER)
Admission: EM | Admit: 2016-10-09 | Discharge: 2016-10-09 | Disposition: A | Discharge status: Home / Self Care | Payer: PPO | Attending: Emergency Medicine | Admitting: Emergency Medicine

## 2016-10-09 ENCOUNTER — Encounter (HOSPITAL_BASED_OUTPATIENT_CLINIC_OR_DEPARTMENT_OTHER): Payer: Self-pay | Admitting: *Deleted

## 2016-10-09 ENCOUNTER — Emergency Department (HOSPITAL_BASED_OUTPATIENT_CLINIC_OR_DEPARTMENT_OTHER): Payer: PPO

## 2016-10-09 DIAGNOSIS — E039 Hypothyroidism, unspecified: Secondary | ICD-10-CM | POA: Diagnosis not present

## 2016-10-09 DIAGNOSIS — R05 Cough: Secondary | ICD-10-CM | POA: Insufficient documentation

## 2016-10-09 DIAGNOSIS — Z87891 Personal history of nicotine dependence: Secondary | ICD-10-CM | POA: Diagnosis not present

## 2016-10-09 DIAGNOSIS — J3489 Other specified disorders of nose and nasal sinuses: Secondary | ICD-10-CM | POA: Diagnosis not present

## 2016-10-09 DIAGNOSIS — J111 Influenza due to unidentified influenza virus with other respiratory manifestations: Secondary | ICD-10-CM | POA: Diagnosis not present

## 2016-10-09 DIAGNOSIS — R69 Illness, unspecified: Secondary | ICD-10-CM

## 2016-10-09 DIAGNOSIS — Z79899 Other long term (current) drug therapy: Secondary | ICD-10-CM | POA: Diagnosis not present

## 2016-10-09 MED ORDER — AEROCHAMBER PLUS FLO-VU MEDIUM MISC
1.0000 | Freq: Once | Status: AC
  Administered 2016-10-09: 1
  Filled 2016-10-09: qty 1

## 2016-10-09 MED ORDER — ALBUTEROL SULFATE HFA 108 (90 BASE) MCG/ACT IN AERS
1.0000 | INHALATION_SPRAY | RESPIRATORY_TRACT | Status: DC | PRN
  Administered 2016-10-09: 2 via RESPIRATORY_TRACT
  Filled 2016-10-09: qty 6.7

## 2016-10-09 MED ORDER — OXYMETAZOLINE HCL 0.05 % NA SOLN
1.0000 | Freq: Once | NASAL | Status: AC
  Administered 2016-10-09: 1 via NASAL
  Filled 2016-10-09: qty 15

## 2016-10-09 NOTE — Discharge Instructions (Signed)
Return for worsening of symptoms.

## 2016-10-09 NOTE — ED Provider Notes (Signed)
[facility] DEPT [facility] Provider Note   CSN: [id number] Arrival date & time: 10/09/16  1348     History   Chief Complaint Chief Complaint  Patient presents with  . Cough    HPI Molly Williams is a 83 y.o. female.  Pt presents to the ED with cough and sinus congestion.  She said that she has been coughing so hard that she's throwing up.  The pt said that she is worried that she is developing pneumonia.  Pt has been taking tylenol and ibuprofen at home, but no known fever.   Pt has been able to eat and drink.  Sx started on the 24th.      Past Medical History:  Diagnosis Date  . Anemia 03/29/2015  . Arthritis   . Atrophic vaginitis   . Cholelithiasis 2008  . Fibrocystic breast disease    bx 2008 neg per pt report  . GERD (gastroesophageal reflux disease)   . Hearing loss   . Hearing loss sensory, bilateral    bil/hearing aids  . Hematuria    chronic Dr. Burnell Blanks  . Lupus (systemic lupus erythematosus) ([facility])    Dr. Charlestine Night  . Migraine aura without headache    h/o ocular migraines  . Osteopenia   . Renal insufficiency 03/29/2015  . Retinal detachment with break    right/laser  . Skin lesion of face 01/09/2016  . Spinal stenosis   . Thyroid disease    hypothyroidism  . Urinary tract bacterial infections    has had urethral dilations in past  . Vitamin B12 deficiency 01/09/2016  . Vitamin D deficiency 07/11/2015    Patient Active Problem List   Diagnosis Date Noted  . Skin lesion of face 01/09/2016  . Vitamin B12 deficiency 01/09/2016  . Vitamin D deficiency 07/11/2015  . Renal insufficiency 03/29/2015  . Anemia 03/29/2015  . Tick bite 01/30/2015  . Hyperglycemia 01/30/2015  . Osteoporosis 09/27/2013  . Insomnia 09/27/2013  . Allergic rhinitis 12/26/2012  . GERD (gastroesophageal reflux disease) 04/21/2011  . Thyroid disease   . Arthritis   . Urinary tract bacterial infections   . Atrophic vaginitis   . Spinal stenosis   . Fibrocystic breast disease    . Lupus (systemic lupus erythematosus) ([facility])   . Hearing loss sensory, bilateral     Past Surgical History:  Procedure Laterality Date  . APPENDECTOMY  2006  . CATARACT EXTRACTION W/ INTRAOCULAR LENS  IMPLANT, BILATERAL    . CHOLECYSTECTOMY    . TONSILLECTOMY  1940  . VAGINAL HYSTERECTOMY     partial    OB History    No data available       Home Medications    Prior to Admission medications   Medication Sig Start Date End Date Taking? Authorizing Provider  acetaminophen (TYLENOL) 500 MG tablet Take 1,000 mg by mouth every 6 (six) hours as needed. pain    Yes Historical Provider, MD  diphenhydrAMINE (BENADRYL) 25 mg capsule Take 25 mg by mouth every 6 (six) hours as needed.   Yes Historical Provider, MD  levothyroxine (SYNTHROID, LEVOTHROID) 25 MCG tablet Take 1 tablet (25 mcg total) by mouth daily. 08/15/15  Yes Mosie Lukes, MD  levothyroxine (SYNTHROID, LEVOTHROID) 25 MCG tablet Take 1 tablet by mouth daily 01/31/16  Yes Mosie Lukes, MD  levothyroxine (SYNTHROID, LEVOTHROID) 25 MCG tablet Take 1 tablet (25 mcg total) by mouth daily. 08/03/16  Yes Mosie Lukes, MD  loratadine (CLARITIN) 10 MG tablet Take 10  mg by mouth daily.   Yes Historical Provider, MD  Naproxen Sodium 220 MG CAPS Take by mouth.   Yes Historical Provider, MD  omeprazole (PRILOSEC) 20 MG capsule TAKE ONE CAPSULE BY MOUTH TWICE DAILY 12/12/15  Yes Mosie Lukes, MD  Probiotic Product (PROBIOTIC DAILY PO) Take by mouth.   Yes Historical Provider, MD  Triamcinolone Acetonide (NASACORT AQ NA) Place into the nose.   Yes Historical Provider, MD    Family History Family History  Problem Relation Age of Onset  . Stroke Paternal Grandmother   . Stroke Father   . Coronary artery disease Father   . Coronary artery disease Mother   . Stroke Mother   . Cancer Paternal Aunt     GYN cancer  . Cancer Paternal Uncle     lung, smoker  . Stroke Maternal Grandmother   . Stroke Maternal Grandfather   . Heart  disease Paternal Grandfather   . Hydrocephalus Brother     normopressure hydrocephalus, dementia    Social History Social History  Substance Use Topics  . Smoking status: Former Research scientist (life sciences)  . Smokeless tobacco: Never Used  . Alcohol use 6.0 oz/week    10 Glasses of wine per week     Allergies   Alendronate; Bacitracin; Codeine; and Iodine   Review of Systems Review of Systems  HENT: Positive for sinus pressure.   Respiratory: Positive for cough.   All other systems reviewed and are negative.    Physical Exam Updated Vital Signs BP 119/59   Pulse 77   Temp 98.1 F (36.7 C) (Oral)   Resp 16   Ht 5\' 3"  (1.6 m)   Wt 112 lb (50.8 kg)   SpO2 98%   BMI 19.84 kg/m   Physical Exam  Constitutional: She is oriented to person, place, and time. She appears well-developed and well-nourished.  HENT:  Head: Normocephalic and atraumatic.  Right Ear: External ear normal.  Left Ear: External ear normal.  Nose: Nose normal.  Mouth/Throat: Oropharynx is clear and moist.  Eyes: Conjunctivae and EOM are normal. Pupils are equal, round, and reactive to light.  Neck: Normal range of motion. Neck supple.  Cardiovascular: Normal rate, regular rhythm, normal heart sounds and intact distal pulses.   Pulmonary/Chest: Effort normal. She has wheezes.  Abdominal: Soft. Bowel sounds are normal.  Musculoskeletal: Normal range of motion.  Neurological: She is alert and oriented to person, place, and time.  Skin: Skin is warm.  Psychiatric: She has a normal mood and affect. Her behavior is normal. Judgment and thought content normal.  Nursing note and vitals reviewed.    ED Treatments / Results  Labs (all labs ordered are listed, but only abnormal results are displayed) Labs Reviewed - No data to display  EKG  EKG Interpretation None       Radiology Dg Chest 2 View  Result Date: 10/09/2016 CLINICAL DATA:  Cough, sore throat, sinus congestion. EXAM: CHEST  2 VIEW COMPARISON:  None.  FINDINGS: Cardiomediastinal silhouette is normal. Mediastinal contours appear intact. Calcific atherosclerotic disease and tortuosity of the aorta. There is no evidence of focal airspace consolidation, pleural effusion or pneumothorax. Osseous structures are without acute abnormality. Soft tissues are grossly normal. IMPRESSION: No active cardiopulmonary disease. Calcific atherosclerotic disease of the aorta. Electronically Signed   By: Fidela Salisbury M.D.   On: 10/09/2016 14:45    Procedures Procedures (including critical care time)  Medications Ordered in ED Medications  albuterol (PROVENTIL HFA;VENTOLIN HFA) 108 (90 Base)  MCG/ACT inhaler 1-2 puff (2 puffs Inhalation Given 10/09/16 1455)  AEROCHAMBER PLUS FLO-VU MEDIUM MISC 1 each (1 each Other Given 10/09/16 1455)  oxymetazoline (AFRIN) 0.05 % nasal spray 1 spray (1 spray Each Nare Given 10/09/16 1441)     Initial Impression / Assessment and Plan / ED Course  I have reviewed the triage vital signs and the nursing notes.  Pertinent labs & imaging results that were available during my care of the patient were reviewed by me and considered in my medical decision making (see chart for details).    Pt is feeling better after the afrin and albuterol.  I spoke with her about tamiflu.  As sx have been going on for over 48hrs, it probably will not work.  She opted out of the rx.  She does know to return if worse.  Final Clinical Impressions(s) / ED Diagnoses   Final diagnoses:  Influenza-like illness    New Prescriptions New Prescriptions   No medications on file     Isla Pence, MD 10/09/16 1459

## 2016-10-09 NOTE — Telephone Encounter (Signed)
lvm advising patient to call back and schedule appointment.  °

## 2016-10-09 NOTE — ED Triage Notes (Signed)
2 days of sore throat and cough. No fever.

## 2016-10-12 ENCOUNTER — Encounter: Payer: Self-pay | Admitting: Family Medicine

## 2016-10-16 ENCOUNTER — Ambulatory Visit: Payer: PPO

## 2016-10-23 ENCOUNTER — Telehealth: Payer: Self-pay

## 2016-10-23 ENCOUNTER — Ambulatory Visit (INDEPENDENT_AMBULATORY_CARE_PROVIDER_SITE_OTHER): Payer: PPO

## 2016-10-23 DIAGNOSIS — M818 Other osteoporosis without current pathological fracture: Secondary | ICD-10-CM

## 2016-10-23 MED ORDER — DENOSUMAB 60 MG/ML ~~LOC~~ SOLN
60.0000 mg | Freq: Once | SUBCUTANEOUS | Status: AC
  Administered 2016-10-23: 60 mg via SUBCUTANEOUS

## 2016-10-23 NOTE — Progress Notes (Signed)
Pre visit review using our clinic tool,if applicable. No additional management support is needed unless otherwise documented below in the visit note.   Patient in for Prolia 60 mg injection. Given IM left arm. Patient tolerated well.   Patient will be called for next injection.

## 2016-10-23 NOTE — Telephone Encounter (Signed)
Called patient, left message on answering machine that  PAN documents have been filled out ([place] portion) and mailed out to her today.

## 2016-11-17 ENCOUNTER — Encounter: Payer: Self-pay | Admitting: Family Medicine

## 2016-11-17 ENCOUNTER — Ambulatory Visit (INDEPENDENT_AMBULATORY_CARE_PROVIDER_SITE_OTHER): Payer: PPO | Admitting: Family Medicine

## 2016-11-17 VITALS — BP 118/60 | HR 60 | Temp 98.1°F | Resp 18 | Ht 62.5 in | Wt 115.8 lb

## 2016-11-17 DIAGNOSIS — E559 Vitamin D deficiency, unspecified: Secondary | ICD-10-CM

## 2016-11-17 DIAGNOSIS — I700 Atherosclerosis of aorta: Secondary | ICD-10-CM | POA: Diagnosis not present

## 2016-11-17 DIAGNOSIS — D649 Anemia, unspecified: Secondary | ICD-10-CM | POA: Diagnosis not present

## 2016-11-17 DIAGNOSIS — E079 Disorder of thyroid, unspecified: Secondary | ICD-10-CM

## 2016-11-17 DIAGNOSIS — Z87891 Personal history of nicotine dependence: Secondary | ICD-10-CM

## 2016-11-17 DIAGNOSIS — M1990 Unspecified osteoarthritis, unspecified site: Secondary | ICD-10-CM

## 2016-11-17 DIAGNOSIS — H33311 Horseshoe tear of retina without detachment, right eye: Secondary | ICD-10-CM

## 2016-11-17 DIAGNOSIS — R739 Hyperglycemia, unspecified: Secondary | ICD-10-CM | POA: Diagnosis not present

## 2016-11-17 DIAGNOSIS — M329 Systemic lupus erythematosus, unspecified: Secondary | ICD-10-CM

## 2016-11-17 DIAGNOSIS — R002 Palpitations: Secondary | ICD-10-CM | POA: Diagnosis not present

## 2016-11-17 DIAGNOSIS — L989 Disorder of the skin and subcutaneous tissue, unspecified: Secondary | ICD-10-CM

## 2016-11-17 DIAGNOSIS — Z0000 Encounter for general adult medical examination without abnormal findings: Secondary | ICD-10-CM | POA: Diagnosis not present

## 2016-11-17 DIAGNOSIS — E785 Hyperlipidemia, unspecified: Secondary | ICD-10-CM | POA: Insufficient documentation

## 2016-11-17 DIAGNOSIS — E538 Deficiency of other specified B group vitamins: Secondary | ICD-10-CM

## 2016-11-17 DIAGNOSIS — M810 Age-related osteoporosis without current pathological fracture: Secondary | ICD-10-CM | POA: Diagnosis not present

## 2016-11-17 DIAGNOSIS — K219 Gastro-esophageal reflux disease without esophagitis: Secondary | ICD-10-CM

## 2016-11-17 DIAGNOSIS — N289 Disorder of kidney and ureter, unspecified: Secondary | ICD-10-CM

## 2016-11-17 DIAGNOSIS — H3530 Unspecified macular degeneration: Secondary | ICD-10-CM

## 2016-11-17 HISTORY — DX: Horseshoe tear of retina without detachment, right eye: H33.311

## 2016-11-17 HISTORY — DX: Hyperlipidemia, unspecified: E78.5

## 2016-11-17 HISTORY — DX: Unspecified macular degeneration: H35.30

## 2016-11-17 MED ORDER — LEVOTHYROXINE SODIUM 25 MCG PO TABS: 25.0000 ug | ORAL_TABLET | Freq: Every day | ORAL | 1 refills | Status: DC

## 2016-11-17 NOTE — Assessment & Plan Note (Signed)
Avoid offending foods, start probiotics. Do not eat large meals in late evening and consider raising head of bed.  

## 2016-11-17 NOTE — Assessment & Plan Note (Signed)
Check cmp 

## 2016-11-17 NOTE — Progress Notes (Signed)
Subjective:  I acted as a Education administrator for Dr. Charlett Blake. [place], [place]   Patient ID: Molly Williams, female    DOB: 1930/11/04, 83 y.o.   MRN: [id number]  Chief Complaint  Patient presents with  . Annual Exam    HPI  Patient is in today for an annual exam and follow up on chronic medical concerns such as osteoporosis, hyperglycemia, hyperlipidemia, lupus and more. She continues to stay very active exercising most days but does endorse worsening back and joint pain, no swelling or warmth. No injury or falls. She notes a new lesion on her anterior nose on left over past few months is already scheduled with her dermatologist for evaluation. No recent acute illness or hospitalization.  Patient Care Team: Mosie Lukes, MD as PCP - General (Family Medicine) Devra Dopp, MD as Referring Physician (Dermatology)   Past Medical History:  Diagnosis Date  . Anemia 03/29/2015  . Arthritis   . Atrophic vaginitis   . Cholelithiasis 2008  . Fibrocystic breast disease    bx 2008 neg per pt report  . GERD (gastroesophageal reflux disease)   . Hearing loss   . Hearing loss sensory, bilateral    bil/hearing aids  . Hematuria    chronic Dr. Burnell Blanks  . Hyperlipidemia 11/17/2016  . Lupus (systemic lupus erythematosus) (HCC)    Dr. Charlestine Night  . Macular degeneration 11/17/2016  . Migraine aura without headache    h/o ocular migraines  . Osteopenia   . Preventative health care 11/18/2016  . Renal insufficiency 03/29/2015  . Retinal detachment with break    right/laser  . Retinal tear of right eye 11/17/2016  . Skin lesion of face 01/09/2016  . Spinal stenosis   . Thyroid disease    hypothyroidism  . Urinary tract bacterial infections    has had urethral dilations in past  . Vitamin B12 deficiency 01/09/2016  . Vitamin D deficiency 07/11/2015    Past Surgical History:  Procedure Laterality Date  . APPENDECTOMY  2006  . CATARACT EXTRACTION W/ INTRAOCULAR LENS  IMPLANT, BILATERAL    .  CHOLECYSTECTOMY    . EYE SURGERY Bilateral    cataracts  . TONSILLECTOMY  1940  . VAGINAL HYSTERECTOMY     partial    Family History  Problem Relation Age of Onset  . Stroke Paternal Grandmother   . Stroke Father   . Coronary artery disease Father   . Coronary artery disease Mother   . Stroke Mother   . Cancer Paternal Aunt     GYN cancer  . Cancer Paternal Uncle     lung, smoker  . Stroke Maternal Grandmother   . Stroke Maternal Grandfather   . Heart disease Paternal Grandfather   . Hydrocephalus Brother     normopressure hydrocephalus, dementia    Social History   Social History  . Marital status: Widowed    Spouse name: N/A  . Number of children: N/A  . Years of education: N/A   Occupational History  . Not on file.   Social History Main Topics  . Smoking status: Former Research scientist (life sciences)  . Smokeless tobacco: Never Used  . Alcohol use 6.0 oz/week    10 Glasses of wine per week  . Drug use: No  . Sexual activity: No     Comment: lives alone here because her daughter lives hier, no dietary restrictions, avoids dairy   Other Topics Concern  . Not on file   Social History Narrative  . No  narrative on file    Outpatient Medications Prior to Visit  Medication Sig Dispense Refill  . acetaminophen (TYLENOL) 500 MG tablet Take 1,000 mg by mouth every 6 (six) hours as needed. pain     . diphenhydrAMINE (BENADRYL) 25 mg capsule Take 25 mg by mouth every 6 (six) hours as needed.    . Probiotic Product (PROBIOTIC DAILY PO) Take by mouth.    . Triamcinolone Acetonide (NASACORT AQ NA) Place into the nose.    . levothyroxine (SYNTHROID, LEVOTHROID) 25 MCG tablet Take 1 tablet (25 mcg total) by mouth daily. 90 tablet 1  . loratadine (CLARITIN) 10 MG tablet Take 10 mg by mouth daily.    . Naproxen Sodium 220 MG CAPS Take by mouth.    Marland Kitchen omeprazole (PRILOSEC) 20 MG capsule TAKE ONE CAPSULE BY MOUTH TWICE DAILY 60 capsule 2   No facility-administered medications prior to visit.      Allergies  Allergen Reactions  . Alendronate   . Bacitracin Swelling    Ophthalmic ointment  . Codeine Nausea And Vomiting  . Iodinated Diagnostic Agents   . Iodine Hives    Contrast dye    Review of Systems  Constitutional: Negative for fever and malaise/fatigue.  HENT: Negative for congestion.   Eyes: Negative for blurred vision.  Respiratory: Negative for cough and shortness of breath.   Cardiovascular: Negative for chest pain, palpitations and leg swelling.  Gastrointestinal: Negative for vomiting.  Musculoskeletal: Positive for back pain, joint pain, myalgias and neck pain.  Skin: Negative for rash.  Neurological: Negative for loss of consciousness and headaches.       Objective:    Physical Exam  Constitutional: She is oriented to person, place, and time. She appears well-developed and well-nourished. No distress.  HENT:  Head: Normocephalic and atraumatic.  Eyes: Conjunctivae are normal.  Neck: Normal range of motion. No thyromegaly present.  Cardiovascular: Normal rate and regular rhythm.   Murmur heard. Pulmonary/Chest: Effort normal and breath sounds normal. She has no wheezes.  Abdominal: Soft. Bowel sounds are normal. There is no tenderness.  Musculoskeletal: Normal range of motion. She exhibits no edema or deformity.  Lymphadenopathy:    She has no cervical adenopathy.  Neurological: She is alert and oriented to person, place, and time.  Skin: Skin is warm and dry. She is not diaphoretic.  Psychiatric: She has a normal mood and affect.    BP 118/60 (BP Location: Left Arm, Patient Position: Sitting, Cuff Size: Normal)   Pulse 60   Temp 98.1 F (36.7 C) (Oral)   Resp 18   Ht 5' 2.5" (1.588 m)   Wt 115 lb 12.8 oz (52.5 kg)   SpO2 98%   BMI 20.84 kg/m  Wt Readings from Last 3 Encounters:  11/17/16 115 lb 12.8 oz (52.5 kg)  10/09/16 112 lb (50.8 kg)  01/09/16 114 lb 6 oz (51.9 kg)     Lab Results  Component Value Date   WBC 9.8 11/17/2016    HGB 12.9 11/17/2016   HCT 38.7 11/17/2016   PLT 329.0 11/17/2016   GLUCOSE 83 11/17/2016   CHOL 257 (H) 11/17/2016   TRIG 109.0 11/17/2016   HDL 118.20 11/17/2016   LDLCALC 117 (H) 11/17/2016   ALT 15 11/17/2016   AST 22 11/17/2016   NA 132 (L) 11/17/2016   K 4.6 11/17/2016   CL 100 11/17/2016   CREATININE 0.98 11/17/2016   BUN 18 11/17/2016   CO2 26 11/17/2016   TSH 5.22 (H)  11/17/2016   INR 0.94 09/22/2014   HGBA1C 5.5 11/17/2016    Lab Results  Component Value Date   TSH 5.22 (H) 11/17/2016   Lab Results  Component Value Date   WBC 9.8 11/17/2016   HGB 12.9 11/17/2016   HCT 38.7 11/17/2016   MCV 99.1 11/17/2016   PLT 329.0 11/17/2016   Lab Results  Component Value Date   NA 132 (L) 11/17/2016   K 4.6 11/17/2016   CO2 26 11/17/2016   GLUCOSE 83 11/17/2016   BUN 18 11/17/2016   CREATININE 0.98 11/17/2016   BILITOT 0.4 11/17/2016   ALKPHOS 48 11/17/2016   AST 22 11/17/2016   ALT 15 11/17/2016   PROT 8.0 11/17/2016   ALBUMIN 4.6 11/17/2016   CALCIUM 9.7 11/17/2016   ANIONGAP 8 09/22/2014   GFR 57.51 (L) 11/17/2016   Lab Results  Component Value Date   CHOL 257 (H) 11/17/2016   Lab Results  Component Value Date   HDL 118.20 11/17/2016   Lab Results  Component Value Date   LDLCALC 117 (H) 11/17/2016   Lab Results  Component Value Date   TRIG 109.0 11/17/2016   Lab Results  Component Value Date   CHOLHDL 2 11/17/2016   Lab Results  Component Value Date   HGBA1C 5.5 11/17/2016       Assessment & Plan:   Problem List Items Addressed This Visit    GERD (gastroesophageal reflux disease)    Avoid offending foods, start probiotics. Do not eat large meals in late evening and consider raising head of bed.       Relevant Medications   MAGNESIUM CITRATE PO   famotidine (PEPCID AC) 10 MG chewable tablet   Thyroid disease    On Levothyroxine, continue to monitor      Relevant Medications   levothyroxine (SYNTHROID, LEVOTHROID) 25 MCG  tablet   Other Relevant Orders   TSH (Completed)   Arthritis    Consider topical gels from [place], Aspercreme or [facility] with Lidocaine or NSAIDs. Worst in right hip, right shoulders and neck, encouraged to continue exercise      Lupus (systemic lupus erythematosus) ([place])    Follows with Dr Charlestine Night, doing well, tolerating regular exercise using her continuing fitness chair.       Osteoporosis    Encouraged to get adequate exercise, calcium and vitamin d intake, tolerating Prolia after 2 shots      Relevant Medications   Cholecalciferol (VITAMIN D) 2000 units CAPS   Hyperglycemia    minimize simple carbs. Increase exercise as tolerated.       Relevant Orders   Hemoglobin A1c (Completed)   US Aorta   ECHOCARDIOGRAM COMPLETE   Renal insufficiency    Check cmp      Relevant Orders   Comprehensive metabolic panel (Completed)   CBC (Completed)   Anemia    Increase leafy greens, consider increased lean red meat and using cast iron cookware. Continue to monitor, report any concerns      Relevant Medications   Cyanocobalamin (VITAMIN B 12 PO)   Other Relevant Orders   Comprehensive metabolic panel (Completed)   CBC (Completed)   Vitamin D deficiency - Primary    Continue daily vitamin D and check level      Relevant Orders   VITAMIN D 25 Hydroxy (Vit-D Deficiency, Fractures) (Completed)   Skin lesion of face    New lesion on left nares, has appt with her dermatologist, Dr Renda Rolls this week.  Vitamin B12 deficiency    Takes daily Vitamin B12 1000 mcg check level today      Relevant Orders   Vitamin B12 (Completed)   Hyperlipidemia    Encouraged heart healthy diet, increase exercise, avoid trans fats, consider a krill oil cap daily      Relevant Orders   Lipid panel (Completed)   US Aorta   ECHOCARDIOGRAM COMPLETE   Atherosclerosis of aorta (HCC)    Check aorta ultrasound and echo. Result dicovered on cxr       Relevant Orders   US Aorta    ECHOCARDIOGRAM COMPLETE   Palpitations    With a new heart murmur, proceed with echocardiogram.       Relevant Orders   ECHOCARDIOGRAM COMPLETE   Retinal tear of right eye    Years ago, now follows with Dr Bing Plume      Macular degeneration    In right eye, mild did not tolerate med recommended by opthamology, is following with Dr Bing Plume      Preventative health care    Patient encouraged to maintain heart healthy diet, regular exercise, adequate sleep. Consider daily probiotics. Take medications as prescribed. Labs reviewed.        Other Visit Diagnoses    H/O tobacco use, presenting hazards to health       Relevant Orders   US Aorta      I have discontinued Ms. Champney's Naproxen Sodium, loratadine, and omeprazole. I am also having her maintain her acetaminophen, Probiotic Product (PROBIOTIC DAILY PO), Triamcinolone Acetonide (NASACORT AQ NA), diphenhydrAMINE, Vitamin D, Cyanocobalamin (VITAMIN B 12 PO), MAGNESIUM CITRATE PO, famotidine, and levothyroxine.  Meds ordered this encounter  Medications  . Cholecalciferol (VITAMIN D) 2000 units CAPS    Sig: Take 2,000 Units by mouth daily.  . Cyanocobalamin (VITAMIN B 12 PO)    Sig: Take 1,000 mg by mouth daily.  Marland Kitchen MAGNESIUM CITRATE PO    Sig: Take 250 mg by mouth daily.  . famotidine (PEPCID AC) 10 MG chewable tablet    Sig: Chew 10 mg by mouth daily as needed for heartburn.  Marland Kitchen DISCONTD: levothyroxine (SYNTHROID, LEVOTHROID) 25 MCG tablet    Sig: Take 25 mcg by mouth daily.  Marland Kitchen levothyroxine (SYNTHROID, LEVOTHROID) 25 MCG tablet    Sig: Take 1 tablet (25 mcg total) by mouth daily.    Dispense:  90 tablet    Refill:  1    CMA served as Education administrator during this visit. History, Physical and Plan performed by medical provider. Documentation and orders reviewed and attested to.  Penni Homans, MD

## 2016-11-17 NOTE — Assessment & Plan Note (Addendum)
Check aorta ultrasound and echo. Result dicovered on cxr

## 2016-11-17 NOTE — Assessment & Plan Note (Addendum)
Years ago, now follows with Dr Bing Plume

## 2016-11-17 NOTE — Assessment & Plan Note (Signed)
Encouraged heart healthy diet, increase exercise, avoid trans fats, consider a krill oil cap daily 

## 2016-11-17 NOTE — Assessment & Plan Note (Signed)
Continue daily vitamin D and check level

## 2016-11-17 NOTE — Assessment & Plan Note (Signed)
Consider topical gels from [facility], Aspercreme or [facility] with Lidocaine or NSAIDs. Worst in right hip, right shoulders and neck, encouraged to continue exercise

## 2016-11-17 NOTE — Assessment & Plan Note (Addendum)
Encouraged to get adequate exercise, calcium and vitamin d intake, tolerating Prolia after 2 shots

## 2016-11-17 NOTE — Assessment & Plan Note (Signed)
On Levothyroxine, continue to monitor 

## 2016-11-17 NOTE — Assessment & Plan Note (Signed)
minimize simple carbs. Increase exercise as tolerated.  

## 2016-11-17 NOTE — Assessment & Plan Note (Signed)
In right eye, mild did not tolerate med recommended by opthamology, is following with Dr Bing Plume

## 2016-11-17 NOTE — Progress Notes (Signed)
Pre visit review using our clinic review tool, if applicable. No additional management support is needed unless otherwise documented below in the visit note. 

## 2016-11-17 NOTE — Assessment & Plan Note (Signed)
Takes daily Vitamin B12 1000 mcg check level today

## 2016-11-17 NOTE — Assessment & Plan Note (Signed)
Increase leafy greens, consider increased lean red meat and using cast iron cookware. Continue to monitor, report any concerns 

## 2016-11-17 NOTE — Patient Instructions (Signed)
Molly Williams a copy of your Advanced Directives  Preventive Care 65 Years and Older, Female Preventive care refers to lifestyle choices and visits with your health care provider that can promote health and wellness. What does preventive care include?  A yearly physical exam. This is also called an annual well check.  Dental exams once or twice a year.  Routine eye exams. Ask your health care provider how often you should have your eyes checked.  Personal lifestyle choices, including:  Daily care of your teeth and gums.  Regular physical activity.  Eating a healthy diet.  Avoiding tobacco and drug use.  Limiting alcohol use.  Practicing safe sex.  Taking low-dose aspirin every day.  Taking vitamin and mineral supplements as recommended by your health care provider. What happens during an annual well check? The services and screenings done by your health care provider during your annual well check will depend on your age, overall health, lifestyle risk factors, and family history of disease. Counseling  Your health care provider may ask you questions about your:  Alcohol use.  Tobacco use.  Drug use.  Emotional well-being.  Home and relationship well-being.  Sexual activity.  Eating habits.  History of falls.  Memory and ability to understand (cognition).  Work and work Statistician.  Reproductive health. Screening  You may have the following tests or measurements:  Height, weight, and BMI.  Blood pressure.  Lipid and cholesterol levels. These may be checked every 5 years, or more frequently if you are over 31 years old.  Skin check.  Lung cancer screening. You may have this screening every year starting at age 79 if you have a 30-pack-year history of smoking and currently smoke or have quit within the past 15 years.  Fecal occult blood test (FOBT) of the stool. You may have this test every year starting at age 23.  Flexible sigmoidoscopy or  colonoscopy. You may have a sigmoidoscopy every 5 years or a colonoscopy every 10 years starting at age 60.  Hepatitis C blood test.  Hepatitis B blood test.  Sexually transmitted disease (STD) testing.  Diabetes screening. This is done by checking your blood sugar (glucose) after you have not eaten for a while (fasting). You may have this done every 1-3 years.  Bone density scan. This is done to screen for osteoporosis. You may have this done starting at age 52.  Mammogram. This may be done every 1-2 years. Talk to your health care provider about how often you should have regular mammograms. Talk with your health care provider about your test results, treatment options, and if necessary, the need for more tests. Vaccines  Your health care provider may recommend certain vaccines, such as:  Influenza vaccine. This is recommended every year.  Tetanus, diphtheria, and acellular pertussis (Tdap, Td) vaccine. You may need a Td booster every 10 years.  Varicella vaccine. You may need this if you have not been vaccinated.  Zoster vaccine. You may need this after age 63.  Measles, mumps, and rubella (MMR) vaccine. You may need at least one dose of MMR if you were born in 1957 or later. You may also need a second dose.  Pneumococcal 13-valent conjugate (PCV13) vaccine. One dose is recommended after age 60.  Pneumococcal polysaccharide (PPSV23) vaccine. One dose is recommended after age 81.  Meningococcal vaccine. You may need this if you have certain conditions.  Hepatitis A vaccine. You may need this if you have certain conditions or if you travel or  work in places where you may be exposed to hepatitis A.  Hepatitis B vaccine. You may need this if you have certain conditions or if you travel or work in places where you may be exposed to hepatitis B.  Haemophilus influenzae type b (Hib) vaccine. You may need this if you have certain conditions. Talk to your health care provider about  which screenings and vaccines you need and how often you need them. This information is not intended to replace advice given to you by your health care provider. Make sure you discuss any questions you have with your health care provider. Document Released: 09/27/2015 Document Revised: 05/20/2016 Document Reviewed: 07/02/2015 Elsevier Interactive Patient Education  2017 [employer].

## 2016-11-18 ENCOUNTER — Encounter: Payer: Self-pay | Admitting: Family Medicine

## 2016-11-18 DIAGNOSIS — C44319 Basal cell carcinoma of skin of other parts of face: Secondary | ICD-10-CM | POA: Diagnosis not present

## 2016-11-18 DIAGNOSIS — D485 Neoplasm of uncertain behavior of skin: Secondary | ICD-10-CM | POA: Diagnosis not present

## 2016-11-18 DIAGNOSIS — Z0000 Encounter for general adult medical examination without abnormal findings: Secondary | ICD-10-CM

## 2016-11-18 HISTORY — DX: Encounter for general adult medical examination without abnormal findings: Z00.00

## 2016-11-18 LAB — COMPREHENSIVE METABOLIC PANEL
ALT: 15 U/L (ref 0–35)
AST: 22 U/L (ref 0–37)
Albumin: 4.6 g/dL (ref 3.5–5.2)
Alkaline Phosphatase: 48 U/L (ref 39–117)
BUN: 18 mg/dL (ref 6–23)
CALCIUM: 9.7 mg/dL (ref 8.4–10.5)
CHLORIDE: 100 meq/L (ref 96–112)
CO2: 26 meq/L (ref 19–32)
CREATININE: 0.98 mg/dL (ref 0.40–1.20)
GFR: 57.51 mL/min — AB (ref >60.00)
Glucose, Bld: 83 mg/dL (ref 70–99)
POTASSIUM: 4.6 meq/L (ref 3.5–5.1)
Sodium: 132 mEq/L — ABNORMAL LOW (ref 135–145)
Total Bilirubin: 0.4 mg/dL (ref 0.2–1.2)
Total Protein: 8 g/dL (ref 6.0–8.3)

## 2016-11-18 LAB — CBC
HEMATOCRIT: 38.7 % (ref 36.0–46.0)
HEMOGLOBIN: 12.9 g/dL (ref 12.0–15.0)
MCHC: 33.4 g/dL (ref 30.0–36.0)
MCV: 99.1 fl (ref 78.0–100.0)
Platelets: 329 10*3/uL (ref 150.0–400.0)
RBC: 3.9 Mil/uL (ref 3.87–5.11)
RDW: 14 % (ref 11.5–15.5)
WBC: 9.8 10*3/uL (ref 4.0–10.5)

## 2016-11-18 LAB — LIPID PANEL
CHOL/HDL RATIO: 2
Cholesterol: 257 mg/dL — ABNORMAL HIGH (ref 0–200)
HDL: 118.2 mg/dL (ref >39.00)
LDL CALC: 117 mg/dL — AB (ref 0–99)
NonHDL: 138.99
TRIGLYCERIDES: 109 mg/dL (ref 0.0–149.0)
VLDL: 21.8 mg/dL (ref 0.0–40.0)

## 2016-11-18 LAB — HEMOGLOBIN A1C: Hgb A1c MFr Bld: 5.5 % (ref 4.6–6.5)

## 2016-11-18 LAB — TSH: TSH: 5.22 u[IU]/mL — ABNORMAL HIGH (ref 0.35–4.50)

## 2016-11-18 LAB — VITAMIN B12: VITAMIN B 12: 500 pg/mL (ref 211–911)

## 2016-11-18 LAB — VITAMIN D 25 HYDROXY (VIT D DEFICIENCY, FRACTURES): VITD: 35.45 ng/mL (ref 30.00–100.00)

## 2016-11-18 NOTE — Assessment & Plan Note (Signed)
With a new heart murmur, proceed with echocardiogram.

## 2016-11-18 NOTE — Assessment & Plan Note (Signed)
Follows with Dr Charlestine Night, doing well, tolerating regular exercise using her continuing fitness chair.

## 2016-11-18 NOTE — Assessment & Plan Note (Signed)
Patient encouraged to maintain heart healthy diet, regular exercise, adequate sleep. Consider daily probiotics. Take medications as prescribed. Labs reviewed 

## 2016-11-18 NOTE — Assessment & Plan Note (Signed)
New lesion on left nares, has appt with her dermatologist, Dr Renda Rolls this week.

## 2016-11-19 ENCOUNTER — Other Ambulatory Visit: Payer: Self-pay | Admitting: Family Medicine

## 2016-11-19 DIAGNOSIS — E871 Hypo-osmolality and hyponatremia: Secondary | ICD-10-CM

## 2016-11-22 ENCOUNTER — Encounter: Payer: Self-pay | Admitting: Family Medicine

## 2016-11-25 ENCOUNTER — Telehealth: Payer: Self-pay | Admitting: Family Medicine

## 2016-11-25 NOTE — Telephone Encounter (Signed)
Pt dropped off copies of Advanced Directives and Power of Attorney for provider to see and have copies on file for pt. Document put at front office Tray.

## 2016-12-03 ENCOUNTER — Other Ambulatory Visit (INDEPENDENT_AMBULATORY_CARE_PROVIDER_SITE_OTHER): Payer: PPO

## 2016-12-03 DIAGNOSIS — E871 Hypo-osmolality and hyponatremia: Secondary | ICD-10-CM | POA: Diagnosis not present

## 2016-12-03 LAB — COMPREHENSIVE METABOLIC PANEL
ALBUMIN: 4.3 g/dL (ref 3.5–5.2)
ALT: 15 U/L (ref 0–35)
AST: 19 U/L (ref 0–37)
Alkaline Phosphatase: 42 U/L (ref 39–117)
BUN: 14 mg/dL (ref 6–23)
CHLORIDE: 103 meq/L (ref 96–112)
CO2: 27 meq/L (ref 19–32)
Calcium: 9.1 mg/dL (ref 8.4–10.5)
Creatinine, Ser: 0.89 mg/dL (ref 0.40–1.20)
GFR: 64.26 mL/min (ref >60.00)
GLUCOSE: 93 mg/dL (ref 70–99)
Potassium: 4.3 mEq/L (ref 3.5–5.1)
Sodium: 134 mEq/L — ABNORMAL LOW (ref 135–145)
Total Bilirubin: 0.4 mg/dL (ref 0.2–1.2)
Total Protein: 7.5 g/dL (ref 6.0–8.3)

## 2016-12-22 ENCOUNTER — Other Ambulatory Visit: Payer: Self-pay | Admitting: Family Medicine

## 2016-12-23 ENCOUNTER — Ambulatory Visit (HOSPITAL_BASED_OUTPATIENT_CLINIC_OR_DEPARTMENT_OTHER)
Admission: RE | Admit: 2016-12-23 | Discharge: 2016-12-23 | Disposition: A | Discharge status: Home / Self Care | Payer: PPO | Source: Ambulatory Visit | Attending: Family Medicine | Admitting: Family Medicine

## 2016-12-23 DIAGNOSIS — R739 Hyperglycemia, unspecified: Secondary | ICD-10-CM

## 2016-12-23 DIAGNOSIS — I700 Atherosclerosis of aorta: Secondary | ICD-10-CM | POA: Insufficient documentation

## 2016-12-23 DIAGNOSIS — Z136 Encounter for screening for cardiovascular disorders: Secondary | ICD-10-CM | POA: Diagnosis not present

## 2016-12-23 DIAGNOSIS — E785 Hyperlipidemia, unspecified: Secondary | ICD-10-CM | POA: Diagnosis not present

## 2016-12-23 DIAGNOSIS — R002 Palpitations: Secondary | ICD-10-CM | POA: Insufficient documentation

## 2016-12-23 DIAGNOSIS — Z87891 Personal history of nicotine dependence: Secondary | ICD-10-CM | POA: Insufficient documentation

## 2016-12-23 DIAGNOSIS — I351 Nonrheumatic aortic (valve) insufficiency: Secondary | ICD-10-CM | POA: Diagnosis not present

## 2016-12-23 NOTE — Progress Notes (Signed)
  Echocardiogram 2D Echocardiogram has been performed.  Tresa Res 12/23/2016, 3:47 PM

## 2017-01-08 DIAGNOSIS — C44311 Basal cell carcinoma of skin of nose: Secondary | ICD-10-CM | POA: Diagnosis not present

## 2017-01-20 DIAGNOSIS — H26491 Other secondary cataract, right eye: Secondary | ICD-10-CM | POA: Diagnosis not present

## 2017-01-20 DIAGNOSIS — H524 Presbyopia: Secondary | ICD-10-CM | POA: Diagnosis not present

## 2017-01-20 DIAGNOSIS — H5213 Myopia, bilateral: Secondary | ICD-10-CM | POA: Diagnosis not present

## 2017-01-20 DIAGNOSIS — H52223 Regular astigmatism, bilateral: Secondary | ICD-10-CM | POA: Diagnosis not present

## 2017-01-20 DIAGNOSIS — H353132 Nonexudative age-related macular degeneration, bilateral, intermediate dry stage: Secondary | ICD-10-CM | POA: Diagnosis not present

## 2017-02-11 DIAGNOSIS — K1370 Unspecified lesions of oral mucosa: Secondary | ICD-10-CM | POA: Diagnosis not present

## 2017-03-19 ENCOUNTER — Encounter: Payer: Self-pay | Admitting: Family Medicine

## 2017-03-19 ENCOUNTER — Other Ambulatory Visit: Payer: Self-pay | Admitting: Family Medicine

## 2017-03-19 DIAGNOSIS — E079 Disorder of thyroid, unspecified: Secondary | ICD-10-CM

## 2017-03-22 ENCOUNTER — Other Ambulatory Visit (INDEPENDENT_AMBULATORY_CARE_PROVIDER_SITE_OTHER): Payer: PPO

## 2017-03-22 DIAGNOSIS — E079 Disorder of thyroid, unspecified: Secondary | ICD-10-CM | POA: Diagnosis not present

## 2017-03-22 LAB — TSH: TSH: 1.86 u[IU]/mL (ref 0.35–4.50)

## 2017-03-29 DIAGNOSIS — L309 Dermatitis, unspecified: Secondary | ICD-10-CM | POA: Diagnosis not present

## 2017-03-29 DIAGNOSIS — L570 Actinic keratosis: Secondary | ICD-10-CM | POA: Diagnosis not present

## 2017-03-29 DIAGNOSIS — R202 Paresthesia of skin: Secondary | ICD-10-CM | POA: Diagnosis not present

## 2017-03-29 DIAGNOSIS — L905 Scar conditions and fibrosis of skin: Secondary | ICD-10-CM | POA: Diagnosis not present

## 2017-04-01 ENCOUNTER — Telehealth: Payer: Self-pay | Admitting: Family Medicine

## 2017-04-01 NOTE — Telephone Encounter (Signed)
Prolia benefits verified No PA required 20% co-insurance for Admin and Prolia $20 copay is OV billed  Patient may owe approximately $220 OOP  Patient due after 04/21/17

## 2017-04-02 NOTE — Telephone Encounter (Signed)
Called the patient informed of response. Last cmp 12/03/2016 Last Bone density 09/17/2015 She is waiting on hearing back on the foundation that helps her with her prolia before ordering.

## 2017-04-04 ENCOUNTER — Encounter: Payer: Self-pay | Admitting: Family Medicine

## 2017-04-05 ENCOUNTER — Telehealth: Payer: Self-pay | Admitting: Medical

## 2017-04-05 NOTE — Telephone Encounter (Signed)
Pt sent my chart message. I responded to her and asked her to make appointment on wed with me early am or early afternoon. Do you have access to my chart chart response. Will you call her and advise the same.

## 2017-04-05 NOTE — Telephone Encounter (Signed)
Please advise 

## 2017-04-06 ENCOUNTER — Telehealth: Payer: Self-pay | Admitting: Medical

## 2017-04-06 ENCOUNTER — Encounter: Payer: Self-pay | Admitting: Medical

## 2017-04-06 DIAGNOSIS — M545 Low back pain: Principal | ICD-10-CM

## 2017-04-06 DIAGNOSIS — G8929 Other chronic pain: Secondary | ICD-10-CM

## 2017-04-06 NOTE — Telephone Encounter (Signed)
Spoke with pt.Pt does not want to come in the office. Pt states she just wants to go to the spine specialist.

## 2017-04-06 NOTE — Telephone Encounter (Signed)
See referral to spine specialist.

## 2017-04-06 NOTE — Telephone Encounter (Signed)
Referral faxed to [facility], awaiting appt

## 2017-04-09 ENCOUNTER — Encounter: Payer: Self-pay | Admitting: Family Medicine

## 2017-04-09 NOTE — Telephone Encounter (Signed)
Pt called in, she would like to speak with CMA that called.

## 2017-04-09 NOTE — Telephone Encounter (Signed)
Pt called in provided pt with the phone # of referred location

## 2017-04-09 NOTE — Telephone Encounter (Signed)
his is not a question and does not need a reply -- when I talked with you by phone today about my next Prolia shot due in August, we decided not to order the Prolia quite  yet because I was having a problem receiving payment for the last shot given on 10/23/16.  In talking with my concierge with [employer] she was able to straighten this out and she found that payment was sent out on 04/07/17, and I now feel confident about ordering the Prolia for the next shot.  Thank you very much.  Above is a Pharmacist, community message from the patient. So based on this message have contacted [facility] and ordered her prolia. Informed the patient to schedule her injection after the 8th of August 2018.

## 2017-04-09 NOTE — Telephone Encounter (Signed)
Returned her call and she is still uncertain of the cost. Will hold off for now and try to get her last prolia shot cost taken care of first before getting another at this time.

## 2017-04-15 DIAGNOSIS — M48062 Spinal stenosis, lumbar region with neurogenic claudication: Secondary | ICD-10-CM | POA: Diagnosis not present

## 2017-04-15 DIAGNOSIS — M5416 Radiculopathy, lumbar region: Secondary | ICD-10-CM | POA: Diagnosis not present

## 2017-04-15 DIAGNOSIS — R030 Elevated blood-pressure reading, without diagnosis of hypertension: Secondary | ICD-10-CM | POA: Diagnosis not present

## 2017-04-22 DIAGNOSIS — M545 Low back pain: Secondary | ICD-10-CM | POA: Diagnosis not present

## 2017-04-22 DIAGNOSIS — M48062 Spinal stenosis, lumbar region with neurogenic claudication: Secondary | ICD-10-CM | POA: Diagnosis not present

## 2017-04-29 DIAGNOSIS — R030 Elevated blood-pressure reading, without diagnosis of hypertension: Secondary | ICD-10-CM | POA: Diagnosis not present

## 2017-04-29 DIAGNOSIS — M4317 Spondylolisthesis, lumbosacral region: Secondary | ICD-10-CM | POA: Diagnosis not present

## 2017-04-29 DIAGNOSIS — M5416 Radiculopathy, lumbar region: Secondary | ICD-10-CM | POA: Diagnosis not present

## 2017-04-29 DIAGNOSIS — M4727 Other spondylosis with radiculopathy, lumbosacral region: Secondary | ICD-10-CM | POA: Diagnosis not present

## 2017-05-03 ENCOUNTER — Telehealth: Payer: Self-pay | Admitting: Family Medicine

## 2017-05-03 NOTE — Telephone Encounter (Signed)
Yes medication is here. You can go ahead and schedule patient for the injection.     PC

## 2017-05-03 NOTE — Telephone Encounter (Signed)
Pt sent a mychart message to get her second prolia vac. Is this okay to schedule?

## 2017-05-04 ENCOUNTER — Ambulatory Visit (INDEPENDENT_AMBULATORY_CARE_PROVIDER_SITE_OTHER): Payer: PPO

## 2017-05-04 DIAGNOSIS — M810 Age-related osteoporosis without current pathological fracture: Secondary | ICD-10-CM | POA: Diagnosis not present

## 2017-05-04 MED ORDER — DENOSUMAB 60 MG/ML ~~LOC~~ SOLN
60.0000 mg | Freq: Once | SUBCUTANEOUS | Status: AC
  Administered 2017-05-04: 60 mg via SUBCUTANEOUS

## 2017-05-04 NOTE — Progress Notes (Signed)
Pre visit review using our clinic tool,if applicable. No additional management support is needed unless otherwise documented below in the visit note.   Patient in for Prolia injection per order from Dr. Charlett Blake due to Diagnosis of  Osteoporosis.   Given 60 mg IM left SQ. Patient tolerated well. Given reminder card for next injection and advised she would be called to schedule appointment.

## 2017-05-04 NOTE — Telephone Encounter (Signed)
Pt has been scheduled.  °

## 2017-05-15 DIAGNOSIS — N390 Urinary tract infection, site not specified: Secondary | ICD-10-CM | POA: Diagnosis not present

## 2017-05-26 DIAGNOSIS — Z881 Allergy status to other antibiotic agents status: Secondary | ICD-10-CM | POA: Diagnosis not present

## 2017-06-14 ENCOUNTER — Encounter: Payer: Self-pay | Admitting: Family Medicine

## 2017-06-15 MED ORDER — LEVOTHYROXINE SODIUM 25 MCG PO TABS: ORAL_TABLET | ORAL | 1 refills | Status: DC

## 2017-06-15 NOTE — Telephone Encounter (Signed)
If she oes not find the regimen too confusing her TSH was very good on the current dosing so send in a 90 supply with 1 rf. thanks

## 2017-06-29 ENCOUNTER — Ambulatory Visit (INDEPENDENT_AMBULATORY_CARE_PROVIDER_SITE_OTHER): Payer: PPO | Admitting: Family Medicine

## 2017-06-29 VITALS — BP 120/46 | HR 60 | Temp 98.1°F | Resp 18 | Wt 113.5 lb

## 2017-06-29 DIAGNOSIS — L989 Disorder of the skin and subcutaneous tissue, unspecified: Secondary | ICD-10-CM

## 2017-06-29 DIAGNOSIS — N390 Urinary tract infection, site not specified: Secondary | ICD-10-CM

## 2017-06-29 DIAGNOSIS — E559 Vitamin D deficiency, unspecified: Secondary | ICD-10-CM | POA: Diagnosis not present

## 2017-06-29 DIAGNOSIS — R739 Hyperglycemia, unspecified: Secondary | ICD-10-CM | POA: Diagnosis not present

## 2017-06-29 DIAGNOSIS — E782 Mixed hyperlipidemia: Secondary | ICD-10-CM

## 2017-06-29 DIAGNOSIS — M329 Systemic lupus erythematosus, unspecified: Secondary | ICD-10-CM

## 2017-06-29 DIAGNOSIS — E538 Deficiency of other specified B group vitamins: Secondary | ICD-10-CM

## 2017-06-29 DIAGNOSIS — A499 Bacterial infection, unspecified: Secondary | ICD-10-CM

## 2017-06-29 DIAGNOSIS — E079 Disorder of thyroid, unspecified: Secondary | ICD-10-CM

## 2017-06-29 DIAGNOSIS — Z23 Encounter for immunization: Secondary | ICD-10-CM | POA: Diagnosis not present

## 2017-06-29 DIAGNOSIS — D649 Anemia, unspecified: Secondary | ICD-10-CM | POA: Diagnosis not present

## 2017-06-29 NOTE — Patient Instructions (Addendum)
Lidocaine gel Tetanus shot is due in 2019 but if you get injured prior to that then should boost.  Consider the new shingles shot which is called Shingrix  2 shots over 2-6 months. Can get a pharmacy  Cholesterol Cholesterol is a white, waxy, fat-like substance that is needed by the human body in small amounts. The liver makes all the cholesterol we need. Cholesterol is carried from the liver by the blood through the blood vessels. Deposits of cholesterol (plaques) may build up on blood vessel (artery) walls. Plaques make the arteries narrower and stiffer. Cholesterol plaques increase the risk for heart attack and stroke. You cannot feel your cholesterol level even if it is very high. The only way to know that it is high is to have a blood test. Once you know your cholesterol levels, you should keep a record of the test results. Work with your health care provider to keep your levels in the desired range. What do the results mean?  Total cholesterol is a rough measure of all the cholesterol in your blood.  LDL (low-density lipoprotein) is the "bad" cholesterol. This is the type that causes plaque to build up on the artery walls. You want this level to be low.  HDL (high-density lipoprotein) is the "good" cholesterol because it cleans the arteries and carries the LDL away. You want this level to be high.  Triglycerides are fat that the body can either burn for energy or store. High levels are closely linked to heart disease. What are the desired levels of cholesterol?  Total cholesterol below 200.  LDL below 100 for people who are at risk, below 70 for people at very high risk.  HDL above 40 is good. A level of 60 or higher is considered to be protective against heart disease.  Triglycerides below 150. How can I lower my cholesterol? Diet Follow your diet program as told by your health care provider.  Choose fish or white meat chicken and [place], roasted or baked. Limit fatty cuts of red  meat, fried foods, and processed meats, such as sausage and lunch meats.  Eat lots of fresh fruits and vegetables.  Choose whole grains, beans, pasta, potatoes, and cereals.  Choose olive oil, corn oil, or canola oil, and use only small amounts.  Avoid butter, mayonnaise, shortening, or palm kernel oils.  Avoid foods with trans fats.  Drink skim or nonfat milk and eat low-fat or nonfat yogurt and cheeses. Avoid whole milk, cream, ice cream, egg yolks, and full-fat cheeses.  Healthier desserts include angel food cake, ginger snaps, animal crackers, hard candy, popsicles, and low-fat or nonfat frozen yogurt. Avoid pastries, cakes, pies, and cookies.  Exercise  Follow your exercise program as told by your health care provider. A regular program: ? Helps to decrease LDL and raise HDL. ? Helps with weight control.  Do things that increase your activity level, such as gardening, walking, and taking the stairs.  Ask your health care provider about ways that you can be more active in your daily life.  Medicine  Take over-the-counter and prescription medicines only as told by your health care provider. ? Medicine may be prescribed by your health care provider to help lower cholesterol and decrease the risk for heart disease. This is usually done if diet and exercise have failed to bring down cholesterol levels. ? If you have several risk factors, you may need medicine even if your levels are normal.  This information is not intended to replace advice  given to you by your health care provider. Make sure you discuss any questions you have with your health care provider. Document Released: 05/26/2001 Document Revised: 03/28/2016 Document Reviewed: 02/29/2016 Elsevier Interactive Patient Education  2017 [employer].  can help the hip pain, salon pas, aspercreme, icy hot

## 2017-06-29 NOTE — Assessment & Plan Note (Signed)
hgba1c acceptable, minimize simple carbs. Increase exercise as tolerated.  

## 2017-06-29 NOTE — Assessment & Plan Note (Signed)
Check level daily

## 2017-06-29 NOTE — Assessment & Plan Note (Signed)
Check CBC today.  

## 2017-06-29 NOTE — Assessment & Plan Note (Signed)
Encouraged heart healthy diet, increase exercise, avoid trans fats, consider a krill oil cap daily 

## 2017-06-29 NOTE — Assessment & Plan Note (Signed)
Has had some increase in flares of pain lately would like to return to a rheumatologist for consideration

## 2017-06-29 NOTE — Assessment & Plan Note (Signed)
Went to urgent care on 05/15/17 for UTI, took Bactrim for 8 days then had hives so stopped it, took a course of steroids and rash resolved. No urinary symptoms presently

## 2017-06-29 NOTE — Assessment & Plan Note (Signed)
BCC removed from left side of nose. Has an irregular lesion on skin.she is going to call the Mohs surgeon about follow up

## 2017-06-29 NOTE — Progress Notes (Signed)
Subjective:  I acted as a Education administrator for Dr. Charlett Blake. [place], [place]  Patient ID: Molly Williams, female    DOB: 1930-10-22, 83 y.o.   MRN: [id number]  No chief complaint on file.   HPI  Patient is in today for a 6 month follow up. She has been mostly well but she does acknowledge that her joint pains and myalgias are worsening and she thinks she is ready to back and see rheumatology. No recent illness or hospitalizations. Denies CP/palp/SOB/HA/congestion/fevers/GI or GU c/o. Taking meds as prescribed Patient Care Team: Mosie Lukes, MD as PCP - General (Family Medicine) [place], Jennefer Bravo, MD as Referring Physician (Dermatology)   Past Medical History:  Diagnosis Date  . Anemia 03/29/2015  . Arthritis   . Atrophic vaginitis   . Cholelithiasis 2008  . Fibrocystic breast disease    bx 2008 neg per pt report  . GERD (gastroesophageal reflux disease)   . Hearing loss   . Hearing loss sensory, bilateral    bil/hearing aids  . Hematuria    chronic Dr. Burnell Blanks  . Hyperlipidemia 11/17/2016  . Lupus (systemic lupus erythematosus) ([facility])    Dr. Charlestine Night  . Macular degeneration 11/17/2016  . Migraine aura without headache    h/o ocular migraines  . Osteopenia   . Preventative health care 11/18/2016  . Renal insufficiency 03/29/2015  . Retinal detachment with break    right/laser  . Retinal tear of right eye 11/17/2016  . Skin lesion of face 01/09/2016  . Spinal stenosis   . Thyroid disease    hypothyroidism  . Urinary tract bacterial infections    has had urethral dilations in past  . Vitamin B12 deficiency 01/09/2016  . Vitamin D deficiency 07/11/2015    Past Surgical History:  Procedure Laterality Date  . APPENDECTOMY  2006  . CATARACT EXTRACTION W/ INTRAOCULAR LENS  IMPLANT, BILATERAL    . CHOLECYSTECTOMY    . EYE SURGERY Bilateral    cataracts  . TONSILLECTOMY  1940  . VAGINAL HYSTERECTOMY     partial    Family History  Problem Relation Age of Onset  . Stroke  Paternal Grandmother   . Stroke Father   . Coronary artery disease Father   . Coronary artery disease Mother   . Stroke Mother   . Cancer Paternal Aunt        GYN cancer  . Cancer Paternal Uncle        lung, smoker  . Stroke Maternal Grandmother   . Stroke Maternal Grandfather   . Heart disease Paternal Grandfather   . Hydrocephalus Brother        normopressure hydrocephalus, dementia    Social History   Social History  . Marital status: Widowed    Spouse name: N/A  . Number of children: N/A  . Years of education: N/A   Occupational History  . Not on file.   Social History Main Topics  . Smoking status: Former Research scientist (life sciences)  . Smokeless tobacco: Never Used  . Alcohol use 6.0 oz/week    10 Glasses of wine per week  . Drug use: No  . Sexual activity: No     Comment: lives alone here because her daughter lives hier, no dietary restrictions, avoids dairy   Other Topics Concern  . Not on file   Social History Narrative  . No narrative on file    Outpatient Medications Prior to Visit  Medication Sig Dispense Refill  . acetaminophen (TYLENOL) 500 MG tablet Take  1,000 mg by mouth every 6 (six) hours as needed. pain     . Cholecalciferol (VITAMIN D) 2000 units CAPS Take 2,000 Units by mouth daily.    . Cyanocobalamin (VITAMIN B 12 PO) Take 1,000 mg by mouth daily.    . diphenhydrAMINE (BENADRYL) 25 mg capsule Take 25 mg by mouth every 6 (six) hours as needed.    . famotidine (PEPCID AC) 10 MG chewable tablet Chew 10 mg by mouth daily as needed for heartburn.    . levothyroxine (SYNTHROID, LEVOTHROID) 25 MCG tablet Take 2 tablets on Tuesday and Saturday all other days take 1 tablet daily 180 tablet 1  . MAGNESIUM CITRATE PO Take 250 mg by mouth daily.    Marland Kitchen omeprazole (PRILOSEC) 20 MG capsule TAKE ONE CAPSULE BY MOUTH TWICE DAILY 60 capsule 2  . Probiotic Product (PROBIOTIC DAILY PO) Take by mouth.    . Triamcinolone Acetonide (NASACORT AQ NA) Place into the nose.     No  facility-administered medications prior to visit.     Allergies  Allergen Reactions  . Codeine Nausea And Vomiting and Nausea Only  . Iodine Hives    Contrast dye  . Alendronate   . Bacitracin Swelling    Ophthalmic ointment  . Iodinated Diagnostic Agents   . Bactrim  [Sulfamethoxazole-Trimethoprim] Hives, Itching and Rash    Review of Systems  Constitutional: Negative for fever and malaise/fatigue.  HENT: Negative for congestion.   Eyes: Negative for blurred vision.  Respiratory: Negative for shortness of breath.   Cardiovascular: Negative for chest pain, palpitations and leg swelling.  Gastrointestinal: Negative for abdominal pain, blood in stool and nausea.  Genitourinary: Negative for dysuria and frequency.  Musculoskeletal: Positive for back pain, joint pain and myalgias. Negative for falls.  Skin: Negative for rash.  Neurological: Negative for dizziness, loss of consciousness and headaches.  Endo/Heme/Allergies: Negative for environmental allergies.  Psychiatric/Behavioral: Negative for depression. The patient is not nervous/anxious.        Objective:    Physical Exam  Constitutional: She is oriented to person, place, and time. She appears well-developed and well-nourished. No distress.  HENT:  Head: Normocephalic and atraumatic.  Nose: Nose normal.  Eyes: Right eye exhibits no discharge. Left eye exhibits no discharge.  Neck: Normal range of motion. Neck supple.  Cardiovascular: Normal rate and regular rhythm.   No murmur heard. Pulmonary/Chest: Effort normal and breath sounds normal.  Abdominal: Soft. Bowel sounds are normal. There is no tenderness.  Musculoskeletal: She exhibits no edema.  Neurological: She is alert and oriented to person, place, and time.  Skin: Skin is warm and dry.  Psychiatric: She has a normal mood and affect.  Nursing note and vitals reviewed.   BP (!) 120/46 (BP Location: Left Arm, Patient Position: Sitting, Cuff Size: Normal)    Pulse 60   Temp 98.1 F (36.7 C) (Oral)   Resp 18   Wt 113 lb 8 oz (51.5 kg)   SpO2 96%   BMI 20.43 kg/m  Wt Readings from Last 3 Encounters:  06/29/17 113 lb 8 oz (51.5 kg)  11/17/16 115 lb 12.8 oz (52.5 kg)  10/09/16 112 lb (50.8 kg)   BP Readings from Last 3 Encounters:  06/29/17 (!) 120/46  11/17/16 118/60  10/09/16 119/59     Immunization History  Administered Date(s) Administered  . Influenza Split 07/10/2011, 07/05/2012  . Influenza Whole 07/15/2010  . Influenza, High Dose Seasonal PF 06/29/2016, 06/29/2017  . Influenza,inj,Quad PF,6+ Mos 06/27/2013, 07/04/2014, 07/11/2015  .  Pneumococcal Conjugate-13 07/14/2008  . Pneumococcal Polysaccharide-23 07/11/2015  . Td 09/15/2007  . Zoster 09/14/2006    Health Maintenance  Topic Date Due  . MAMMOGRAM  01/08/2017  . TETANUS/TDAP  09/14/2017  . INFLUENZA VACCINE  Completed  . DEXA SCAN  Completed  . PNA vac Low Risk Adult  Completed    Lab Results  Component Value Date   WBC 6.8 06/29/2017   HGB 12.6 06/29/2017   HCT 37.6 06/29/2017   PLT 341.0 06/29/2017   GLUCOSE 83 06/29/2017   CHOL 222 (H) 06/29/2017   TRIG 130.0 06/29/2017   HDL 93.60 06/29/2017   LDLCALC 102 (H) 06/29/2017   ALT 11 06/29/2017   AST 17 06/29/2017   NA 136 06/29/2017   K 4.4 06/29/2017   CL 104 06/29/2017   CREATININE 0.98 06/29/2017   BUN 16 06/29/2017   CO2 23 06/29/2017   TSH 2.94 06/29/2017   INR 0.94 09/22/2014   HGBA1C 5.4 06/29/2017    Lab Results  Component Value Date   TSH 2.94 06/29/2017   Lab Results  Component Value Date   WBC 6.8 06/29/2017   HGB 12.6 06/29/2017   HCT 37.6 06/29/2017   MCV 99.3 06/29/2017   PLT 341.0 06/29/2017   Lab Results  Component Value Date   NA 136 06/29/2017   K 4.4 06/29/2017   CO2 23 06/29/2017   GLUCOSE 83 06/29/2017   BUN 16 06/29/2017   CREATININE 0.98 06/29/2017   BILITOT 0.3 06/29/2017   ALKPHOS 37 (L) 06/29/2017   AST 17 06/29/2017   ALT 11 06/29/2017   PROT 7.1  06/29/2017   ALBUMIN 4.2 06/29/2017   CALCIUM 9.4 06/29/2017   ANIONGAP 8 09/22/2014   GFR 57.42 (L) 06/29/2017   Lab Results  Component Value Date   CHOL 222 (H) 06/29/2017   Lab Results  Component Value Date   HDL 93.60 06/29/2017   Lab Results  Component Value Date   LDLCALC 102 (H) 06/29/2017   Lab Results  Component Value Date   TRIG 130.0 06/29/2017   Lab Results  Component Value Date   CHOLHDL 2 06/29/2017   Lab Results  Component Value Date   HGBA1C 5.4 06/29/2017         Assessment & Plan:   Problem List Items Addressed This Visit    Thyroid disease    On Levothyroxine, continue to monitor      Relevant Orders   TSH (Completed)   Urinary tract bacterial infections    Went to urgent care on 05/15/17 for UTI, took Bactrim for 8 days then had hives so stopped it, took a course of steroids and rash resolved. No urinary symptoms presently      Lupus (systemic lupus erythematosus) ([facility])    Has had some increase in flares of pain lately would like to return to a rheumatologist for consideration      Relevant Orders   Ambulatory referral to Rheumatology   Hyperglycemia    hgba1c acceptable, minimize simple carbs. Increase exercise as tolerated.       Relevant Orders   Hemoglobin A1c (Completed)   Comprehensive metabolic panel (Completed)   Anemia    Check CBC today      Relevant Orders   CBC (Completed)   Vitamin D deficiency    .check level today      Relevant Orders   VITAMIN D 25 Hydroxy (Vit-D Deficiency, Fractures) (Completed)   Skin lesion of face    BCC removed  from left side of nose. Has an irregular lesion on skin.she is going to call the Mohs surgeon about follow up      Vitamin B12 deficiency    Check level daily      Relevant Orders   Vitamin B12 (Completed)   Hyperlipidemia    Encouraged heart healthy diet, increase exercise, avoid trans fats, consider a krill oil cap daily      Relevant Orders   Lipid panel  (Completed)    Other Visit Diagnoses    Needs flu shot    -  Primary   Relevant Orders   Flu vaccine HIGH DOSE PF (Fluzone High dose) (Completed)      I am having Ms. Leven maintain her acetaminophen, Probiotic Product (PROBIOTIC DAILY PO), Triamcinolone Acetonide (NASACORT AQ NA), diphenhydrAMINE, Vitamin D, Cyanocobalamin (VITAMIN B 12 PO), MAGNESIUM CITRATE PO, famotidine, omeprazole, and levothyroxine.  No orders of the defined types were placed in this encounter.   CMA served as Education administrator during this visit. History, Physical and Plan performed by medical provider. Documentation and orders reviewed and attested to.  Penni Homans, MD

## 2017-06-29 NOTE — Assessment & Plan Note (Signed)
.  check level today

## 2017-06-29 NOTE — Assessment & Plan Note (Signed)
On Levothyroxine, continue to monitor 

## 2017-06-30 LAB — LIPID PANEL
CHOLESTEROL: 222 mg/dL — AB (ref 0–200)
HDL: 93.6 mg/dL (ref >39.00)
LDL CALC: 102 mg/dL — AB (ref 0–99)
NonHDL: 128.04
TRIGLYCERIDES: 130 mg/dL (ref 0.0–149.0)
Total CHOL/HDL Ratio: 2
VLDL: 26 mg/dL (ref 0.0–40.0)

## 2017-06-30 LAB — CBC
HEMATOCRIT: 37.6 % (ref 36.0–46.0)
Hemoglobin: 12.6 g/dL (ref 12.0–15.0)
MCHC: 33.6 g/dL (ref 30.0–36.0)
MCV: 99.3 fl (ref 78.0–100.0)
Platelets: 341 10*3/uL (ref 150.0–400.0)
RBC: 3.79 Mil/uL — AB (ref 3.87–5.11)
RDW: 13.9 % (ref 11.5–15.5)
WBC: 6.8 10*3/uL (ref 4.0–10.5)

## 2017-06-30 LAB — TSH: TSH: 2.94 u[IU]/mL (ref 0.35–4.50)

## 2017-06-30 LAB — COMPREHENSIVE METABOLIC PANEL
ALBUMIN: 4.2 g/dL (ref 3.5–5.2)
ALT: 11 U/L (ref 0–35)
AST: 17 U/L (ref 0–37)
Alkaline Phosphatase: 37 U/L — ABNORMAL LOW (ref 39–117)
BUN: 16 mg/dL (ref 6–23)
CALCIUM: 9.4 mg/dL (ref 8.4–10.5)
CHLORIDE: 104 meq/L (ref 96–112)
CO2: 23 mEq/L (ref 19–32)
Creatinine, Ser: 0.98 mg/dL (ref 0.40–1.20)
GFR: 57.42 mL/min — AB (ref >60.00)
Glucose, Bld: 83 mg/dL (ref 70–99)
POTASSIUM: 4.4 meq/L (ref 3.5–5.1)
SODIUM: 136 meq/L (ref 135–145)
Total Bilirubin: 0.3 mg/dL (ref 0.2–1.2)
Total Protein: 7.1 g/dL (ref 6.0–8.3)

## 2017-06-30 LAB — HEMOGLOBIN A1C: Hgb A1c MFr Bld: 5.4 % (ref 4.6–6.5)

## 2017-06-30 LAB — VITAMIN B12: Vitamin B-12: 310 pg/mL (ref 211–911)

## 2017-06-30 LAB — VITAMIN D 25 HYDROXY (VIT D DEFICIENCY, FRACTURES): VITD: 37.34 ng/mL (ref 30.00–100.00)

## 2017-07-17 ENCOUNTER — Other Ambulatory Visit: Payer: Self-pay | Admitting: Family Medicine

## 2017-07-19 ENCOUNTER — Other Ambulatory Visit: Payer: Self-pay | Admitting: Family Medicine

## 2017-07-19 ENCOUNTER — Other Ambulatory Visit (HOSPITAL_BASED_OUTPATIENT_CLINIC_OR_DEPARTMENT_OTHER): Payer: Self-pay | Admitting: Internal Medicine

## 2017-07-19 DIAGNOSIS — Z1231 Encounter for screening mammogram for malignant neoplasm of breast: Secondary | ICD-10-CM

## 2017-07-21 ENCOUNTER — Ambulatory Visit (HOSPITAL_BASED_OUTPATIENT_CLINIC_OR_DEPARTMENT_OTHER): Payer: PPO

## 2017-07-22 ENCOUNTER — Ambulatory Visit (HOSPITAL_BASED_OUTPATIENT_CLINIC_OR_DEPARTMENT_OTHER)
Admission: RE | Admit: 2017-07-22 | Discharge: 2017-07-22 | Disposition: A | Discharge status: Home / Self Care | Payer: PPO | Source: Ambulatory Visit | Attending: Family Medicine | Admitting: Family Medicine

## 2017-07-22 DIAGNOSIS — Z1231 Encounter for screening mammogram for malignant neoplasm of breast: Secondary | ICD-10-CM | POA: Insufficient documentation

## 2017-08-09 ENCOUNTER — Telehealth: Payer: Self-pay | Admitting: Family Medicine

## 2017-08-09 NOTE — Telephone Encounter (Signed)
Spoke with Molly Williams to schedule annual wellness visit. Patient stated that she would like to schedule her wellness visit next year. Pt stated that she will give office a call when she is ready to schedule.

## 2017-08-10 DIAGNOSIS — L309 Dermatitis, unspecified: Secondary | ICD-10-CM | POA: Diagnosis not present

## 2017-08-10 DIAGNOSIS — L821 Other seborrheic keratosis: Secondary | ICD-10-CM | POA: Diagnosis not present

## 2017-08-10 DIAGNOSIS — D1801 Hemangioma of skin and subcutaneous tissue: Secondary | ICD-10-CM | POA: Diagnosis not present

## 2017-08-10 DIAGNOSIS — Z85828 Personal history of other malignant neoplasm of skin: Secondary | ICD-10-CM | POA: Diagnosis not present

## 2017-08-10 DIAGNOSIS — D225 Melanocytic nevi of trunk: Secondary | ICD-10-CM | POA: Diagnosis not present

## 2017-08-10 DIAGNOSIS — L814 Other melanin hyperpigmentation: Secondary | ICD-10-CM | POA: Diagnosis not present

## 2017-08-10 DIAGNOSIS — Z23 Encounter for immunization: Secondary | ICD-10-CM | POA: Diagnosis not present

## 2017-08-10 DIAGNOSIS — H01119 Allergic dermatitis of unspecified eye, unspecified eyelid: Secondary | ICD-10-CM | POA: Diagnosis not present

## 2017-09-16 ENCOUNTER — Telehealth: Payer: Self-pay | Admitting: Family Medicine

## 2017-09-16 NOTE — Telephone Encounter (Signed)
thanks

## 2017-09-16 NOTE — Telephone Encounter (Signed)
Copied from [place] [phone]. Topic: Quick Communication - See Telephone Encounter >> Sep 16, 2017  8:36 AM Oneta Rack wrote: CRM for notification. See Telephone encounter for:   09/16/17.  Relation to pt: self Call back number: Pharmacy:  Reason for call:  Patient states she got lost trying to find [facility], [place], [id number], [place], [place] [address] and as per patient Dr. Bo Merino, MD rheumatologist not seeing new patients or unable to see her, patient states she would like referral placed with another physician or office in the [place] location,please advise

## 2017-09-16 NOTE — Telephone Encounter (Signed)
I placed a new referral to Dr. Charlestine Night

## 2017-09-16 NOTE — Telephone Encounter (Signed)
Can we use the old rheumatology referral to switch her to another practice in [place]? If not let me know. Thanks

## 2017-09-17 NOTE — Telephone Encounter (Signed)
Thanks again!

## 2017-09-17 NOTE — Telephone Encounter (Signed)
Dr. Charlestine Night has retired, spoke with patient and I sent referral to [facility] in [place],

## 2017-09-24 NOTE — Progress Notes (Signed)
Subjective:   Molly Williams is a 84 y.o. female who presents for an Initial Medicare Annual Wellness Visit. The Patient was informed that the wellness visit is to identify future health risk and educate and initiate measures that can reduce risk for increased disease through the lifespan.   Describes health as fair, good or great? Good  Review of Systems    No ROS.  Medicare Wellness Visit. Additional risk factors are reflected in the social history Cardiac Risk Factors include: advanced age (>67men, >22 women);dyslipidemia Sleep patterns: Sleeps varies per pt. Takes OTC sleep aid as needed. Home Safety/Smoke Alarms: Feels safe in home. Smoke alarms in place. Lives alone in retirement community.   Female:        Mammo- last 07/22/17:  BI-RADS CATEGORY  1: Negative.      Dexa scan- last 09/17/15: osteoporosis. ORDERED today.    CCS- No longer doing routine screening due to age.   Objective:    Today's Vitals   09/28/17 0917  BP: (!) 118/58  Pulse: 65  SpO2: 98%  Weight: 114 lb 12.8 oz (52.1 kg)  Height: 5' 2.75" (1.594 m)   Body mass index is 20.5 kg/m.  Advanced Directives 09/28/2017 10/09/2016 09/22/2014  Does Patient Have a Medical Advance Directive? Yes Yes No  Type of Paramedic of [place];Living will [place];Living will -  Copy of [place] in Chart? Yes No - copy requested -  Would patient like information on creating a medical advance directive? - - No - patient declined information    Current Medications (verified) Outpatient Encounter Medications as of 09/28/2017  Medication Sig  . acetaminophen (TYLENOL) 500 MG tablet Take 1,000 mg by mouth every 6 (six) hours as needed. pain   . Cholecalciferol (VITAMIN D) 2000 units CAPS Take 2,000 Units by mouth daily.  . Cyanocobalamin (VITAMIN B 12 PO) Take 1,000 mg by mouth daily.  . diphenhydrAMINE (BENADRYL) 25 mg capsule Take 25 mg by mouth every 6 (six)  hours as needed.  . famotidine (PEPCID AC) 10 MG chewable tablet Chew 10 mg by mouth daily as needed for heartburn.  . levothyroxine (SYNTHROID, LEVOTHROID) 25 MCG tablet Take 2 tablets on Tuesday and Saturday all other days take 1 tablet daily  . omeprazole (PRILOSEC) 20 MG capsule TAKE 1 CAPSULE BY MOUTH TWICE DAILY (Patient taking differently: TAKE 1 CAPSULE BY MOUTH  DAILY)  . Probiotic Product (PROBIOTIC DAILY PO) Take by mouth.  Marland Kitchen MAGNESIUM CITRATE PO Take 250 mg by mouth daily.  . [DISCONTINUED] Triamcinolone Acetonide (NASACORT AQ NA) Place into the nose.   No facility-administered encounter medications on file as of 09/28/2017.     Allergies (verified) Codeine; Iodine; Alendronate; Bacitracin; Iodinated diagnostic agents; and Bactrim  [sulfamethoxazole-trimethoprim]   History: Past Medical History:  Diagnosis Date  . Anemia 03/29/2015  . Arthritis   . Atrophic vaginitis   . Cholelithiasis 2008  . Fibrocystic breast disease    bx 2008 neg per pt report  . GERD (gastroesophageal reflux disease)   . Hearing loss   . Hearing loss sensory, bilateral    bil/hearing aids  . Hematuria    chronic Dr. Burnell Blanks  . Hyperlipidemia 11/17/2016  . Lupus (systemic lupus erythematosus) (HCC)    Dr. Charlestine Night  . Macular degeneration 11/17/2016  . Migraine aura without headache    h/o ocular migraines  . Osteopenia   . Preventative health care 11/18/2016  . Renal insufficiency 03/29/2015  .  Retinal detachment with break    right/laser  . Retinal tear of right eye 11/17/2016  . Skin lesion of face 01/09/2016  . Spinal stenosis   . Thyroid disease    hypothyroidism  . Urinary tract bacterial infections    has had urethral dilations in past  . Vitamin B12 deficiency 01/09/2016  . Vitamin D deficiency 07/11/2015   Past Surgical History:  Procedure Laterality Date  . APPENDECTOMY  2006  . CATARACT EXTRACTION W/ INTRAOCULAR LENS  IMPLANT, BILATERAL    . CHOLECYSTECTOMY    . EYE SURGERY  Bilateral    cataracts  . TONSILLECTOMY  1940  . VAGINAL HYSTERECTOMY     partial   Family History  Problem Relation Age of Onset  . Stroke Paternal Grandmother   . Stroke Father   . Coronary artery disease Father   . Coronary artery disease Mother   . Stroke Mother   . Cancer Paternal Aunt        GYN cancer  . Cancer Paternal Uncle        lung, smoker  . Stroke Maternal Grandmother   . Stroke Maternal Grandfather   . Heart disease Paternal Grandfather   . Hydrocephalus Brother        normopressure hydrocephalus, dementia   Social History   Socioeconomic History  . Marital status: Widowed    Spouse name: None  . Number of children: None  . Years of education: None  . Highest education level: None  Social Needs  . Financial resource strain: None  . Food insecurity - worry: None  . Food insecurity - inability: None  . Transportation needs - medical: None  . Transportation needs - non-medical: None  Occupational History  . None  Tobacco Use  . Smoking status: Former Research scientist (life sciences)  . Smokeless tobacco: Never Used  Substance and Sexual Activity  . Alcohol use: Yes    Alcohol/week: 6.0 oz    Types: 10 Glasses of wine per week  . Drug use: No  . Sexual activity: No    Comment: lives alone here because her daughter lives hier, no dietary restrictions, avoids dairy  Other Topics Concern  . None  Social History Narrative  . None    Tobacco Counseling Counseling given: Not Answered   Clinical Intake: Pain : No/denies pain   Activities of Daily Living In your present state of health, do you have any difficulty performing the following activities: 09/28/2017  Hearing? Y  Comment Wearing hearing aids.  Vision? N  Comment hx. cataract sx in 2010 per pt. wears readers. Dr.Digby every year.  Difficulty concentrating or making decisions? N  Walking or climbing stairs? N  Dressing or bathing? N  Doing errands, shopping? N  Preparing Food and eating ? N  Using the  Toilet? N  In the past six months, have you accidently leaked urine? N  Do you have problems with loss of bowel control? N  Managing your Medications? N  Managing your Finances? N  Housekeeping or managing your Housekeeping? N  Some recent data might be hidden     Immunizations and Health Maintenance Immunization History  Administered Date(s) Administered  . Influenza Split 07/10/2011, 07/05/2012  . Influenza Whole 07/15/2010  . Influenza, High Dose Seasonal PF 06/29/2016, 06/29/2017  . Influenza,inj,Quad PF,6+ Mos 06/27/2013, 07/04/2014, 07/11/2015  . Pneumococcal Conjugate-13 07/14/2008  . Pneumococcal Polysaccharide-23 07/11/2015  . Td 09/15/2007  . Zoster 09/14/2006   Health Maintenance Due  Topic Date Due  . TETANUS/TDAP  09/14/2017    Patient Care Team: Mosie Lukes, MD as PCP - General (Family Medicine) [place], Jennefer Bravo, MD as Referring Physician (Dermatology)  Indicate any recent Medical Services you may have received from other than [facility] providers in the past year (date may be approximate).     Assessment:   This is a routine wellness examination for Jenika. Physical assessment deferred to PCP.  Hearing/Vision screen Hearing Screening Comments: Wears hearing aids. Vision Screening Comments: Hx of cataract sx. Wears glasses for reading only. Follows with Dr.Digby yearly.  Dietary issues and exercise activities discussed: Current Exercise Habits: Home exercise routine, Type of exercise: strength training/weights;stretching, Time (Minutes): 15, Frequency (Times/Week): 7, Weekly Exercise (Minutes/Week): 105, Intensity: Mild   Diet (meal preparation, eat out, water intake, caffeinated beverages, dairy products, fruits and vegetables): in general, a "healthy" diet  , well balanced   Goals    . Maintain current healthy lifestyle and independece. (pt-stated)      Depression Screen PHQ 2/9 Scores 09/28/2017 01/09/2016 10/25/2014 07/10/2013  PHQ - 2 Score 0 0  0 0    Fall Risk Fall Risk  09/28/2017 01/09/2016 10/25/2014 07/10/2013  Falls in the past year? No No No No   Cognitive Function: MMSE - Mini Mental State Exam 09/28/2017  Orientation to time 5  Orientation to Place 5  Registration 3  Attention/ Calculation 5  Recall 1  Language- name 2 objects 2  Language- repeat 1  Language- follow 3 step command 3  Language- read & follow direction 1  Write a sentence 1  Copy design 1  Total score 28        Screening Tests Health Maintenance  Topic Date Due  . TETANUS/TDAP  09/14/2017  . MAMMOGRAM  07/22/2018  . INFLUENZA VACCINE  Completed  . DEXA SCAN  Completed  . PNA vac Low Risk Adult  Completed     Plan:   Follow up with Dr.Blyth as scheduled 01/13/18.  Continue to eat heart healthy diet (full of fruits, vegetables, whole grains, lean protein, water--limit salt, fat, and sugar intake) and increase physical activity as tolerated.  Continue doing brain stimulating activities (puzzles, reading, adult coloring books, staying active) to keep memory sharp.   I have ordered your bone density scan. Please schedule.  I have personally reviewed and noted the following in the patient's chart:   . Medical and social history . Use of alcohol, tobacco or illicit drugs  . Current medications and supplements . Functional ability and status . Nutritional status . Physical activity . Advanced directives . List of other physicians . Hospitalizations, surgeries, and ER visits in previous 12 months . Vitals . Screenings to include cognitive, depression, and falls . Referrals and appointments  In addition, I have reviewed and discussed with patient certain preventive protocols, quality metrics, and best practice recommendations. A written personalized care plan for preventive services as well as general preventive health recommendations were provided to patient.     Shela Nevin, [place]   09/28/2017

## 2017-09-28 ENCOUNTER — Ambulatory Visit (INDEPENDENT_AMBULATORY_CARE_PROVIDER_SITE_OTHER): Payer: PPO | Admitting: *Deleted

## 2017-09-28 ENCOUNTER — Ambulatory Visit (HOSPITAL_BASED_OUTPATIENT_CLINIC_OR_DEPARTMENT_OTHER)
Admission: RE | Admit: 2017-09-28 | Discharge: 2017-09-28 | Disposition: A | Discharge status: Home / Self Care | Payer: PPO | Source: Ambulatory Visit | Attending: Family Medicine | Admitting: Family Medicine

## 2017-09-28 ENCOUNTER — Encounter: Payer: Self-pay | Admitting: *Deleted

## 2017-09-28 VITALS — BP 118/58 | HR 65 | Ht 62.75 in | Wt 114.8 lb

## 2017-09-28 DIAGNOSIS — Z0000 Encounter for general adult medical examination without abnormal findings: Secondary | ICD-10-CM

## 2017-09-28 DIAGNOSIS — M810 Age-related osteoporosis without current pathological fracture: Secondary | ICD-10-CM | POA: Diagnosis not present

## 2017-09-28 DIAGNOSIS — Z780 Asymptomatic menopausal state: Secondary | ICD-10-CM

## 2017-09-28 NOTE — Patient Instructions (Signed)
Follow up with Dr.Blyth as scheduled 01/13/18.  Continue to eat heart healthy diet (full of fruits, vegetables, whole grains, lean protein, water--limit salt, fat, and sugar intake) and increase physical activity as tolerated.  Continue doing brain stimulating activities (puzzles, reading, adult coloring books, staying active) to keep memory sharp.   I have ordered your bone density scan. Please schedule.  Molly Williams , Thank you for taking time to come for your Medicare Wellness Visit. I appreciate your ongoing commitment to your health goals. Please review the following plan we discussed and let me know if I can assist you in the future.   These are the goals we discussed: Goals    . Maintain current healthy lifestyle and independece. (pt-stated)       This is a list of the screening recommended for you and due dates:  Health Maintenance  Topic Date Due  . Tetanus Vaccine  09/14/2017  . Mammogram  07/22/2018  . Flu Shot  Completed  . DEXA scan (bone density measurement)  Completed  . Pneumonia vaccines  Completed    Health Maintenance for Postmenopausal Women Menopause is a normal process in which your reproductive ability comes to an end. This process happens gradually over a span of months to years, usually between the ages of 37 and 30. Menopause is complete when you have missed 12 consecutive menstrual periods. It is important to talk with your health care provider about some of the most common conditions that affect postmenopausal women, such as heart disease, cancer, and bone loss (osteoporosis). Adopting a healthy lifestyle and getting preventive care can help to promote your health and wellness. Those actions can also lower your chances of developing some of these common conditions. What should I know about menopause? During menopause, you may experience a number of symptoms, such as:  Moderate-to-severe hot flashes.  Night sweats.  Decrease in sex drive.  Mood  swings.  Headaches.  Tiredness.  Irritability.  Memory problems.  Insomnia.  Choosing to treat or not to treat menopausal changes is an individual decision that you make with your health care provider. What should I know about hormone replacement therapy and supplements? Hormone therapy products are effective for treating symptoms that are associated with menopause, such as hot flashes and night sweats. Hormone replacement carries certain risks, especially as you become older. If you are thinking about using estrogen or estrogen with progestin treatments, discuss the benefits and risks with your health care provider. What should I know about heart disease and stroke? Heart disease, heart attack, and stroke become more likely as you age. This may be due, in part, to the hormonal changes that your body experiences during menopause. These can affect how your body processes dietary fats, triglycerides, and cholesterol. Heart attack and stroke are both medical emergencies. There are many things that you can do to help prevent heart disease and stroke:  Have your blood pressure checked at least every 1-2 years. High blood pressure causes heart disease and increases the risk of stroke.  If you are 36-46 years old, ask your health care provider if you should take aspirin to prevent a heart attack or a stroke.  Do not use any tobacco products, including cigarettes, chewing tobacco, or electronic cigarettes. If you need help quitting, ask your health care provider.  It is important to eat a healthy diet and maintain a healthy weight. ? Be sure to include plenty of vegetables, fruits, low-fat dairy products, and lean protein. ? Avoid eating  foods that are high in solid fats, added sugars, or salt (sodium).  Get regular exercise. This is one of the most important things that you can do for your health. ? Try to exercise for at least 150 minutes each week. The type of exercise that you do should  increase your heart rate and make you sweat. This is known as moderate-intensity exercise. ? Try to do strengthening exercises at least twice each week. Do these in addition to the moderate-intensity exercise.  Know your numbers.Ask your health care provider to check your cholesterol and your blood glucose. Continue to have your blood tested as directed by your health care provider.  What should I know about cancer screening? There are several types of cancer. Take the following steps to reduce your risk and to catch any cancer development as early as possible. Breast Cancer  Practice breast self-awareness. ? This means understanding how your breasts normally appear and feel. ? It also means doing regular breast self-exams. Let your health care provider know about any changes, no matter how small.  If you are 73 or older, have a clinician do a breast exam (clinical breast exam or CBE) every year. Depending on your age, family history, and medical history, it may be recommended that you also have a yearly breast X-ray (mammogram).  If you have a family history of breast cancer, talk with your health care provider about genetic screening.  If you are at high risk for breast cancer, talk with your health care provider about having an MRI and a mammogram every year.  Breast cancer (BRCA) gene test is recommended for women who have family members with BRCA-related cancers. Results of the assessment will determine the need for genetic counseling and BRCA1 and for BRCA2 testing. BRCA-related cancers include these types: ? Breast. This occurs in males or females. ? Ovarian. ? Tubal. This may also be called fallopian tube cancer. ? Cancer of the abdominal or pelvic lining (peritoneal cancer). ? Prostate. ? Pancreatic.  Cervical, Uterine, and Ovarian Cancer Your health care provider may recommend that you be screened regularly for cancer of the pelvic organs. These include your ovaries, uterus,  and vagina. This screening involves a pelvic exam, which includes checking for microscopic changes to the surface of your cervix (Pap test).  For women ages 21-65, health care providers may recommend a pelvic exam and a Pap test every three years. For women ages 48-65, they may recommend the Pap test and pelvic exam, combined with testing for human papilloma virus (HPV), every five years. Some types of HPV increase your risk of cervical cancer. Testing for HPV may also be done on women of any age who have unclear Pap test results.  Other health care providers may not recommend any screening for nonpregnant women who are considered low risk for pelvic cancer and have no symptoms. Ask your health care provider if a screening pelvic exam is right for you.  If you have had past treatment for cervical cancer or a condition that could lead to cancer, you need Pap tests and screening for cancer for at least 20 years after your treatment. If Pap tests have been discontinued for you, your risk factors (such as having a new sexual partner) need to be reassessed to determine if you should start having screenings again. Some women have medical problems that increase the chance of getting cervical cancer. In these cases, your health care provider may recommend that you have screening and Pap tests more  often.  If you have a family history of uterine cancer or ovarian cancer, talk with your health care provider about genetic screening.  If you have vaginal bleeding after reaching menopause, tell your health care provider.  There are currently no reliable tests available to screen for ovarian cancer.  Lung Cancer Lung cancer screening is recommended for adults 48-38 years old who are at high risk for lung cancer because of a history of smoking. A yearly low-dose CT scan of the lungs is recommended if you:  Currently smoke.  Have a history of at least 30 pack-years of smoking and you currently smoke or have quit  within the past 15 years. A pack-year is smoking an average of one pack of cigarettes per day for one year.  Yearly screening should:  Continue until it has been 15 years since you quit.  Stop if you develop a health problem that would prevent you from having lung cancer treatment.  Colorectal Cancer  This type of cancer can be detected and can often be prevented.  Routine colorectal cancer screening usually begins at age 56 and continues through age 48.  If you have risk factors for colon cancer, your health care provider may recommend that you be screened at an earlier age.  If you have a family history of colorectal cancer, talk with your health care provider about genetic screening.  Your health care provider may also recommend using home test kits to check for hidden blood in your stool.  A small camera at the end of a tube can be used to examine your colon directly (sigmoidoscopy or colonoscopy). This is done to check for the earliest forms of colorectal cancer.  Direct examination of the colon should be repeated every 5-10 years until age 26. However, if early forms of precancerous polyps or small growths are found or if you have a family history or genetic risk for colorectal cancer, you may need to be screened more often.  Skin Cancer  Check your skin from head to toe regularly.  Monitor any moles. Be sure to tell your health care provider: ? About any new moles or changes in moles, especially if there is a change in a mole's shape or color. ? If you have a mole that is larger than the size of a pencil eraser.  If any of your family members has a history of skin cancer, especially at a young age, talk with your health care provider about genetic screening.  Always use sunscreen. Apply sunscreen liberally and repeatedly throughout the day.  Whenever you are outside, protect yourself by wearing long sleeves, pants, a wide-brimmed hat, and sunglasses.  What should I know  about osteoporosis? Osteoporosis is a condition in which bone destruction happens more quickly than new bone creation. After menopause, you may be at an increased risk for osteoporosis. To help prevent osteoporosis or the bone fractures that can happen because of osteoporosis, the following is recommended:  If you are 98-10 years old, get at least 1,000 mg of calcium and at least 600 mg of vitamin D per day.  If you are older than age 44 but younger than age 88, get at least 1,200 mg of calcium and at least 600 mg of vitamin D per day.  If you are older than age 61, get at least 1,200 mg of calcium and at least 800 mg of vitamin D per day.  Smoking and excessive alcohol intake increase the risk of osteoporosis. Eat foods that  are rich in calcium and vitamin D, and do weight-bearing exercises several times each week as directed by your health care provider. What should I know about how menopause affects my mental health? Depression may occur at any age, but it is more common as you become older. Common symptoms of depression include:  Low or sad mood.  Changes in sleep patterns.  Changes in appetite or eating patterns.  Feeling an overall lack of motivation or enjoyment of activities that you previously enjoyed.  Frequent crying spells.  Talk with your health care provider if you think that you are experiencing depression. What should I know about immunizations? It is important that you get and maintain your immunizations. These include:  Tetanus, diphtheria, and pertussis (Tdap) booster vaccine.  Influenza every year before the flu season begins.  Pneumonia vaccine.  Shingles vaccine.  Your health care provider may also recommend other immunizations. This information is not intended to replace advice given to you by your health care provider. Make sure you discuss any questions you have with your health care provider. Document Released: 10/23/2005 Document Revised: 03/20/2016  Document Reviewed: 06/04/2015 Elsevier Interactive Patient Education  2018 [employer].

## 2017-10-29 ENCOUNTER — Telehealth: Payer: Self-pay | Admitting: Family Medicine

## 2017-10-29 NOTE — Telephone Encounter (Signed)
Copied from [place] [id number]. Topic: General - Other >> Sep 23, 2017 12:50 PM Laren Everts wrote: Reason for CRM: submitted patients prolia benefits    Prolia benefits received [place] not required 20% co-insurance for Admin and prolia   Patient may owe approximately $250 OOP  Patient due after 10/31/17  Letter mailed to inform patient of benefits and to schedule

## 2017-11-03 ENCOUNTER — Ambulatory Visit (INDEPENDENT_AMBULATORY_CARE_PROVIDER_SITE_OTHER): Payer: PPO | Admitting: *Deleted

## 2017-11-03 DIAGNOSIS — M810 Age-related osteoporosis without current pathological fracture: Secondary | ICD-10-CM

## 2017-11-03 MED ORDER — DENOSUMAB 60 MG/ML ~~LOC~~ SOLN
60.0000 mg | Freq: Once | SUBCUTANEOUS | Status: AC
  Administered 2017-11-03: 60 mg via SUBCUTANEOUS

## 2017-11-03 NOTE — Progress Notes (Signed)
Pre visit review using our clinic review tool, if applicable. No additional management support is needed unless otherwise documented below in the visit note.  Last Prolia given 04/15/17.  Pt denies any previous side effects.  Injection given today in left arm and pt tolerated well.  Pt will be notified when she is due for next injection.

## 2017-11-08 NOTE — Telephone Encounter (Signed)
Patient came in had injection 11/03/17

## 2018-01-03 ENCOUNTER — Other Ambulatory Visit: Payer: Self-pay | Admitting: Family Medicine

## 2018-01-13 ENCOUNTER — Ambulatory Visit (INDEPENDENT_AMBULATORY_CARE_PROVIDER_SITE_OTHER): Payer: PPO | Admitting: Family Medicine

## 2018-01-13 ENCOUNTER — Encounter: Payer: Self-pay | Admitting: Family Medicine

## 2018-01-13 VITALS — BP 114/49 | HR 68 | Temp 98.4°F | Resp 18 | Ht 62.6 in | Wt 112.8 lb

## 2018-01-13 DIAGNOSIS — E782 Mixed hyperlipidemia: Secondary | ICD-10-CM

## 2018-01-13 DIAGNOSIS — Z0001 Encounter for general adult medical examination with abnormal findings: Secondary | ICD-10-CM | POA: Diagnosis not present

## 2018-01-13 DIAGNOSIS — R252 Cramp and spasm: Secondary | ICD-10-CM | POA: Insufficient documentation

## 2018-01-13 DIAGNOSIS — E559 Vitamin D deficiency, unspecified: Secondary | ICD-10-CM

## 2018-01-13 DIAGNOSIS — R739 Hyperglycemia, unspecified: Secondary | ICD-10-CM | POA: Diagnosis not present

## 2018-01-13 DIAGNOSIS — M810 Age-related osteoporosis without current pathological fracture: Secondary | ICD-10-CM | POA: Diagnosis not present

## 2018-01-13 DIAGNOSIS — H3530 Unspecified macular degeneration: Secondary | ICD-10-CM

## 2018-01-13 DIAGNOSIS — M48061 Spinal stenosis, lumbar region without neurogenic claudication: Secondary | ICD-10-CM

## 2018-01-13 DIAGNOSIS — N390 Urinary tract infection, site not specified: Secondary | ICD-10-CM

## 2018-01-13 DIAGNOSIS — A499 Bacterial infection, unspecified: Secondary | ICD-10-CM

## 2018-01-13 DIAGNOSIS — Z0000 Encounter for general adult medical examination without abnormal findings: Secondary | ICD-10-CM

## 2018-01-13 DIAGNOSIS — E538 Deficiency of other specified B group vitamins: Secondary | ICD-10-CM

## 2018-01-13 DIAGNOSIS — M545 Low back pain, unspecified: Secondary | ICD-10-CM | POA: Insufficient documentation

## 2018-01-13 LAB — COMPREHENSIVE METABOLIC PANEL
ALT: 11 U/L (ref 0–35)
AST: 16 U/L (ref 0–37)
Albumin: 4.2 g/dL (ref 3.5–5.2)
Alkaline Phosphatase: 45 U/L (ref 39–117)
BILIRUBIN TOTAL: 0.5 mg/dL (ref 0.2–1.2)
BUN: 19 mg/dL (ref 6–23)
CALCIUM: 9.4 mg/dL (ref 8.4–10.5)
CHLORIDE: 100 meq/L (ref 96–112)
CO2: 26 meq/L (ref 19–32)
Creatinine, Ser: 1 mg/dL (ref 0.40–1.20)
GFR: 56.03 mL/min — AB (ref >60.00)
Glucose, Bld: 90 mg/dL (ref 70–99)
POTASSIUM: 4.8 meq/L (ref 3.5–5.1)
Sodium: 134 mEq/L — ABNORMAL LOW (ref 135–145)
Total Protein: 7.4 g/dL (ref 6.0–8.3)

## 2018-01-13 LAB — CBC
HCT: 38 % (ref 36.0–46.0)
HEMOGLOBIN: 13 g/dL (ref 12.0–15.0)
MCHC: 34.1 g/dL (ref 30.0–36.0)
MCV: 96.9 fl (ref 78.0–100.0)
PLATELETS: 310 10*3/uL (ref 150.0–400.0)
RBC: 3.92 Mil/uL (ref 3.87–5.11)
RDW: 13.4 % (ref 11.5–15.5)
WBC: 6.1 10*3/uL (ref 4.0–10.5)

## 2018-01-13 LAB — LIPID PANEL
CHOLESTEROL: 229 mg/dL — AB (ref 0–200)
HDL: 103.1 mg/dL (ref >39.00)
LDL Cholesterol: 114 mg/dL — ABNORMAL HIGH (ref 0–99)
NonHDL: 125.79
TRIGLYCERIDES: 60 mg/dL (ref 0.0–149.0)
Total CHOL/HDL Ratio: 2
VLDL: 12 mg/dL (ref 0.0–40.0)

## 2018-01-13 LAB — MAGNESIUM: MAGNESIUM: 2.1 mg/dL (ref 1.5–2.5)

## 2018-01-13 LAB — VITAMIN B12: VITAMIN B 12: 170 pg/mL — AB (ref 211–911)

## 2018-01-13 LAB — URINALYSIS, ROUTINE W REFLEX MICROSCOPIC
Bilirubin Urine: NEGATIVE
Ketones, ur: NEGATIVE
Leukocytes, UA: NEGATIVE
Nitrite: NEGATIVE
PH: 6.5 (ref 5.0–8.0)
SPECIFIC GRAVITY, URINE: 1.01 (ref 1.000–1.030)
TOTAL PROTEIN, URINE-UPE24: NEGATIVE
URINE GLUCOSE: NEGATIVE
Urobilinogen, UA: 0.2 (ref 0.0–1.0)
WBC, UA: NONE SEEN (ref 0–?)

## 2018-01-13 LAB — HEMOGLOBIN A1C: HEMOGLOBIN A1C: 5.5 % (ref 4.6–6.5)

## 2018-01-13 LAB — TSH: TSH: 2.59 u[IU]/mL (ref 0.35–4.50)

## 2018-01-13 LAB — VITAMIN D 25 HYDROXY (VIT D DEFICIENCY, FRACTURES): VITD: 38.12 ng/mL (ref 30.00–100.00)

## 2018-01-13 NOTE — Assessment & Plan Note (Signed)
Check level today 

## 2018-01-13 NOTE — Patient Instructions (Addendum)
Hyland's leg cramp medicine at the pharmacy Hydrate well with 64 oz of fluids For Constipation Encouraged increased hydration and fiber in diet. Daily probiotics. If bowels not moving can use MOM 2 tbls po in 4 oz of warm prune juice by mouth every 2-3 days. If no results then repeat in 4 hours with  Dulcolax suppository pr, may repeat again in 4 more hours as needed. Seek care if symptoms worsen. Consider daily Miralax and/or Dulcolax if symptoms persist.   Shingrix is the new shingles shot, 2 shots over 2-6 months at the pharmacy Preventive Care 65 Years and Older, Female Preventive care refers to lifestyle choices and visits with your health care provider that can promote health and wellness. What does preventive care include?  A yearly physical exam. This is also called an annual well check.  Dental exams once or twice a year.  Routine eye exams. Ask your health care provider how often you should have your eyes checked.  Personal lifestyle choices, including: ? Daily care of your teeth and gums. ? Regular physical activity. ? Eating a healthy diet. ? Avoiding tobacco and drug use. ? Limiting alcohol use. ? Practicing safe sex. ? Taking low-dose aspirin every day. ? Taking vitamin and mineral supplements as recommended by your health care provider. What happens during an annual well check? The services and screenings done by your health care provider during your annual well check will depend on your age, overall health, lifestyle risk factors, and family history of disease. Counseling Your health care provider may ask you questions about your:  Alcohol use.  Tobacco use.  Drug use.  Emotional well-being.  Home and relationship well-being.  Sexual activity.  Eating habits.  History of falls.  Memory and ability to understand (cognition).  Work and work Statistician.  Reproductive health.  Screening You may have the following tests or measurements:  Height,  weight, and BMI.  Blood pressure.  Lipid and cholesterol levels. These may be checked every 5 years, or more frequently if you are over 59 years old.  Skin check.  Lung cancer screening. You may have this screening every year starting at age 86 if you have a 30-pack-year history of smoking and currently smoke or have quit within the past 15 years.  Fecal occult blood test (FOBT) of the stool. You may have this test every year starting at age 17.  Flexible sigmoidoscopy or colonoscopy. You may have a sigmoidoscopy every 5 years or a colonoscopy every 10 years starting at age 68.  Hepatitis C blood test.  Hepatitis B blood test.  Sexually transmitted disease (STD) testing.  Diabetes screening. This is done by checking your blood sugar (glucose) after you have not eaten for a while (fasting). You may have this done every 1-3 years.  Bone density scan. This is done to screen for osteoporosis. You may have this done starting at age 59.  Mammogram. This may be done every 1-2 years. Talk to your health care provider about how often you should have regular mammograms.  Talk with your health care provider about your test results, treatment options, and if necessary, the need for more tests. Vaccines Your health care provider may recommend certain vaccines, such as:  Influenza vaccine. This is recommended every year.  Tetanus, diphtheria, and acellular pertussis (Tdap, Td) vaccine. You may need a Td booster every 10 years.  Varicella vaccine. You may need this if you have not been vaccinated.  Zoster vaccine. You may need this after  age 49.  Measles, mumps, and rubella (MMR) vaccine. You may need at least one dose of MMR if you were born in 1957 or later. You may also need a second dose.  Pneumococcal 13-valent conjugate (PCV13) vaccine. One dose is recommended after age 58.  Pneumococcal polysaccharide (PPSV23) vaccine. One dose is recommended after age 61.  Meningococcal vaccine.  You may need this if you have certain conditions.  Hepatitis A vaccine. You may need this if you have certain conditions or if you travel or work in places where you may be exposed to hepatitis A.  Hepatitis B vaccine. You may need this if you have certain conditions or if you travel or work in places where you may be exposed to hepatitis B.  Haemophilus influenzae type b (Hib) vaccine. You may need this if you have certain conditions.  Talk to your health care provider about which screenings and vaccines you need and how often you need them. This information is not intended to replace advice given to you by your health care provider. Make sure you discuss any questions you have with your health care provider. Document Released: 09/27/2015 Document Revised: 05/20/2016 Document Reviewed: 07/02/2015 Elsevier Interactive Patient Education  [employer].

## 2018-01-13 NOTE — Assessment & Plan Note (Signed)
Did not tolerate Ocuvite so she has increased her leafy greens and it has stabilized her eyes. Sees Dr Bing Plume

## 2018-01-13 NOTE — Progress Notes (Signed)
Subjective:  I acted as a Education administrator for [employer]. Yancey Flemings, [place]   Patient ID: Molly Williams, female    DOB: 02/24/1931, 84 y.o.   MRN: [id number]  Chief Complaint  Patient presents with  . Annual Exam    HPI  Patient is in today for annual exam and follow up anemia, back pain, spinal stenosis, hyperlipidemia, vitamin D deficiency and vitamin B 12 deficiency. She feels well today. No recent febrile illness or hospitalizations. She lives alone and takes care of her activities of daily living. Denies CP/palp/SOB/HA/congestion/fevers/GI or GU c/o. Taking meds as prescribed. Continues to struggle low back pain and intermittent  with low back pain and radicular numbness on lateral left hip. Comes and goes. Notes mild intermittent constipation. Moves normal most days but then she will slow down and have to prunes and turnips to get the bowels moving.   Patient Care Team: Mosie Lukes, MD as PCP - General (Family Medicine) [place], Jennefer Bravo, MD as Referring Physician (Dermatology)   Past Medical History:  Diagnosis Date  . Anemia 03/29/2015  . Arthritis   . Atrophic vaginitis   . Cholelithiasis 2008  . Fibrocystic breast disease    bx 2008 neg per pt report  . GERD (gastroesophageal reflux disease)   . Hearing loss   . Hearing loss sensory, bilateral    bil/hearing aids  . Hematuria    chronic Dr. Burnell Blanks  . Hyperlipidemia 11/17/2016  . Lupus (systemic lupus erythematosus) (HCC)    Dr. Charlestine Night  . Macular degeneration 11/17/2016  . Migraine aura without headache    h/o ocular migraines  . Osteopenia   . Preventative health care 11/18/2016  . Renal insufficiency 03/29/2015  . Retinal detachment with break    right/laser  . Retinal tear of right eye 11/17/2016  . Skin lesion of face 01/09/2016  . Spinal stenosis   . Thyroid disease    hypothyroidism  . Urinary tract bacterial infections    has had urethral dilations in past  . Vitamin B12 deficiency 01/09/2016  . Vitamin D  deficiency 07/11/2015    Past Surgical History:  Procedure Laterality Date  . APPENDECTOMY  2006  . CATARACT EXTRACTION W/ INTRAOCULAR LENS  IMPLANT, BILATERAL    . CHOLECYSTECTOMY    . EYE SURGERY Bilateral    cataracts  . TONSILLECTOMY  1940  . VAGINAL HYSTERECTOMY     partial    Family History  Problem Relation Age of Onset  . Stroke Paternal Grandmother   . Stroke Father   . Coronary artery disease Father   . Coronary artery disease Mother   . Stroke Mother   . Cancer Paternal Aunt        GYN cancer  . Cancer Paternal Uncle        lung, smoker  . Stroke Maternal Grandmother   . Stroke Maternal Grandfather   . Heart disease Paternal Grandfather   . Hydrocephalus Brother        normopressure hydrocephalus, dementia    Social History   Socioeconomic History  . Marital status: Widowed    Spouse name: Not on file  . Number of children: Not on file  . Years of education: Not on file  . Highest education level: Not on file  Occupational History  . Not on file  Social Needs  . Financial resource strain: Not on file  . Food insecurity:    Worry: Not on file    Inability: Not on file  .  Transportation needs:    Medical: Not on file    Non-medical: Not on file  Tobacco Use  . Smoking status: Former Research scientist (life sciences)  . Smokeless tobacco: Never Used  Substance and Sexual Activity  . Alcohol use: Yes    Alcohol/week: 6.0 oz    Types: 10 Glasses of wine per week  . Drug use: No  . Sexual activity: Never    Comment: lives alone here because her daughter lives hier, no dietary restrictions, avoids dairy  Lifestyle  . Physical activity:    Days per week: Not on file    Minutes per session: Not on file  . Stress: Not on file  Relationships  . Social connections:    Talks on phone: Not on file    Gets together: Not on file    Attends religious service: Not on file    Active member of club or organization: Not on file    Attends meetings of clubs or organizations: Not on  file    Relationship status: Not on file  . Intimate partner violence:    Fear of current or ex partner: Not on file    Emotionally abused: Not on file    Physically abused: Not on file    Forced sexual activity: Not on file  Other Topics Concern  . Not on file  Social History Narrative  . Not on file    Outpatient Medications Prior to Visit  Medication Sig Dispense Refill  . acetaminophen (TYLENOL) 500 MG tablet Take 1,000 mg by mouth every 6 (six) hours as needed. pain     . Cholecalciferol (VITAMIN D) 2000 units CAPS Take 2,000 Units by mouth daily.    . Cyanocobalamin (VITAMIN B 12 PO) Take 1,000 mg by mouth daily.    . diphenhydrAMINE (BENADRYL) 25 mg capsule Take 25 mg by mouth every 6 (six) hours as needed.    . famotidine (PEPCID AC) 10 MG chewable tablet Chew 10 mg by mouth daily as needed for heartburn.    . levothyroxine (SYNTHROID, LEVOTHROID) 25 MCG tablet Take 2 tablets on Tuesday and Saturday all other days take 1 tablet daily 180 tablet 1  . MAGNESIUM CITRATE PO Take 250 mg by mouth daily.    Marland Kitchen omeprazole (PRILOSEC) 20 MG capsule Take 1 capsule (20 mg total) by mouth daily. 60 capsule 2  . Probiotic Product (PROBIOTIC DAILY PO) Take by mouth.     No facility-administered medications prior to visit.     Allergies  Allergen Reactions  . Codeine Nausea And Vomiting and Nausea Only  . Iodine Hives    Contrast dye  . Alendronate   . Bacitracin Swelling    Ophthalmic ointment  . Iodinated Diagnostic Agents   . Bactrim  [Sulfamethoxazole-Trimethoprim] Hives, Itching and Rash    Review of Systems  Constitutional: Negative for chills, fever and malaise/fatigue.  HENT: Negative for congestion and hearing loss.   Eyes: Negative for discharge.  Respiratory: Negative for cough, sputum production and shortness of breath.   Cardiovascular: Negative for chest pain, palpitations and leg swelling.  Gastrointestinal: Negative for abdominal pain, blood in stool,  constipation, diarrhea, heartburn, nausea and vomiting.  Genitourinary: Negative for dysuria, frequency, hematuria and urgency.  Musculoskeletal: Positive for back pain and joint pain. Negative for falls and myalgias.  Skin: Negative for rash.  Neurological: Positive for sensory change. Negative for dizziness, loss of consciousness, weakness and headaches.  Endo/Heme/Allergies: Negative for environmental allergies. Does not bruise/bleed easily.  Psychiatric/Behavioral: Negative for  depression and suicidal ideas. The patient is not nervous/anxious and does not have insomnia.        Objective:    Physical Exam  Constitutional: She is oriented to person, place, and time. She appears well-developed and well-nourished. No distress.  HENT:  Head: Normocephalic and atraumatic.  Eyes: Conjunctivae are normal.  Neck: Neck supple. No thyromegaly present.  Cardiovascular: Normal rate, regular rhythm and normal heart sounds.  No murmur heard. Pulmonary/Chest: Effort normal and breath sounds normal. No respiratory distress.  Abdominal: Soft. Bowel sounds are normal. She exhibits no distension and no mass. There is no tenderness.  Musculoskeletal: She exhibits no edema.  Lymphadenopathy:    She has no cervical adenopathy.  Neurological: She is alert and oriented to person, place, and time.  Skin: Skin is warm and dry.  Psychiatric: She has a normal mood and affect. Her behavior is normal.    BP (!) 114/49 (BP Location: Left Arm, Patient Position: Sitting, Cuff Size: Normal)   Pulse 68   Temp 98.4 F (36.9 C) (Oral)   Resp 18   Ht 5' 2.6" (1.59 m)   Wt 112 lb 12.8 oz (51.2 kg)   SpO2 100%   BMI 20.24 kg/m  Wt Readings from Last 3 Encounters:  01/13/18 112 lb 12.8 oz (51.2 kg)  09/28/17 114 lb 12.8 oz (52.1 kg)  06/29/17 113 lb 8 oz (51.5 kg)   BP Readings from Last 3 Encounters:  01/13/18 (!) 114/49  09/28/17 (!) 118/58  06/29/17 (!) 120/46     Immunization History  Administered  Date(s) Administered  . Influenza Split 07/10/2011, 07/05/2012  . Influenza Whole 07/15/2010  . Influenza, High Dose Seasonal PF 06/29/2016, 06/29/2017  . Influenza,inj,Quad PF,6+ Mos 06/27/2013, 07/04/2014, 07/11/2015  . Pneumococcal Conjugate-13 07/14/2008  . Pneumococcal Polysaccharide-23 07/11/2015  . Td 09/15/2007  . Zoster 09/14/2006    Health Maintenance  Topic Date Due  . TETANUS/TDAP  01/14/2019 (Originally 09/14/2017)  . INFLUENZA VACCINE  04/14/2018  . MAMMOGRAM  07/22/2018  . DEXA SCAN  Completed  . PNA vac Low Risk Adult  Completed    Lab Results  Component Value Date   WBC 6.8 06/29/2017   HGB 12.6 06/29/2017   HCT 37.6 06/29/2017   PLT 341.0 06/29/2017   GLUCOSE 83 06/29/2017   CHOL 222 (H) 06/29/2017   TRIG 130.0 06/29/2017   HDL 93.60 06/29/2017   LDLCALC 102 (H) 06/29/2017   ALT 11 06/29/2017   AST 17 06/29/2017   NA 136 06/29/2017   K 4.4 06/29/2017   CL 104 06/29/2017   CREATININE 0.98 06/29/2017   BUN 16 06/29/2017   CO2 23 06/29/2017   TSH 2.94 06/29/2017   INR 0.94 09/22/2014   HGBA1C 5.4 06/29/2017    Lab Results  Component Value Date   TSH 2.94 06/29/2017   Lab Results  Component Value Date   WBC 6.8 06/29/2017   HGB 12.6 06/29/2017   HCT 37.6 06/29/2017   MCV 99.3 06/29/2017   PLT 341.0 06/29/2017   Lab Results  Component Value Date   NA 136 06/29/2017   K 4.4 06/29/2017   CO2 23 06/29/2017   GLUCOSE 83 06/29/2017   BUN 16 06/29/2017   CREATININE 0.98 06/29/2017   BILITOT 0.3 06/29/2017   ALKPHOS 37 (L) 06/29/2017   AST 17 06/29/2017   ALT 11 06/29/2017   PROT 7.1 06/29/2017   ALBUMIN 4.2 06/29/2017   CALCIUM 9.4 06/29/2017   ANIONGAP 8 09/22/2014   GFR  57.42 (L) 06/29/2017   Lab Results  Component Value Date   CHOL 222 (H) 06/29/2017   Lab Results  Component Value Date   HDL 93.60 06/29/2017   Lab Results  Component Value Date   LDLCALC 102 (H) 06/29/2017   Lab Results  Component Value Date   TRIG 130.0  06/29/2017   Lab Results  Component Value Date   CHOLHDL 2 06/29/2017   Lab Results  Component Value Date   HGBA1C 5.4 06/29/2017         Assessment & Plan:   Problem List Items Addressed This Visit    Urinary tract bacterial infections    Check urinalysis today      Spinal stenosis    Has been seen by neurosurgery and has declined steroid injections for now.      Osteoporosis    Encouraged to get adequate exercise, calcium and vitamin d intake      Hyperglycemia    hgba1c acceptable, minimize simple carbs. Increase exercise as tolerated.       Vitamin D deficiency    Check level today      Vitamin B12 deficiency    Check level today      Hyperlipidemia    Encouraged heart healthy diet, increase exercise, avoid trans fats, consider a krill oil cap daily      Macular degeneration    Did not tolerate Ocuvite so she has increased her leafy greens and it has stabilized her eyes. Sees Dr Bing Plume      Preventative health care    Patient encouraged to maintain heart healthy diet, regular exercise, adequate sleep. Consider daily probiotics. Take medications as prescribed. Labs ordered today      Low back pain    Was seen by Neurosurgery and has declined. With numbness and discomfort in left hip intermittently. Notes some cramping in feet and legs at times       Muscle cramps    Encouraged to hydrate well and do not skip meals and hydrate well. Hyland's leg cramp med prn, check magnesium level         I am having Molly Williams maintain her acetaminophen, Probiotic Product (PROBIOTIC DAILY PO), diphenhydrAMINE, Vitamin D, Cyanocobalamin (VITAMIN B 12 PO), MAGNESIUM CITRATE PO, famotidine, levothyroxine, and omeprazole.  No orders of the defined types were placed in this encounter.   CMA served as Education administrator during this visit. History, Physical and Plan performed by medical provider. Documentation and orders reviewed and attested to.  Penni Homans, MD

## 2018-01-13 NOTE — Assessment & Plan Note (Addendum)
Check urinalysis today, has seen urgent care with good results. Have told patient for urinalysis and culture if she has any symptoms and then I can treat. Rel of [facility] urgent care

## 2018-01-13 NOTE — Assessment & Plan Note (Signed)
Has been seen by neurosurgery and has declined steroid injections for now.

## 2018-01-13 NOTE — Assessment & Plan Note (Addendum)
Encouraged to hydrate well and do not skip meals and hydrate well. Hyland's leg cramp med prn, check magnesium level

## 2018-01-13 NOTE — Assessment & Plan Note (Signed)
Encouraged heart healthy diet, increase exercise, avoid trans fats, consider a krill oil cap daily 

## 2018-01-13 NOTE — Assessment & Plan Note (Signed)
hgba1c acceptable, minimize simple carbs. Increase exercise as tolerated.  

## 2018-01-13 NOTE — Assessment & Plan Note (Signed)
Was seen by Neurosurgery and has declined. With numbness and discomfort in left hip intermittently. Notes some cramping in feet and legs at times

## 2018-01-13 NOTE — Assessment & Plan Note (Signed)
Encouraged to get adequate exercise, calcium and vitamin d intake 

## 2018-01-13 NOTE — Assessment & Plan Note (Signed)
Patient encouraged to maintain heart healthy diet, regular exercise, adequate sleep. Consider daily probiotics. Take medications as prescribed. Labs ordered today

## 2018-01-14 ENCOUNTER — Encounter: Payer: Self-pay | Admitting: Family Medicine

## 2018-01-14 LAB — URINE CULTURE
MICRO NUMBER: 90537081
SPECIMEN QUALITY:: ADEQUATE

## 2018-01-31 DIAGNOSIS — Z961 Presence of intraocular lens: Secondary | ICD-10-CM | POA: Diagnosis not present

## 2018-01-31 DIAGNOSIS — H00025 Hordeolum internum left lower eyelid: Secondary | ICD-10-CM | POA: Diagnosis not present

## 2018-01-31 DIAGNOSIS — H353132 Nonexudative age-related macular degeneration, bilateral, intermediate dry stage: Secondary | ICD-10-CM | POA: Diagnosis not present

## 2018-03-21 ENCOUNTER — Other Ambulatory Visit: Payer: Self-pay | Admitting: Family Medicine

## 2018-03-29 ENCOUNTER — Other Ambulatory Visit: Payer: Self-pay | Admitting: Family Medicine

## 2018-04-13 ENCOUNTER — Telehealth: Payer: Self-pay | Admitting: Family Medicine

## 2018-04-13 NOTE — Telephone Encounter (Signed)
Prolia benefits received PA not required 20% Admin and Prolia   Patient may owe approximately $74 OOP  Patient due after 05/02/18  Letter mailed to inform patient of benefits and to schedule

## 2018-04-19 ENCOUNTER — Encounter: Payer: Self-pay | Admitting: *Deleted

## 2018-04-25 DIAGNOSIS — Z961 Presence of intraocular lens: Secondary | ICD-10-CM | POA: Diagnosis not present

## 2018-04-25 DIAGNOSIS — H353132 Nonexudative age-related macular degeneration, bilateral, intermediate dry stage: Secondary | ICD-10-CM | POA: Diagnosis not present

## 2018-04-25 DIAGNOSIS — H00025 Hordeolum internum left lower eyelid: Secondary | ICD-10-CM | POA: Diagnosis not present

## 2018-05-04 NOTE — Telephone Encounter (Signed)
Spoke with pt about scheduling nurse visit. She states she has a grant that covers her prolia injection. She has been paying and then getting reimbursement from the grant company. She would like for the grant to cover the cost up front and states they told her to have the pharmacy complete a form that they will give to the pt. I advised her the pharmacy our medication comes from is offsite from [facility]. She will contact the grant company to get more information about coverage for upfront costs and will let us know the outcome. [place] for [facility] / Triage to discuss with pt.

## 2018-05-09 NOTE — Telephone Encounter (Signed)
Patient has had the necessary lab work for the [employer] shot.  -Bridgette can you schedule patient a nurse visit for Prolia inhection sometime next week giving time for the medication to come in.   -Gilmore Laroche can you order Prolia for patient   Thanks

## 2018-05-09 NOTE — Telephone Encounter (Signed)
Patient is calling and ready to schedule CB# [phone]

## 2018-05-09 NOTE — Telephone Encounter (Signed)
Pt scheduled for 05/18/18 @ 9:15am

## 2018-05-10 NOTE — Telephone Encounter (Signed)
Medication is in fridge for pt. 

## 2018-05-18 ENCOUNTER — Ambulatory Visit (INDEPENDENT_AMBULATORY_CARE_PROVIDER_SITE_OTHER): Payer: PPO

## 2018-05-18 DIAGNOSIS — M810 Age-related osteoporosis without current pathological fracture: Secondary | ICD-10-CM

## 2018-05-18 MED ORDER — DENOSUMAB 60 MG/ML ~~LOC~~ SOSY
60.0000 mg | PREFILLED_SYRINGE | Freq: Once | SUBCUTANEOUS | Status: AC
  Administered 2018-05-18: 60 mg via SUBCUTANEOUS

## 2018-05-18 NOTE — Progress Notes (Addendum)
Pre visit review using our clinic tool,if applicable. No additional management support is needed unless otherwise documented below in the visit note.   Patient in for Prolia injection per order from Dr. Charlett Blake.  Given 60 mg SQ left arm. Patient tolerated well.  Reminder card given for next injection.   Kathlene November, MD

## 2018-05-23 DIAGNOSIS — H01001 Unspecified blepharitis right upper eyelid: Secondary | ICD-10-CM | POA: Diagnosis not present

## 2018-05-23 DIAGNOSIS — H00025 Hordeolum internum left lower eyelid: Secondary | ICD-10-CM | POA: Diagnosis not present

## 2018-05-23 DIAGNOSIS — Z961 Presence of intraocular lens: Secondary | ICD-10-CM | POA: Diagnosis not present

## 2018-05-23 DIAGNOSIS — H01002 Unspecified blepharitis right lower eyelid: Secondary | ICD-10-CM | POA: Diagnosis not present

## 2018-06-09 ENCOUNTER — Other Ambulatory Visit: Payer: Self-pay | Admitting: Family Medicine

## 2018-06-09 DIAGNOSIS — Z1231 Encounter for screening mammogram for malignant neoplasm of breast: Secondary | ICD-10-CM

## 2018-06-27 ENCOUNTER — Other Ambulatory Visit: Payer: Self-pay | Admitting: Family Medicine

## 2018-07-11 ENCOUNTER — Ambulatory Visit: Payer: PPO

## 2018-07-22 ENCOUNTER — Ambulatory Visit (HOSPITAL_BASED_OUTPATIENT_CLINIC_OR_DEPARTMENT_OTHER)
Admission: RE | Admit: 2018-07-22 | Discharge: 2018-07-22 | Disposition: A | Discharge status: Home / Self Care | Payer: PPO | Source: Ambulatory Visit | Attending: Family Medicine | Admitting: Family Medicine

## 2018-07-22 ENCOUNTER — Encounter: Payer: Self-pay | Admitting: Family Medicine

## 2018-07-22 ENCOUNTER — Ambulatory Visit (INDEPENDENT_AMBULATORY_CARE_PROVIDER_SITE_OTHER): Payer: PPO | Admitting: Family Medicine

## 2018-07-22 VITALS — BP 108/50 | HR 63 | Temp 98.5°F | Resp 16 | Ht 62.0 in | Wt 114.4 lb

## 2018-07-22 DIAGNOSIS — R739 Hyperglycemia, unspecified: Secondary | ICD-10-CM | POA: Diagnosis not present

## 2018-07-22 DIAGNOSIS — M545 Low back pain, unspecified: Secondary | ICD-10-CM

## 2018-07-22 DIAGNOSIS — M4316 Spondylolisthesis, lumbar region: Secondary | ICD-10-CM | POA: Diagnosis not present

## 2018-07-22 DIAGNOSIS — R921 Mammographic calcification found on diagnostic imaging of breast: Secondary | ICD-10-CM | POA: Insufficient documentation

## 2018-07-22 DIAGNOSIS — Z1231 Encounter for screening mammogram for malignant neoplasm of breast: Secondary | ICD-10-CM | POA: Diagnosis not present

## 2018-07-22 DIAGNOSIS — M810 Age-related osteoporosis without current pathological fracture: Secondary | ICD-10-CM | POA: Diagnosis not present

## 2018-07-22 DIAGNOSIS — E782 Mixed hyperlipidemia: Secondary | ICD-10-CM

## 2018-07-22 DIAGNOSIS — M4186 Other forms of scoliosis, lumbar region: Secondary | ICD-10-CM | POA: Insufficient documentation

## 2018-07-22 DIAGNOSIS — E079 Disorder of thyroid, unspecified: Secondary | ICD-10-CM | POA: Diagnosis not present

## 2018-07-22 DIAGNOSIS — M5136 Other intervertebral disc degeneration, lumbar region: Secondary | ICD-10-CM | POA: Diagnosis not present

## 2018-07-22 DIAGNOSIS — N289 Disorder of kidney and ureter, unspecified: Secondary | ICD-10-CM

## 2018-07-22 DIAGNOSIS — E538 Deficiency of other specified B group vitamins: Secondary | ICD-10-CM | POA: Diagnosis not present

## 2018-07-22 DIAGNOSIS — A499 Bacterial infection, unspecified: Secondary | ICD-10-CM | POA: Diagnosis not present

## 2018-07-22 DIAGNOSIS — E559 Vitamin D deficiency, unspecified: Secondary | ICD-10-CM | POA: Diagnosis not present

## 2018-07-22 DIAGNOSIS — N390 Urinary tract infection, site not specified: Secondary | ICD-10-CM

## 2018-07-22 LAB — CBC
HEMATOCRIT: 37.2 % (ref 36.0–46.0)
HEMOGLOBIN: 12.6 g/dL (ref 12.0–15.0)
MCHC: 33.8 g/dL (ref 30.0–36.0)
MCV: 97.4 fl (ref 78.0–100.0)
Platelets: 336 10*3/uL (ref 150.0–400.0)
RBC: 3.82 Mil/uL — AB (ref 3.87–5.11)
RDW: 14.2 % (ref 11.5–15.5)
WBC: 6.3 10*3/uL (ref 4.0–10.5)

## 2018-07-22 LAB — COMPREHENSIVE METABOLIC PANEL
ALBUMIN: 4.5 g/dL (ref 3.5–5.2)
ALK PHOS: 43 U/L (ref 39–117)
ALT: 11 U/L (ref 0–35)
AST: 17 U/L (ref 0–37)
BUN: 18 mg/dL (ref 6–23)
CO2: 27 mEq/L (ref 19–32)
CREATININE: 0.93 mg/dL (ref 0.40–1.20)
Calcium: 9.6 mg/dL (ref 8.4–10.5)
Chloride: 102 mEq/L (ref 96–112)
GFR: 60.85 mL/min (ref >60.00)
Glucose, Bld: 96 mg/dL (ref 70–99)
Potassium: 4.9 mEq/L (ref 3.5–5.1)
Sodium: 135 mEq/L (ref 135–145)
TOTAL PROTEIN: 7.6 g/dL (ref 6.0–8.3)
Total Bilirubin: 0.5 mg/dL (ref 0.2–1.2)

## 2018-07-22 LAB — LIPID PANEL
CHOLESTEROL: 227 mg/dL — AB (ref 0–200)
HDL: 102.1 mg/dL (ref >39.00)
LDL CALC: 109 mg/dL — AB (ref 0–99)
NONHDL: 125.07
Total CHOL/HDL Ratio: 2
Triglycerides: 81 mg/dL (ref 0.0–149.0)
VLDL: 16.2 mg/dL (ref 0.0–40.0)

## 2018-07-22 LAB — URINALYSIS, ROUTINE W REFLEX MICROSCOPIC
Bilirubin Urine: NEGATIVE
KETONES UR: NEGATIVE
Leukocytes, UA: NEGATIVE
Nitrite: NEGATIVE
SPECIFIC GRAVITY, URINE: 1.01 (ref 1.000–1.030)
Total Protein, Urine: NEGATIVE
Urine Glucose: NEGATIVE
Urobilinogen, UA: 0.2 (ref 0.0–1.0)
WBC UA: NONE SEEN (ref 0–?)
pH: 6 (ref 5.0–8.0)

## 2018-07-22 LAB — HEMOGLOBIN A1C: Hgb A1c MFr Bld: 5.3 % (ref 4.6–6.5)

## 2018-07-22 LAB — TSH: TSH: 3.18 u[IU]/mL (ref 0.35–4.50)

## 2018-07-22 LAB — VITAMIN D 25 HYDROXY (VIT D DEFICIENCY, FRACTURES): VITD: 40.91 ng/mL (ref 30.00–100.00)

## 2018-07-22 LAB — VITAMIN B12: Vitamin B-12: 160 pg/mL — ABNORMAL LOW (ref 211–911)

## 2018-07-22 NOTE — Assessment & Plan Note (Signed)
On Levothyroxine, continue to monitor 

## 2018-07-22 NOTE — Assessment & Plan Note (Signed)
Supplement and monitor 

## 2018-07-22 NOTE — Assessment & Plan Note (Signed)
Check labs, cmp

## 2018-07-22 NOTE — Assessment & Plan Note (Signed)
Check UA today.  

## 2018-07-22 NOTE — Assessment & Plan Note (Signed)
Encouraged to get adequate exercise, calcium and vitamin d intake. Check vitamin d level

## 2018-07-22 NOTE — Assessment & Plan Note (Signed)
hgba1c acceptable, minimize simple carbs. Increase exercise as tolerated.  

## 2018-07-22 NOTE — Assessment & Plan Note (Signed)
Encouraged heart healthy diet, increase exercise, avoid trans fats, consider a krill oil cap daily 

## 2018-07-23 ENCOUNTER — Encounter: Payer: Self-pay | Admitting: Family Medicine

## 2018-07-23 LAB — URINE CULTURE
MICRO NUMBER: 91348116
SPECIMEN QUALITY:: ADEQUATE

## 2018-07-25 ENCOUNTER — Ambulatory Visit (HOSPITAL_BASED_OUTPATIENT_CLINIC_OR_DEPARTMENT_OTHER): Payer: PPO

## 2018-07-25 ENCOUNTER — Other Ambulatory Visit: Payer: Self-pay | Admitting: Family Medicine

## 2018-07-25 DIAGNOSIS — E538 Deficiency of other specified B group vitamins: Secondary | ICD-10-CM

## 2018-07-25 NOTE — Progress Notes (Signed)
Subjective:    Patient ID: Molly Williams, female    DOB: 1931/01/28, 84 y.o.   MRN: [id number]  Chief Complaint  Patient presents with  . Hyperlipidemia    Pt here for follow up  . Hyperglycemia    Pt here for follow up    HPI Patient is in today for follow up. She feels well at the present time except for a flare in back pain.  No falls or injury.  No radicular symptoms but she does note she sees a bit more scoliosis in her back and has been increasingly uncomfortable over the last 2 weeks.  She notes if she sits or lies down the pain is improved some with standing, walking, sitting are all painful. Denies CP/palp/SOB/HA/congestion/fevers/GI or GU c/o. Taking meds as prescribed  Past Medical History:  Diagnosis Date  . Anemia 03/29/2015  . Arthritis   . Atrophic vaginitis   . Cholelithiasis 2008  . Fibrocystic breast disease    bx 2008 neg per pt report  . GERD (gastroesophageal reflux disease)   . Hearing loss   . Hearing loss sensory, bilateral    bil/hearing aids  . Hematuria    chronic Dr. Burnell Blanks  . Hyperlipidemia 11/17/2016  . Lupus (systemic lupus erythematosus) (HCC)    Dr. Charlestine Night  . Macular degeneration 11/17/2016  . Migraine aura without headache    h/o ocular migraines  . Osteopenia   . Preventative health care 11/18/2016  . Renal insufficiency 03/29/2015  . Retinal detachment with break    right/laser  . Retinal tear of right eye 11/17/2016  . Skin lesion of face 01/09/2016  . Spinal stenosis   . Thyroid disease    hypothyroidism  . Urinary tract bacterial infections    has had urethral dilations in past  . Vitamin B12 deficiency 01/09/2016  . Vitamin D deficiency 07/11/2015    Past Surgical History:  Procedure Laterality Date  . APPENDECTOMY  2006  . CATARACT EXTRACTION W/ INTRAOCULAR LENS  IMPLANT, BILATERAL    . CHOLECYSTECTOMY    . EYE SURGERY Bilateral    cataracts  . TONSILLECTOMY  1940  . VAGINAL HYSTERECTOMY     partial    Family History   Problem Relation Age of Onset  . Stroke Paternal Grandmother   . Stroke Father   . Coronary artery disease Father   . Coronary artery disease Mother   . Stroke Mother   . Cancer Paternal Aunt        GYN cancer  . Cancer Paternal Uncle        lung, smoker  . Stroke Maternal Grandmother   . Stroke Maternal Grandfather   . Heart disease Paternal Grandfather   . Hydrocephalus Brother        normopressure hydrocephalus, dementia    Social History   Socioeconomic History  . Marital status: Widowed    Spouse name: Not on file  . Number of children: Not on file  . Years of education: Not on file  . Highest education level: Not on file  Occupational History  . Not on file  Social Needs  . Financial resource strain: Not on file  . Food insecurity:    Worry: Not on file    Inability: Not on file  . Transportation needs:    Medical: Not on file    Non-medical: Not on file  Tobacco Use  . Smoking status: Former Research scientist (life sciences)  . Smokeless tobacco: Never Used  Substance and Sexual Activity  .  Alcohol use: Yes    Alcohol/week: 10.0 standard drinks    Types: 10 Glasses of wine per week  . Drug use: No  . Sexual activity: Never    Comment: lives alone here because her daughter lives hier, no dietary restrictions, avoids dairy  Lifestyle  . Physical activity:    Days per week: Not on file    Minutes per session: Not on file  . Stress: Not on file  Relationships  . Social connections:    Talks on phone: Not on file    Gets together: Not on file    Attends religious service: Not on file    Active member of club or organization: Not on file    Attends meetings of clubs or organizations: Not on file    Relationship status: Not on file  . Intimate partner violence:    Fear of current or ex partner: Not on file    Emotionally abused: Not on file    Physically abused: Not on file    Forced sexual activity: Not on file  Other Topics Concern  . Not on file  Social History Narrative    . Not on file    Outpatient Medications Prior to Visit  Medication Sig Dispense Refill  . acetaminophen (TYLENOL) 500 MG tablet Take 1,000 mg by mouth every 6 (six) hours as needed. pain     . Cholecalciferol (VITAMIN D) 2000 units CAPS Take 2,000 Units by mouth daily.    . Cyanocobalamin (VITAMIN B 12 PO) Take 1,000 mg by mouth daily.    . diphenhydrAMINE (BENADRYL) 25 mg capsule Take 25 mg by mouth every 6 (six) hours as needed.    . famotidine (PEPCID AC) 10 MG chewable tablet Chew 10 mg by mouth daily as needed for heartburn.    . levothyroxine (SYNTHROID, LEVOTHROID) 25 MCG tablet TAKE 2 TABLETS BY MOUTH ON TUESDAY AND SATURDAY. ALL OTHER DAYS TAKE 1 TABLET BY MOUTH DAILY. 180 tablet 1  . omeprazole (PRILOSEC) 20 MG capsule TAKE 1 CAPSULE BY MOUTH ONCE DAILY 90 capsule 1  . Probiotic Product (PROBIOTIC DAILY PO) Take by mouth.    Marland Kitchen MAGNESIUM CITRATE PO Take 250 mg by mouth daily.     No facility-administered medications prior to visit.     Allergies  Allergen Reactions  . Codeine Nausea And Vomiting and Nausea Only  . Iodine Hives    Contrast dye  . Alendronate   . Bacitracin Swelling    Ophthalmic ointment  . Iodinated Diagnostic Agents   . Bactrim  [Sulfamethoxazole-Trimethoprim] Hives, Itching and Rash    Review of Systems  Constitutional: Negative for fever and malaise/fatigue.  HENT: Negative for congestion.   Eyes: Negative for blurred vision.  Respiratory: Negative for shortness of breath.   Cardiovascular: Negative for chest pain, palpitations and leg swelling.  Gastrointestinal: Negative for abdominal pain, blood in stool and nausea.  Genitourinary: Negative for dysuria and frequency.  Musculoskeletal: Positive for myalgias. Negative for falls.  Skin: Negative for rash.  Neurological: Negative for dizziness, loss of consciousness and headaches.  Endo/Heme/Allergies: Negative for environmental allergies.  Psychiatric/Behavioral: Negative for depression. The  patient is not nervous/anxious.        Objective:    Physical Exam  Constitutional: She is oriented to person, place, and time. She appears well-developed and well-nourished. No distress.  HENT:  Head: Normocephalic and atraumatic.  Right Ear: External ear normal.  Left Ear: External ear normal.  Nose: Nose normal.  Mouth/Throat: Oropharynx  is clear and moist. No oropharyngeal exudate.  Eyes: Pupils are equal, round, and reactive to light. Conjunctivae are normal. Right eye exhibits no discharge. Left eye exhibits no discharge. No scleral icterus.  Neck: Normal range of motion. Neck supple. No thyromegaly present.  Cardiovascular: Normal rate, regular rhythm and intact distal pulses.  Murmur heard. Pulmonary/Chest: Effort normal and breath sounds normal. No respiratory distress. She has no wheezes. She has no rales.  Abdominal: Soft. Bowel sounds are normal. She exhibits no distension and no mass. There is no tenderness.  Musculoskeletal: Normal range of motion. She exhibits no edema or tenderness.  Lymphadenopathy:    She has no cervical adenopathy.  Neurological: She is alert and oriented to person, place, and time. She has normal reflexes. She displays normal reflexes. No cranial nerve deficit. Coordination normal.  Skin: Skin is warm and dry. No rash noted. She is not diaphoretic.  Psychiatric: She has a normal mood and affect.  Nursing note and vitals reviewed.   BP (!) 108/50 (BP Location: Right Arm, Cuff Size: Normal)   Pulse 63   Temp 98.5 F (36.9 C) (Oral)   Resp 16   Ht 5\' 2"  (1.575 m)   Wt 114 lb 6.4 oz (51.9 kg)   SpO2 100%   BMI 20.92 kg/m  Wt Readings from Last 3 Encounters:  07/22/18 114 lb 6.4 oz (51.9 kg)  01/13/18 112 lb 12.8 oz (51.2 kg)  09/28/17 114 lb 12.8 oz (52.1 kg)     Lab Results  Component Value Date   WBC 6.3 07/22/2018   HGB 12.6 07/22/2018   HCT 37.2 07/22/2018   PLT 336.0 07/22/2018   GLUCOSE 96 07/22/2018   CHOL 227 (H) 07/22/2018    TRIG 81.0 07/22/2018   HDL 102.10 07/22/2018   LDLCALC 109 (H) 07/22/2018   ALT 11 07/22/2018   AST 17 07/22/2018   NA 135 07/22/2018   K 4.9 07/22/2018   CL 102 07/22/2018   CREATININE 0.93 07/22/2018   BUN 18 07/22/2018   CO2 27 07/22/2018   TSH 3.18 07/22/2018   INR 0.94 09/22/2014   HGBA1C 5.3 07/22/2018    Lab Results  Component Value Date   TSH 3.18 07/22/2018   Lab Results  Component Value Date   WBC 6.3 07/22/2018   HGB 12.6 07/22/2018   HCT 37.2 07/22/2018   MCV 97.4 07/22/2018   PLT 336.0 07/22/2018   Lab Results  Component Value Date   NA 135 07/22/2018   K 4.9 07/22/2018   CO2 27 07/22/2018   GLUCOSE 96 07/22/2018   BUN 18 07/22/2018   CREATININE 0.93 07/22/2018   BILITOT 0.5 07/22/2018   ALKPHOS 43 07/22/2018   AST 17 07/22/2018   ALT 11 07/22/2018   PROT 7.6 07/22/2018   ALBUMIN 4.5 07/22/2018   CALCIUM 9.6 07/22/2018   ANIONGAP 8 09/22/2014   GFR 60.85 07/22/2018   Lab Results  Component Value Date   CHOL 227 (H) 07/22/2018   Lab Results  Component Value Date   HDL 102.10 07/22/2018   Lab Results  Component Value Date   LDLCALC 109 (H) 07/22/2018   Lab Results  Component Value Date   TRIG 81.0 07/22/2018   Lab Results  Component Value Date   CHOLHDL 2 07/22/2018   Lab Results  Component Value Date   HGBA1C 5.3 07/22/2018       Assessment & Plan:   Problem List Items Addressed This Visit    Thyroid disease  On Levothyroxine, continue to monitor      Urinary tract bacterial infections    Check UA today      Relevant Orders   CBC (Completed)   Urinalysis   Urine Culture (Completed)   Osteoporosis    Encouraged to get adequate exercise, calcium and vitamin d intake. Check vitamin d level      Hyperglycemia    hgba1c acceptable, minimize simple carbs. Increase exercise as tolerated.       Relevant Orders   Hemoglobin A1c (Completed)   Comprehensive metabolic panel (Completed)   Renal insufficiency     Check labs, cmp      Vitamin D deficiency    Supplement and monitor      Relevant Orders   VITAMIN D 25 Hydroxy (Vit-D Deficiency, Fractures) (Completed)   Vitamin B12 deficiency    Supplement and monitor      Relevant Orders   CBC (Completed)   TSH (Completed)   Vitamin B12 (Completed)   Hyperlipidemia    Encouraged heart healthy diet, increase exercise, avoid trans fats, consider a krill oil cap daily      Relevant Orders   Lipid panel (Completed)   Low back pain - Primary   Relevant Orders   DG Lumbar Spine Complete (Completed)   Ambulatory referral to Physical Therapy      I have discontinued Molly Williams's MAGNESIUM CITRATE PO. I am also having her maintain her acetaminophen, Probiotic Product (PROBIOTIC DAILY PO), diphenhydrAMINE, Vitamin D, Cyanocobalamin (VITAMIN B 12 PO), famotidine, levothyroxine, and omeprazole.  No orders of the defined types were placed in this encounter.    Penni Homans, MD

## 2018-07-26 ENCOUNTER — Other Ambulatory Visit: Payer: Self-pay | Admitting: Family Medicine

## 2018-07-26 DIAGNOSIS — R928 Other abnormal and inconclusive findings on diagnostic imaging of breast: Secondary | ICD-10-CM

## 2018-07-28 ENCOUNTER — Telehealth: Payer: Self-pay | Admitting: *Deleted

## 2018-07-28 NOTE — Telephone Encounter (Signed)
Received Physician Orders [place]; forwarded to provider/SLS 11/14

## 2018-08-01 ENCOUNTER — Ambulatory Visit
Admission: RE | Admit: 2018-08-01 | Discharge: 2018-08-01 | Disposition: A | Discharge status: Home / Self Care | Payer: PPO | Source: Ambulatory Visit | Attending: Family Medicine | Admitting: Family Medicine

## 2018-08-01 ENCOUNTER — Other Ambulatory Visit: Payer: Self-pay | Admitting: Family Medicine

## 2018-08-01 DIAGNOSIS — R921 Mammographic calcification found on diagnostic imaging of breast: Secondary | ICD-10-CM | POA: Diagnosis not present

## 2018-08-01 DIAGNOSIS — R928 Other abnormal and inconclusive findings on diagnostic imaging of breast: Secondary | ICD-10-CM

## 2018-08-03 ENCOUNTER — Ambulatory Visit
Admission: RE | Admit: 2018-08-03 | Discharge: 2018-08-03 | Disposition: A | Discharge status: Home / Self Care | Payer: PPO | Source: Ambulatory Visit | Attending: Family Medicine | Admitting: Family Medicine

## 2018-08-03 ENCOUNTER — Other Ambulatory Visit: Payer: Self-pay | Admitting: Family Medicine

## 2018-08-03 DIAGNOSIS — R928 Other abnormal and inconclusive findings on diagnostic imaging of breast: Secondary | ICD-10-CM

## 2018-08-03 DIAGNOSIS — R921 Mammographic calcification found on diagnostic imaging of breast: Secondary | ICD-10-CM | POA: Diagnosis not present

## 2018-08-03 DIAGNOSIS — N6011 Diffuse cystic mastopathy of right breast: Secondary | ICD-10-CM | POA: Diagnosis not present

## 2018-08-09 ENCOUNTER — Encounter: Payer: Self-pay | Admitting: Physical Therapy

## 2018-08-09 ENCOUNTER — Ambulatory Visit: Payer: PPO | Attending: Family Medicine | Admitting: Physical Therapy

## 2018-08-09 ENCOUNTER — Other Ambulatory Visit: Payer: Self-pay

## 2018-08-09 DIAGNOSIS — R29898 Other symptoms and signs involving the musculoskeletal system: Secondary | ICD-10-CM | POA: Insufficient documentation

## 2018-08-09 DIAGNOSIS — G8929 Other chronic pain: Secondary | ICD-10-CM | POA: Diagnosis not present

## 2018-08-09 DIAGNOSIS — M5440 Lumbago with sciatica, unspecified side: Secondary | ICD-10-CM | POA: Insufficient documentation

## 2018-08-09 DIAGNOSIS — R293 Abnormal posture: Secondary | ICD-10-CM | POA: Insufficient documentation

## 2018-08-09 DIAGNOSIS — R262 Difficulty in walking, not elsewhere classified: Secondary | ICD-10-CM

## 2018-08-09 NOTE — Therapy (Addendum)
[facility] [address]  [place], [place], [address] Phone: [phone]   Fax:  [phone]  Physical Therapy Evaluation  Patient Details  Name: Molly Williams MRN: [id number] Date of Birth: 14-Oct-1930 Referring Provider (PT): Penni Homans, MD   Progress Note Reporting Period 08/09/18 to 08/09/18  See note below for Objective Data and Assessment of Progress/Goals.    Encounter Date: 08/09/2018  PT End of Session - 08/09/18 1652    Visit Number  1    Number of Visits  5    Date for PT Re-Evaluation  09/06/18    Authorization Type  [employer]     PT Start Time  1313    PT Stop Time  1403    PT Time Calculation (min)  50 min    Activity Tolerance  Patient tolerated treatment well    Behavior During Therapy  WFL for tasks assessed/performed       Past Medical History:  Diagnosis Date  . Anemia 03/29/2015  . Arthritis   . Atrophic vaginitis   . Cholelithiasis 2008  . Fibrocystic breast disease    bx 2008 neg per pt report  . GERD (gastroesophageal reflux disease)   . Hearing loss   . Hearing loss sensory, bilateral    bil/hearing aids  . Hematuria    chronic Dr. Burnell Blanks  . Hyperlipidemia 11/17/2016  . Lupus (systemic lupus erythematosus) (HCC)    Dr. Charlestine Night  . Macular degeneration 11/17/2016  . Migraine aura without headache    h/o ocular migraines  . Osteopenia   . Preventative health care 11/18/2016  . Renal insufficiency 03/29/2015  . Retinal detachment with break    right/laser  . Retinal tear of right eye 11/17/2016  . Skin lesion of face 01/09/2016  . Spinal stenosis   . Thyroid disease    hypothyroidism  . Urinary tract bacterial infections    has had urethral dilations in past  . Vitamin B12 deficiency 01/09/2016  . Vitamin D deficiency 07/11/2015    Past Surgical History:  Procedure Laterality Date  . APPENDECTOMY  2006  . CATARACT EXTRACTION W/ INTRAOCULAR LENS  IMPLANT, BILATERAL     . CHOLECYSTECTOMY    . EYE SURGERY Bilateral    cataracts  . TONSILLECTOMY  1940  . VAGINAL HYSTERECTOMY     partial    There were no vitals filed for this visit.   Subjective Assessment - 08/09/18 1315    Subjective  Patient reports LBP for the past 10 years or so. Has had recent flare up in the past 6 months. Reports she has scoliosis which she reports has worsened- reports xray has shown no compression fxs but did show scoliosis. Pain is bilateral R>L with intermittent sharp radiation down front of B LEs down to toes "like sciatica" but not lately. Worse with lifting, walking, rainy cold days, performing activities standing up. Notes that her posture is also getting worse. Better with HEP exercises given by a previous PT. Patient reports she has not had any falls but would like to work on her balance.    Pertinent History  anemia, GERD, hearing loss, HLD, SLE, migraines, osteopenia, renal insufficiency, spinal stenosis, hypothyroidism    Limitations  Lifting;Standing;Walking;House hold activities    How long can you sit comfortably?  unlimited    How long can you stand comfortably?  5 min    How long can you walk comfortably?  5 min  Diagnostic tests  07/22/18 lumbar xray: Degenerative disc and facet disease changes of the lumbar spine with dextroconvex scoliosis and chronic grade 1 anterolisthesis L4-L5.    Patient Stated Goals  "strengthening, primarily and balance exercises"    Currently in Pain?  Yes    Pain Score  0-No pain    Pain Location  Back    Pain Orientation  Right;Left;Lower    Pain Descriptors / Indicators  Aching;Dull    Pain Type  Chronic pain         [facility] PT Assessment - 08/09/18 1335      Assessment   Medical Diagnosis  LBP    Referring Provider (PT)  Penni Homans, MD    Onset Date/Surgical Date  02/06/18    Next MD Visit  --   after last PT visit   Prior Therapy  Yes- for back and balance      Precautions   Precautions  --   osteopenia, hearing  loss     Restrictions   Weight Bearing Restrictions  No      Balance Screen   Has the patient fallen in the past 6 months  No    Has the patient had a decrease in activity level because of a fear of falling?   No    Is the patient reluctant to leave their home because of a fear of falling?   No      Home Social worker  Private residence    Living Arrangements  Alone    Available Help at Discharge  Family    Type of [place] Access  Level entry    Home Layout  One level      Prior Function   Level of Independence  Independent with basic ADLs    Vocation  Retired    Leisure  walking      Cognition   Overall Cognitive Status  Within Functional Limits for tasks assessed      Observation/Other Assessments   Focus on Therapeutic Outcomes (FOTO)   Lumbar: 53 (47% limited, 43% predicted)      Sensation   Light Touch  Appears Intact      Coordination   Gross Motor Movements are Fluid and Coordinated  Yes      Posture/Postural Control   Posture/Postural Control  Postural limitations    Postural Limitations  Rounded Shoulders;Forward head   L shoulder slightly depressed     ROM / Strength   AROM / PROM / Strength  AROM;Strength      AROM   AROM Assessment Site  Lumbar    Lumbar Flexion  ankles    Lumbar Extension  moderately limited    Lumbar - Right Side Bend  jt line   discomfort   Lumbar - Left Side Bend  distal thigh   dicomfort   Lumbar - Right Rotation  mildly limited    Lumbar - Left Rotation  mildly limited      Strength   Strength Assessment Site  Hip;Knee;Ankle    Right/Left Hip  Right;Left    Right Hip Flexion  4+/5    Right Hip ABduction  4+/5    Right Hip ADduction  4+/5    Left Hip Flexion  4+/5    Left Hip ABduction  4+/5    Left Hip ADduction  4+/5    Right/Left Knee  Right;Left    Right Knee Flexion  5/5  Right Knee Extension  5/5    Left Knee Flexion  5/5    Left Knee Extension  5/5    Right/Left  Ankle  Right;Left    Right Ankle Dorsiflexion  4+/5    Right Ankle Plantar Flexion  4+/5    Left Ankle Dorsiflexion  4+/5    Left Ankle Plantar Flexion  4+/5      Flexibility   Soft Tissue Assessment /Muscle Length  yes    Hamstrings  R HS mildly tight    Quadriceps  B moderately tight      Palpation   Palpation comment  no TTP; atrophy of B lumbar parapsinals; severe convex R lumbar curve      Ambulation/Gait   Gait Pattern  Trunk flexed;Right flexed knee in stance;Left flexed knee in stance;Decreased step length - right;Decreased step length - left                Objective measurements completed on examination: See above findings.              PT Education - 08/09/18 1652    Education Details  prognosis, POC, HEP; edu on importance of WBing exercises for osteopenia and osteoporosis     Person(s) Educated  Patient    Methods  Explanation;Demonstration;Tactile cues;Verbal cues;Handout    Comprehension  Verbalized understanding;Returned demonstration       PT Short Term Goals - 08/09/18 1701      PT SHORT TERM GOAL #1   Title  Patient to be independent with initial HEP.    Time  2    Period  Weeks    Status  New    Target Date  08/23/18        PT Long Term Goals - 08/09/18 1701      PT LONG TERM GOAL #1   Title  Patient to be independent with advanced HEP.    Time  4    Period  Weeks    Status  New    Target Date  09/06/18      PT LONG TERM GOAL #2   Title  Patient to demonstrate [facility] and pain-free lumbar AROM.    Time  4    Period  Weeks    Status  New    Target Date  09/06/18      PT LONG TERM GOAL #3   Title  Patient to demonstrate safe lifting mechanics to lift 10lb box without c/o pain.    Time  4    Period  Weeks    Status  New    Target Date  09/06/18      PT LONG TERM GOAL #4   Title  Patient to report 40mn of walking without pain limiting.     Time  4    Period  Weeks    Status  New    Target Date  09/06/18              Plan - 08/09/18 1653    Clinical Impression Statement  Patient is an 84y/o F presenting to OPPT with c/o acute on chronic exacerbation of LBP, R>L. X-ray revealed DJD, lumbar scoliosis, and L4-5 anterolisthesis. Reports intermittent sharp radiation of pain down front of B LEs down to but not lately. Aggravating factors include lifting, walking, standing, and rainy cold days. Has had benefit from PT in the past and continues to perform HEP given to her by previous PT. Patient today with abnormal posture in sitting, standing,  and with ambulation, decreased lumbar ROM, good overall LE strength, decreased flexibility in HS and quads/hip flexors. Educated on gentle stretching and strengthening HEP as well as on importance of WBing exercise for osteopenia/osteoporosis. Patient reported understanding. Would benefit from skilled PT services 1x/week for 4 weeks to address aforementioned impairments.     Clinical Presentation  Stable    Clinical Decision Making  Low    Rehab Potential  Good    PT Frequency  1x / week    PT Duration  4 weeks    PT Treatment/Interventions  ADLs/Self Care Home Management;Cryotherapy;Electrical Stimulation;Moist Heat;Ultrasound;DME Instruction;Gait training;Stair training;Functional mobility training;Therapeutic activities;Therapeutic exercise;Manual techniques;Patient/family education;Neuromuscular re-education;Balance training;Passive range of motion;Dry needling;Energy conservation;Splinting;Taping    PT Next Visit Plan  reassess HEP    Consulted and Agree with Plan of Care  Patient       Patient will benefit from skilled therapeutic intervention in order to improve the following deficits and impairments:  Decreased activity tolerance, Pain, Decreased strength, Decreased balance, Difficulty walking, Decreased mobility, Improper body mechanics, Decreased range of motion, Impaired flexibility, Postural dysfunction  Visit Diagnosis: Chronic bilateral low back  pain with sciatica, sciatica laterality unspecified  Other symptoms and signs involving the musculoskeletal system  Difficulty in walking, not elsewhere classified  Abnormal posture     Problem List Patient Active Problem List   Diagnosis Date Noted  . Low back pain 01/13/2018  . Muscle cramps 01/13/2018  . Preventative health care 11/18/2016  . Hyperlipidemia 11/17/2016  . Atherosclerosis of aorta ([place]) 11/17/2016  . Palpitations 11/17/2016  . Retinal tear of right eye 11/17/2016  . Macular degeneration 11/17/2016  . Skin lesion of face 01/09/2016  . Vitamin B12 deficiency 01/09/2016  . Vitamin D deficiency 07/11/2015  . Renal insufficiency 03/29/2015  . Anemia 03/29/2015  . Hyperglycemia 01/30/2015  . Osteoporosis 09/27/2013  . Insomnia 09/27/2013  . Allergic rhinitis 12/26/2012  . GERD (gastroesophageal reflux disease) 04/21/2011  . Thyroid disease   . Arthritis   . Urinary tract bacterial infections   . Atrophic vaginitis   . Spinal stenosis   . Fibrocystic breast disease   . Lupus (systemic lupus erythematosus) ([place])   . Hearing loss sensory, bilateral     Janene Harvey, PT, DPT 08/09/18 5:04 PM   [facility] [address]  [place], [place], [address] Phone: [phone]   Fax:  [phone]  Name: Molly Williams MRN: [id number] Date of Birth: [birth year]  PHYSICAL THERAPY DISCHARGE SUMMARY  Visits from Start of Care: 1  Current functional level related to goals / functional outcomes: See above clinical impression; patient reporting that she no longer needs PT   Remaining deficits: See above   Education / Equipment: HEP  Plan: Patient agrees to discharge.  Patient goals were not met. Patient is being discharged due to the patient's request.  ?????     Janene Harvey, PT, DPT 09/26/18 1:36 PM

## 2018-08-15 ENCOUNTER — Ambulatory Visit: Payer: PPO | Admitting: Physical Therapy

## 2018-08-15 DIAGNOSIS — D225 Melanocytic nevi of trunk: Secondary | ICD-10-CM | POA: Diagnosis not present

## 2018-08-15 DIAGNOSIS — L821 Other seborrheic keratosis: Secondary | ICD-10-CM | POA: Diagnosis not present

## 2018-08-15 DIAGNOSIS — Z85828 Personal history of other malignant neoplasm of skin: Secondary | ICD-10-CM | POA: Diagnosis not present

## 2018-08-15 DIAGNOSIS — L814 Other melanin hyperpigmentation: Secondary | ICD-10-CM | POA: Diagnosis not present

## 2018-08-15 DIAGNOSIS — Z23 Encounter for immunization: Secondary | ICD-10-CM | POA: Diagnosis not present

## 2018-08-15 DIAGNOSIS — L309 Dermatitis, unspecified: Secondary | ICD-10-CM | POA: Diagnosis not present

## 2018-08-19 ENCOUNTER — Encounter: Payer: PPO | Admitting: Physical Therapy

## 2018-08-22 ENCOUNTER — Encounter: Payer: PPO | Admitting: Physical Therapy

## 2018-08-25 ENCOUNTER — Other Ambulatory Visit (INDEPENDENT_AMBULATORY_CARE_PROVIDER_SITE_OTHER): Payer: PPO

## 2018-08-25 ENCOUNTER — Encounter: Payer: PPO | Admitting: Physical Therapy

## 2018-08-25 DIAGNOSIS — E538 Deficiency of other specified B group vitamins: Secondary | ICD-10-CM

## 2018-08-25 LAB — VITAMIN B12: VITAMIN B 12: 402 pg/mL (ref 211–911)

## 2018-08-28 LAB — INTRINSIC FACTOR ANTIBODIES: Intrinsic Factor: NEGATIVE

## 2018-08-29 ENCOUNTER — Encounter: Payer: PPO | Admitting: Physical Therapy

## 2018-09-06 ENCOUNTER — Encounter: Payer: PPO | Admitting: Physical Therapy

## 2018-09-28 ENCOUNTER — Telehealth: Payer: Self-pay

## 2018-09-28 NOTE — Telephone Encounter (Signed)
Prolia--benefits initiated.

## 2018-09-29 ENCOUNTER — Ambulatory Visit: Payer: PPO | Admitting: *Deleted

## 2018-10-03 NOTE — Telephone Encounter (Signed)
Policy terminated 39/11/90- unable to verify benefits.

## 2018-11-17 DIAGNOSIS — H903 Sensorineural hearing loss, bilateral: Secondary | ICD-10-CM | POA: Diagnosis not present

## 2018-11-17 DIAGNOSIS — H6123 Impacted cerumen, bilateral: Secondary | ICD-10-CM | POA: Diagnosis not present

## 2018-11-25 ENCOUNTER — Encounter: Payer: Self-pay | Admitting: Family Medicine

## 2018-11-28 ENCOUNTER — Other Ambulatory Visit: Payer: Self-pay | Admitting: Family Medicine

## 2018-11-28 DIAGNOSIS — H903 Sensorineural hearing loss, bilateral: Secondary | ICD-10-CM

## 2018-12-12 ENCOUNTER — Other Ambulatory Visit: Payer: Self-pay | Admitting: Family Medicine

## 2019-01-03 ENCOUNTER — Other Ambulatory Visit: Payer: Self-pay | Admitting: Family Medicine

## 2019-02-23 ENCOUNTER — Other Ambulatory Visit: Payer: Self-pay | Admitting: Family Medicine

## 2019-02-23 ENCOUNTER — Encounter: Payer: Self-pay | Admitting: Family Medicine

## 2019-02-23 DIAGNOSIS — M810 Age-related osteoporosis without current pathological fracture: Secondary | ICD-10-CM

## 2019-02-23 DIAGNOSIS — M329 Systemic lupus erythematosus, unspecified: Secondary | ICD-10-CM

## 2019-02-23 DIAGNOSIS — M48061 Spinal stenosis, lumbar region without neurogenic claudication: Secondary | ICD-10-CM

## 2019-03-30 DIAGNOSIS — Z961 Presence of intraocular lens: Secondary | ICD-10-CM | POA: Diagnosis not present

## 2019-03-30 DIAGNOSIS — H353132 Nonexudative age-related macular degeneration, bilateral, intermediate dry stage: Secondary | ICD-10-CM | POA: Diagnosis not present

## 2019-03-30 DIAGNOSIS — H01002 Unspecified blepharitis right lower eyelid: Secondary | ICD-10-CM | POA: Diagnosis not present

## 2019-03-30 DIAGNOSIS — H01001 Unspecified blepharitis right upper eyelid: Secondary | ICD-10-CM | POA: Diagnosis not present

## 2019-04-03 DIAGNOSIS — M810 Age-related osteoporosis without current pathological fracture: Secondary | ICD-10-CM | POA: Diagnosis not present

## 2019-04-03 DIAGNOSIS — R768 Other specified abnormal immunological findings in serum: Secondary | ICD-10-CM | POA: Diagnosis not present

## 2019-04-03 DIAGNOSIS — M48061 Spinal stenosis, lumbar region without neurogenic claudication: Secondary | ICD-10-CM | POA: Diagnosis not present

## 2019-05-27 ENCOUNTER — Other Ambulatory Visit: Payer: Self-pay | Admitting: Family Medicine

## 2019-05-29 ENCOUNTER — Encounter: Payer: Self-pay | Admitting: Family Medicine

## 2019-05-29 MED ORDER — OMEPRAZOLE 20 MG PO CPDR: 20.0000 mg | DELAYED_RELEASE_CAPSULE | Freq: Every day | ORAL | 0 refills | Status: DC

## 2019-05-29 MED ORDER — LEVOTHYROXINE SODIUM 25 MCG PO TABS: ORAL_TABLET | ORAL | 0 refills | Status: DC

## 2019-05-30 ENCOUNTER — Encounter: Payer: Self-pay | Admitting: Family Medicine

## 2019-06-15 ENCOUNTER — Other Ambulatory Visit: Payer: Self-pay

## 2019-06-15 ENCOUNTER — Ambulatory Visit (INDEPENDENT_AMBULATORY_CARE_PROVIDER_SITE_OTHER): Payer: Medicare Other

## 2019-06-15 DIAGNOSIS — Z23 Encounter for immunization: Secondary | ICD-10-CM

## 2019-06-16 ENCOUNTER — Other Ambulatory Visit: Payer: Self-pay | Admitting: Family Medicine

## 2019-06-16 DIAGNOSIS — H9209 Otalgia, unspecified ear: Secondary | ICD-10-CM

## 2019-06-16 NOTE — Progress Notes (Unsigned)
amb ref °

## 2019-06-26 ENCOUNTER — Other Ambulatory Visit: Payer: Self-pay

## 2019-06-26 ENCOUNTER — Ambulatory Visit (INDEPENDENT_AMBULATORY_CARE_PROVIDER_SITE_OTHER): Payer: Medicare Other | Admitting: Family Medicine

## 2019-06-26 ENCOUNTER — Encounter: Payer: Self-pay | Admitting: Family Medicine

## 2019-06-26 VITALS — BP 108/64 | HR 64 | Temp 98.1°F | Resp 16 | Ht 62.0 in | Wt 114.5 lb

## 2019-06-26 DIAGNOSIS — R252 Cramp and spasm: Secondary | ICD-10-CM

## 2019-06-26 DIAGNOSIS — E782 Mixed hyperlipidemia: Secondary | ICD-10-CM | POA: Diagnosis not present

## 2019-06-26 DIAGNOSIS — Z0000 Encounter for general adult medical examination without abnormal findings: Secondary | ICD-10-CM

## 2019-06-26 DIAGNOSIS — E559 Vitamin D deficiency, unspecified: Secondary | ICD-10-CM | POA: Diagnosis not present

## 2019-06-26 DIAGNOSIS — E079 Disorder of thyroid, unspecified: Secondary | ICD-10-CM | POA: Diagnosis not present

## 2019-06-26 DIAGNOSIS — E538 Deficiency of other specified B group vitamins: Secondary | ICD-10-CM | POA: Diagnosis not present

## 2019-06-26 DIAGNOSIS — M810 Age-related osteoporosis without current pathological fracture: Secondary | ICD-10-CM

## 2019-06-26 DIAGNOSIS — R739 Hyperglycemia, unspecified: Secondary | ICD-10-CM | POA: Diagnosis not present

## 2019-06-26 DIAGNOSIS — G43909 Migraine, unspecified, not intractable, without status migrainosus: Secondary | ICD-10-CM | POA: Diagnosis not present

## 2019-06-26 DIAGNOSIS — N289 Disorder of kidney and ureter, unspecified: Secondary | ICD-10-CM

## 2019-06-26 LAB — COMPREHENSIVE METABOLIC PANEL
ALT: 11 U/L (ref 0–35)
AST: 18 U/L (ref 0–37)
Albumin: 4.5 g/dL (ref 3.5–5.2)
Alkaline Phosphatase: 89 U/L (ref 39–117)
BUN: 16 mg/dL (ref 6–23)
CO2: 26 mEq/L (ref 19–32)
Calcium: 9.9 mg/dL (ref 8.4–10.5)
Chloride: 96 mEq/L (ref 96–112)
Creatinine, Ser: 0.94 mg/dL (ref 0.40–1.20)
GFR: 56.42 mL/min — ABNORMAL LOW (ref >60.00)
Glucose, Bld: 85 mg/dL (ref 70–99)
Potassium: 5 mEq/L (ref 3.5–5.1)
Sodium: 132 mEq/L — ABNORMAL LOW (ref 135–145)
Total Bilirubin: 0.5 mg/dL (ref 0.2–1.2)
Total Protein: 7.6 g/dL (ref 6.0–8.3)

## 2019-06-26 LAB — VITAMIN D 25 HYDROXY (VIT D DEFICIENCY, FRACTURES): VITD: 42.69 ng/mL (ref 30.00–100.00)

## 2019-06-26 LAB — CBC
HCT: 37.3 % (ref 36.0–46.0)
Hemoglobin: 12.7 g/dL (ref 12.0–15.0)
MCHC: 33.9 g/dL (ref 30.0–36.0)
MCV: 97.2 fl (ref 78.0–100.0)
Platelets: 335 10*3/uL (ref 150.0–400.0)
RBC: 3.84 Mil/uL — ABNORMAL LOW (ref 3.87–5.11)
RDW: 13.9 % (ref 11.5–15.5)
WBC: 7.8 10*3/uL (ref 4.0–10.5)

## 2019-06-26 LAB — LIPID PANEL
Cholesterol: 249 mg/dL — ABNORMAL HIGH (ref 0–200)
HDL: 106.3 mg/dL (ref >39.00)
LDL Cholesterol: 121 mg/dL — ABNORMAL HIGH (ref 0–99)
NonHDL: 142.35
Total CHOL/HDL Ratio: 2
Triglycerides: 106 mg/dL (ref 0.0–149.0)
VLDL: 21.2 mg/dL (ref 0.0–40.0)

## 2019-06-26 LAB — VITAMIN B12: Vitamin B-12: 603 pg/mL (ref 211–911)

## 2019-06-26 LAB — HEMOGLOBIN A1C: Hgb A1c MFr Bld: 5.5 % (ref 4.6–6.5)

## 2019-06-26 LAB — TSH: TSH: 3.43 u[IU]/mL (ref 0.35–4.50)

## 2019-06-26 NOTE — Assessment & Plan Note (Signed)
Supplement and monitor 

## 2019-06-26 NOTE — Assessment & Plan Note (Signed)
Encouraged heart healthy diet, increase exercise, avoid trans fats, consider a krill oil cap daily 

## 2019-06-26 NOTE — Assessment & Plan Note (Signed)
Patient encouraged to maintain heart healthy diet, regular exercise, adequate sleep. Consider daily probiotics. Take medications as prescribed. Labs ordered and reviewed. Set up Prolia shot for 2 weeks

## 2019-06-26 NOTE — Assessment & Plan Note (Signed)
Complicated, ocular migraines infrequently for over 15 years. Had one this morning but dissipates in about 20 minutes Took one Excedrin Migraine and it is much better. They typically last 20 minutes and sometimes she has word finding trouble with them but not this am. Has been to a neurologist in past and MRI showed no concerns.

## 2019-06-26 NOTE — Assessment & Plan Note (Signed)
Hydrate and monitor 

## 2019-06-26 NOTE — Patient Instructions (Signed)
Pulse oximeter for home want oxygen levels in the 90s, [employer] and [employer]   Weekly vitals   Cholesterol Content in Foods Cholesterol is a waxy, fat-like substance that helps to carry fat in the blood. The body needs cholesterol in small amounts, but too much cholesterol can cause damage to the arteries and heart. Most people should eat less than 200 milligrams (mg) of cholesterol a day. Foods with cholesterol  Cholesterol is found in animal-based foods, such as meat, seafood, and dairy. Generally, low-fat dairy and lean meats have less cholesterol than full-fat dairy and fatty meats. The milligrams of cholesterol per serving (mg per serving) of common cholesterol-containing foods are listed below. Meat and other proteins  Egg - one large whole egg has 186 mg.  Veal shank - 4 oz has 141 mg.  Lean ground [place] (93% lean) - 4 oz has 118 mg.  Fat-trimmed lamb loin - 4 oz has 106 mg.  Lean ground beef (90% lean) - 4 oz has 100 mg.  Lobster - 3.5 oz has 90 mg.  Pork loin chops - 4 oz has 86 mg.  Canned salmon - 3.5 oz has 83 mg.  Fat-trimmed beef top loin - 4 oz has 78 mg.  Frankfurter - 1 frank (3.5 oz) has 77 mg.  Crab - 3.5 oz has 71 mg.  Roasted chicken without skin, white meat - 4 oz has 66 mg.  Light bologna - 2 oz has 45 mg.  Deli-cut [place] - 2 oz has 31 mg.  Canned tuna - 3.5 oz has 31 mg.  Bacon - 1 oz has 29 mg.  Oysters and mussels (raw) - 3.5 oz has 25 mg.  Mackerel - 1 oz has 22 mg.  Trout - 1 oz has 20 mg.  Pork sausage - 1 link (1 oz) has 17 mg.  Salmon - 1 oz has 16 mg.  Tilapia - 1 oz has 14 mg. Dairy  Soft-serve ice cream -  cup (4 oz) has 103 mg.  Whole-milk yogurt - 1 cup (8 oz) has 29 mg.  Cheddar cheese - 1 oz has 28 mg.  American cheese - 1 oz has 28 mg.  Whole milk - 1 cup (8 oz) has 23 mg.  2% milk - 1 cup (8 oz) has 18 mg.  Cream cheese - 1 tablespoon (Tbsp) has 15 mg.  Cottage cheese -  cup (4 oz) has 14 mg.  Low-fat (1%)  milk - 1 cup (8 oz) has 10 mg.  Sour cream - 1 Tbsp has 8.5 mg.  Low-fat yogurt - 1 cup (8 oz) has 8 mg.  Nonfat Greek yogurt - 1 cup (8 oz) has 7 mg.  Half-and-half cream - 1 Tbsp has 5 mg. Fats and oils  Cod liver oil - 1 tablespoon (Tbsp) has 82 mg.  Butter - 1 Tbsp has 15 mg.  Lard - 1 Tbsp has 14 mg.  Bacon grease - 1 Tbsp has 14 mg.  Mayonnaise - 1 Tbsp has 5-10 mg.  Margarine - 1 Tbsp has 3-10 mg. Exact amounts of cholesterol in these foods may vary depending on specific ingredients and brands. Foods without cholesterol Most plant-based foods do not have cholesterol unless you combine them with a food that has cholesterol. Foods without cholesterol include:  Grains and cereals.  Vegetables.  Fruits.  Vegetable oils, such as olive, canola, and sunflower oil.  Legumes, such as peas, beans, and lentils.  Nuts and seeds.  Egg whites. Summary  The  body needs cholesterol in small amounts, but too much cholesterol can cause damage to the arteries and heart.  Most people should eat less than 200 milligrams (mg) of cholesterol a day. This information is not intended to replace advice given to you by your health care provider. Make sure you discuss any questions you have with your health care provider. Document Released: 04/27/2017 Document Revised: 08/13/2017 Document Reviewed: 04/27/2017 Elsevier Patient Education  2020 [employer].

## 2019-06-26 NOTE — Assessment & Plan Note (Signed)
Encouraged to get adequate exercise, calcium and vitamin d intake. Check labs and PA started for Prolia

## 2019-06-26 NOTE — Assessment & Plan Note (Signed)
hgba1c acceptable, minimize simple carbs  And exercise as tolerated. Continue current meds

## 2019-06-26 NOTE — Assessment & Plan Note (Signed)
On Levothyroxine, continue to monitor 

## 2019-06-26 NOTE — Progress Notes (Signed)
Subjective:    Patient ID: Molly Williams, female    DOB: 1931-06-14, 85 y.o.   MRN: [id number]  Chief Complaint  Patient presents with  . Preventative health care    Pt has not been seen x1 year. She is doing well with no complaints. Not Fasting,     HPI Patient is in today for preventative exam and follow up on chronic medical concerns including osteoporosis, hearing loss, hyperlipidemia and more. She feels well today. No recent febrile illness or hospitalizations. She is hydrating well and eating well. Family is helping her maintain quarantine well. Denies CP/palp/SOB/HA/congestion/fevers/GI or GU c/o. Taking meds as prescribed  Past Medical History:  Diagnosis Date  . Anemia 03/29/2015  . Arthritis   . Atrophic vaginitis   . Cholelithiasis 2008  . Fibrocystic breast disease    bx 2008 neg per pt report  . GERD (gastroesophageal reflux disease)   . Hearing loss   . Hearing loss sensory, bilateral    bil/hearing aids  . Hematuria    chronic Dr. Burnell Blanks  . Hyperlipidemia 11/17/2016  . Lupus (systemic lupus erythematosus) (HCC)    Dr. Charlestine Night  . Macular degeneration 11/17/2016  . Migraine aura without headache    h/o ocular migraines  . Osteopenia   . Preventative health care 11/18/2016  . Renal insufficiency 03/29/2015  . Retinal detachment with break    right/laser  . Retinal tear of right eye 11/17/2016  . Skin lesion of face 01/09/2016  . Spinal stenosis   . Thyroid disease    hypothyroidism  . Urinary tract bacterial infections    has had urethral dilations in past  . Vitamin B12 deficiency 01/09/2016  . Vitamin D deficiency 07/11/2015    Past Surgical History:  Procedure Laterality Date  . APPENDECTOMY  2006  . CATARACT EXTRACTION W/ INTRAOCULAR LENS  IMPLANT, BILATERAL    . CHOLECYSTECTOMY    . EYE SURGERY Bilateral    cataracts  . TONSILLECTOMY  1940  . VAGINAL HYSTERECTOMY     partial    Family History  Problem Relation Age of Onset  . Stroke Paternal  Grandmother   . Stroke Father   . Coronary artery disease Father   . Coronary artery disease Mother   . Stroke Mother   . Cancer Paternal Aunt        GYN cancer  . Cancer Paternal Uncle        lung, smoker  . Stroke Maternal Grandmother   . Stroke Maternal Grandfather   . Heart disease Paternal Grandfather   . Hydrocephalus Brother        normopressure hydrocephalus, dementia    Social History   Socioeconomic History  . Marital status: Widowed    Spouse name: Not on file  . Number of children: Not on file  . Years of education: Not on file  . Highest education level: Not on file  Occupational History  . Not on file  Social Needs  . Financial resource strain: Not on file  . Food insecurity    Worry: Not on file    Inability: Not on file  . Transportation needs    Medical: Not on file    Non-medical: Not on file  Tobacco Use  . Smoking status: Former Research scientist (life sciences)  . Smokeless tobacco: Never Used  Substance and Sexual Activity  . Alcohol use: Yes    Alcohol/week: 10.0 standard drinks    Types: 10 Glasses of wine per week  . Drug use:  No  . Sexual activity: Never    Comment: lives alone here because her daughter lives hier, no dietary restrictions, avoids dairy  Lifestyle  . Physical activity    Days per week: Not on file    Minutes per session: Not on file  . Stress: Not on file  Relationships  . Social Herbalist on phone: Not on file    Gets together: Not on file    Attends religious service: Not on file    Active member of club or organization: Not on file    Attends meetings of clubs or organizations: Not on file    Relationship status: Not on file  . Intimate partner violence    Fear of current or ex partner: Not on file    Emotionally abused: Not on file    Physically abused: Not on file    Forced sexual activity: Not on file  Other Topics Concern  . Not on file  Social History Narrative  . Not on file    Outpatient Medications Prior to Visit   Medication Sig Dispense Refill  . acetaminophen (TYLENOL) 500 MG tablet Take 1,000 mg by mouth every 6 (six) hours as needed. pain     . aspirin-acetaminophen-caffeine (EXCEDRIN MIGRAINE) 250-250-65 MG tablet Take by mouth every 6 (six) hours as needed for headache.    . Cholecalciferol (VITAMIN D) 2000 units CAPS Take 2,000 Units by mouth daily.    . Cyanocobalamin (VITAMIN B 12 PO) Take 1,000 mg by mouth daily.    . diphenhydrAMINE (BENADRYL) 25 mg capsule Take 25 mg by mouth every 6 (six) hours as needed.    . famotidine (PEPCID AC) 10 MG chewable tablet Chew 10 mg by mouth daily as needed for heartburn.    . levothyroxine (SYNTHROID) 25 MCG tablet TAKE 2 TABLETS BY MOUTH ON TUESDAY AND SATURDAY. ALL OTHER DAYS TAKE 1 TABLET BY MOUTH DAILY 180 tablet 0  . omeprazole (PRILOSEC) 20 MG capsule Take 1 capsule (20 mg total) by mouth daily. 90 capsule 0  . Probiotic Product (PROBIOTIC DAILY PO) Take by mouth.     No facility-administered medications prior to visit.     Allergies  Allergen Reactions  . Codeine Nausea And Vomiting and Nausea Only  . Iodine Hives    Contrast dye  . Alendronate   . Bacitracin Swelling    Ophthalmic ointment  . Iodinated Diagnostic Agents   . Bactrim  [Sulfamethoxazole-Trimethoprim] Hives, Itching and Rash    Review of Systems  Constitutional: Negative for fever and malaise/fatigue.  HENT: Negative for congestion.   Eyes: Negative for blurred vision.  Respiratory: Negative for shortness of breath.   Cardiovascular: Negative for chest pain, palpitations and leg swelling.  Gastrointestinal: Negative for abdominal pain, blood in stool and nausea.  Genitourinary: Negative for dysuria and frequency.  Musculoskeletal: Negative for falls.  Skin: Negative for rash.  Neurological: Negative for dizziness, loss of consciousness and headaches.  Endo/Heme/Allergies: Negative for environmental allergies.  Psychiatric/Behavioral: Negative for depression. The patient  is not nervous/anxious.        Objective:    Physical Exam Vitals signs and nursing note reviewed.  Constitutional:      General: She is not in acute distress.    Appearance: She is well-developed.  HENT:     Head: Normocephalic and atraumatic.     Nose: Nose normal.  Eyes:     General:        Right eye: No discharge.  Left eye: No discharge.     Conjunctiva/sclera: Conjunctivae normal.  Neck:     Musculoskeletal: Normal range of motion and neck supple.     Thyroid: No thyromegaly.  Cardiovascular:     Rate and Rhythm: Normal rate and regular rhythm.     Heart sounds: Normal heart sounds. No murmur.  Pulmonary:     Effort: Pulmonary effort is normal. No respiratory distress.     Breath sounds: Normal breath sounds.  Abdominal:     General: Bowel sounds are normal. There is no distension.     Palpations: Abdomen is soft. There is no mass.     Tenderness: There is no abdominal tenderness.  Lymphadenopathy:     Cervical: No cervical adenopathy.  Skin:    General: Skin is warm and dry.  Neurological:     Mental Status: She is alert and oriented to person, place, and time.  Psychiatric:        Behavior: Behavior normal.     BP 108/64 (BP Location: Right Arm, Patient Position: Sitting, Cuff Size: Normal)   Pulse 64   Temp 98.1 F (36.7 C) (Temporal)   Resp 16   Ht 5\' 2"  (1.575 m)   Wt 114 lb 8 oz (51.9 kg)   SpO2 97%   BMI 20.94 kg/m  Wt Readings from Last 3 Encounters:  06/26/19 114 lb 8 oz (51.9 kg)  07/22/18 114 lb 6.4 oz (51.9 kg)  01/13/18 112 lb 12.8 oz (51.2 kg)    Diabetic Foot Exam - Simple   No data filed     Lab Results  Component Value Date   WBC 6.3 07/22/2018   HGB 12.6 07/22/2018   HCT 37.2 07/22/2018   PLT 336.0 07/22/2018   GLUCOSE 96 07/22/2018   CHOL 227 (H) 07/22/2018   TRIG 81.0 07/22/2018   HDL 102.10 07/22/2018   LDLCALC 109 (H) 07/22/2018   ALT 11 07/22/2018   AST 17 07/22/2018   NA 135 07/22/2018   K 4.9 07/22/2018    CL 102 07/22/2018   CREATININE 0.93 07/22/2018   BUN 18 07/22/2018   CO2 27 07/22/2018   TSH 3.18 07/22/2018   INR 0.94 09/22/2014   HGBA1C 5.3 07/22/2018    Lab Results  Component Value Date   TSH 3.18 07/22/2018   Lab Results  Component Value Date   WBC 6.3 07/22/2018   HGB 12.6 07/22/2018   HCT 37.2 07/22/2018   MCV 97.4 07/22/2018   PLT 336.0 07/22/2018   Lab Results  Component Value Date   NA 135 07/22/2018   K 4.9 07/22/2018   CO2 27 07/22/2018   GLUCOSE 96 07/22/2018   BUN 18 07/22/2018   CREATININE 0.93 07/22/2018   BILITOT 0.5 07/22/2018   ALKPHOS 43 07/22/2018   AST 17 07/22/2018   ALT 11 07/22/2018   PROT 7.6 07/22/2018   ALBUMIN 4.5 07/22/2018   CALCIUM 9.6 07/22/2018   ANIONGAP 8 09/22/2014   GFR 60.85 07/22/2018   Lab Results  Component Value Date   CHOL 227 (H) 07/22/2018   Lab Results  Component Value Date   HDL 102.10 07/22/2018   Lab Results  Component Value Date   LDLCALC 109 (H) 07/22/2018   Lab Results  Component Value Date   TRIG 81.0 07/22/2018   Lab Results  Component Value Date   CHOLHDL 2 07/22/2018   Lab Results  Component Value Date   HGBA1C 5.3 07/22/2018       Assessment &  Plan:   Problem List Items Addressed This Visit    Thyroid disease    On Levothyroxine, continue to monitor      Relevant Orders   TSH   Hyperglycemia    hgba1c acceptable, minimize simple carbs  And exercise as tolerated. Continue current meds      Relevant Orders   Comprehensive metabolic panel   Hemoglobin A1c   Renal insufficiency    Hydrate and monitor      Vitamin D deficiency    Supplement and monitor      Relevant Orders   VITAMIN D 25 Hydroxy (Vit-D Deficiency, Fractures)   Vitamin B12 deficiency    Supplement and monitor      Relevant Orders   Vitamin B12   Hyperlipidemia    Encouraged heart healthy diet, increase exercise, avoid trans fats, consider a krill oil cap daily      Relevant Orders   Lipid panel    Muscle cramps    Hydrate and monitor      Migraine    Complicated, ocular migraines infrequently for over 15 years. Had one this morning but dissipates in about 20 minutes Took one Excedrin Migraine and it is much better. They typically last 20 minutes and sometimes she has word finding trouble with them but not this am. Has been to a neurologist in past and MRI showed no concerns.       Relevant Medications   aspirin-acetaminophen-caffeine (EXCEDRIN MIGRAINE) 250-250-65 MG tablet   Other Relevant Orders   CBC      I am having Jamyah C. Morreale maintain her acetaminophen, Probiotic Product (PROBIOTIC DAILY PO), diphenhydrAMINE, Vitamin D, Cyanocobalamin (VITAMIN B 12 PO), famotidine, levothyroxine, omeprazole, and aspirin-acetaminophen-caffeine.  No orders of the defined types were placed in this encounter.    Penni Homans, MD

## 2019-06-30 DIAGNOSIS — H01001 Unspecified blepharitis right upper eyelid: Secondary | ICD-10-CM | POA: Diagnosis not present

## 2019-06-30 DIAGNOSIS — H01002 Unspecified blepharitis right lower eyelid: Secondary | ICD-10-CM | POA: Diagnosis not present

## 2019-06-30 DIAGNOSIS — Z961 Presence of intraocular lens: Secondary | ICD-10-CM | POA: Diagnosis not present

## 2019-06-30 DIAGNOSIS — H353132 Nonexudative age-related macular degeneration, bilateral, intermediate dry stage: Secondary | ICD-10-CM | POA: Diagnosis not present

## 2019-07-13 ENCOUNTER — Ambulatory Visit: Payer: Medicare Other

## 2019-08-06 ENCOUNTER — Encounter: Payer: Self-pay | Admitting: Family Medicine

## 2019-08-17 ENCOUNTER — Other Ambulatory Visit: Payer: Self-pay

## 2019-08-17 ENCOUNTER — Ambulatory Visit (INDEPENDENT_AMBULATORY_CARE_PROVIDER_SITE_OTHER): Payer: Medicare Other

## 2019-08-17 DIAGNOSIS — M810 Age-related osteoporosis without current pathological fracture: Secondary | ICD-10-CM | POA: Diagnosis not present

## 2019-08-17 MED ORDER — DENOSUMAB 60 MG/ML ~~LOC~~ SOSY
60.0000 mg | PREFILLED_SYRINGE | Freq: Once | SUBCUTANEOUS | Status: AC
  Administered 2019-08-17: 60 mg via SUBCUTANEOUS

## 2019-08-21 NOTE — Progress Notes (Signed)
Nursing note reviewed. Agree with documention and plan.  

## 2019-08-23 DIAGNOSIS — H903 Sensorineural hearing loss, bilateral: Secondary | ICD-10-CM | POA: Diagnosis not present

## 2019-08-29 ENCOUNTER — Other Ambulatory Visit: Payer: Self-pay | Admitting: Family Medicine

## 2019-09-02 ENCOUNTER — Other Ambulatory Visit: Payer: Self-pay | Admitting: Family Medicine

## 2019-09-05 ENCOUNTER — Other Ambulatory Visit (HOSPITAL_BASED_OUTPATIENT_CLINIC_OR_DEPARTMENT_OTHER): Payer: Self-pay | Admitting: Family Medicine

## 2019-09-05 DIAGNOSIS — Z1231 Encounter for screening mammogram for malignant neoplasm of breast: Secondary | ICD-10-CM

## 2019-09-06 ENCOUNTER — Telehealth: Payer: Self-pay | Admitting: Family Medicine

## 2019-09-06 MED ORDER — LEVOTHYROXINE SODIUM 25 MCG PO TABS: ORAL_TABLET | ORAL | 1 refills | Status: DC

## 2019-09-06 MED ORDER — LEVOTHYROXINE SODIUM 25 MCG PO TABS: 25.0000 ug | ORAL_TABLET | Freq: Every day | ORAL | 1 refills | Status: DC

## 2019-09-06 NOTE — Telephone Encounter (Signed)
Sent medication in

## 2019-09-06 NOTE — Telephone Encounter (Signed)
Pharmacy called in stating they are needing this medication changed to the uthyrox instead of levothyroxine. Please advise.

## 2019-09-06 NOTE — Addendum Note (Signed)
Addended by: Magdalene Molly A on: 09/06/2019 10:47 AM   Modules accepted: Orders

## 2019-09-07 MED ORDER — OMEPRAZOLE 20 MG PO CPDR: 20.0000 mg | DELAYED_RELEASE_CAPSULE | Freq: Every day | ORAL | Status: DC

## 2019-09-07 NOTE — Addendum Note (Signed)
Addended by: Magdalene Molly A on: 09/07/2019 08:21 AM   Modules accepted: Orders

## 2019-09-11 ENCOUNTER — Ambulatory Visit (HOSPITAL_BASED_OUTPATIENT_CLINIC_OR_DEPARTMENT_OTHER)
Admission: RE | Admit: 2019-09-11 | Discharge: 2019-09-11 | Disposition: A | Discharge status: Home / Self Care | Payer: Medicare Other | Source: Ambulatory Visit | Attending: Family Medicine | Admitting: Family Medicine

## 2019-09-11 ENCOUNTER — Other Ambulatory Visit: Payer: Self-pay

## 2019-09-11 DIAGNOSIS — Z1231 Encounter for screening mammogram for malignant neoplasm of breast: Secondary | ICD-10-CM | POA: Diagnosis not present

## 2019-09-28 DIAGNOSIS — L821 Other seborrheic keratosis: Secondary | ICD-10-CM | POA: Diagnosis not present

## 2019-09-28 DIAGNOSIS — L570 Actinic keratosis: Secondary | ICD-10-CM | POA: Diagnosis not present

## 2019-09-28 DIAGNOSIS — Z85828 Personal history of other malignant neoplasm of skin: Secondary | ICD-10-CM | POA: Diagnosis not present

## 2019-09-28 DIAGNOSIS — Z23 Encounter for immunization: Secondary | ICD-10-CM | POA: Diagnosis not present

## 2019-09-28 DIAGNOSIS — D225 Melanocytic nevi of trunk: Secondary | ICD-10-CM | POA: Diagnosis not present

## 2019-09-28 DIAGNOSIS — L814 Other melanin hyperpigmentation: Secondary | ICD-10-CM | POA: Diagnosis not present

## 2019-10-08 ENCOUNTER — Encounter: Payer: Self-pay | Admitting: Family Medicine

## 2019-10-09 ENCOUNTER — Other Ambulatory Visit: Payer: Self-pay | Admitting: Family Medicine

## 2019-10-09 MED ORDER — METHYLPREDNISOLONE 4 MG PO TABS: ORAL_TABLET | ORAL | 0 refills | Status: DC

## 2019-10-10 ENCOUNTER — Telehealth: Payer: Self-pay | Admitting: Family Medicine

## 2019-10-10 ENCOUNTER — Other Ambulatory Visit: Payer: Self-pay | Admitting: *Deleted

## 2019-10-10 MED ORDER — METHYLPREDNISOLONE 4 MG PO TABS: ORAL_TABLET | ORAL | 0 refills | Status: DC

## 2019-10-10 NOTE — Telephone Encounter (Signed)
It looks like it has a sig, please call pharmacy and confirm I want her to have a slow taper as directed. Take each dose for 3 days and then drop to next dose

## 2019-10-10 NOTE — Telephone Encounter (Signed)
Please advise the sig is not correct

## 2019-10-10 NOTE — Telephone Encounter (Signed)
methylPREDNISolone (MEDROL) 4 MG tablet 15 tablet 0 10/09/2019   5 tab po qd X 3 d then 4 tab po qd X 3 d then 3 tab po qd X 3 d then 2 tab po qd x 3 d then 1 tab po qd x 3 days   Pt called in stating that Pharmacy states the office didn't give instructions on how to take this medication. Pharmacy will need that in order to fill  RX for patient.

## 2019-10-11 NOTE — Telephone Encounter (Signed)
Pharmacy had initially faxed over stating that the rx was only written for 15tabs.  For the directions it was supposed to have been 45 tabs.  Rx was fixed yesterday.  Spoke with patient and she stated that she did pick up medication already.

## 2019-11-02 ENCOUNTER — Encounter: Payer: Self-pay | Admitting: Family Medicine

## 2019-11-21 ENCOUNTER — Encounter: Payer: Self-pay | Admitting: Family Medicine

## 2019-12-25 ENCOUNTER — Ambulatory Visit: Payer: Medicare Other | Admitting: Family Medicine

## 2019-12-27 DIAGNOSIS — L820 Inflamed seborrheic keratosis: Secondary | ICD-10-CM | POA: Diagnosis not present

## 2019-12-27 DIAGNOSIS — L819 Disorder of pigmentation, unspecified: Secondary | ICD-10-CM | POA: Diagnosis not present

## 2020-01-01 ENCOUNTER — Other Ambulatory Visit: Payer: Self-pay

## 2020-01-01 ENCOUNTER — Ambulatory Visit: Payer: Medicare Other | Admitting: Family Medicine

## 2020-01-02 ENCOUNTER — Ambulatory Visit (INDEPENDENT_AMBULATORY_CARE_PROVIDER_SITE_OTHER): Payer: Medicare Other | Admitting: Family Medicine

## 2020-01-02 VITALS — BP 124/41 | HR 67 | Temp 98.1°F | Resp 12 | Ht 62.0 in | Wt 114.8 lb

## 2020-01-02 DIAGNOSIS — R739 Hyperglycemia, unspecified: Secondary | ICD-10-CM

## 2020-01-02 DIAGNOSIS — E559 Vitamin D deficiency, unspecified: Secondary | ICD-10-CM

## 2020-01-02 DIAGNOSIS — E538 Deficiency of other specified B group vitamins: Secondary | ICD-10-CM

## 2020-01-02 DIAGNOSIS — Z860100 Personal history of colon polyps, unspecified: Secondary | ICD-10-CM | POA: Insufficient documentation

## 2020-01-02 DIAGNOSIS — M810 Age-related osteoporosis without current pathological fracture: Secondary | ICD-10-CM

## 2020-01-02 DIAGNOSIS — N289 Disorder of kidney and ureter, unspecified: Secondary | ICD-10-CM | POA: Diagnosis not present

## 2020-01-02 DIAGNOSIS — Z1211 Encounter for screening for malignant neoplasm of colon: Secondary | ICD-10-CM

## 2020-01-02 DIAGNOSIS — E782 Mixed hyperlipidemia: Secondary | ICD-10-CM

## 2020-01-02 DIAGNOSIS — Z9889 Other specified postprocedural states: Secondary | ICD-10-CM

## 2020-01-02 DIAGNOSIS — G4700 Insomnia, unspecified: Secondary | ICD-10-CM

## 2020-01-02 LAB — LIPID PANEL
Cholesterol: 239 mg/dL — ABNORMAL HIGH (ref 0–200)
HDL: 108.3 mg/dL (ref >39.00)
LDL Cholesterol: 115 mg/dL — ABNORMAL HIGH (ref 0–99)
NonHDL: 131.05
Total CHOL/HDL Ratio: 2
Triglycerides: 82 mg/dL (ref 0.0–149.0)
VLDL: 16.4 mg/dL (ref 0.0–40.0)

## 2020-01-02 LAB — CBC
HCT: 38.1 % (ref 36.0–46.0)
Hemoglobin: 12.8 g/dL (ref 12.0–15.0)
MCHC: 33.7 g/dL (ref 30.0–36.0)
MCV: 97.3 fl (ref 78.0–100.0)
Platelets: 345 10*3/uL (ref 150.0–400.0)
RBC: 3.92 Mil/uL (ref 3.87–5.11)
RDW: 13.9 % (ref 11.5–15.5)
WBC: 6.4 10*3/uL (ref 4.0–10.5)

## 2020-01-02 LAB — COMPREHENSIVE METABOLIC PANEL
ALT: 10 U/L (ref 0–35)
AST: 17 U/L (ref 0–37)
Albumin: 4.3 g/dL (ref 3.5–5.2)
Alkaline Phosphatase: 48 U/L (ref 39–117)
BUN: 13 mg/dL (ref 6–23)
CO2: 27 mEq/L (ref 19–32)
Calcium: 9 mg/dL (ref 8.4–10.5)
Chloride: 99 mEq/L (ref 96–112)
Creatinine, Ser: 0.83 mg/dL (ref 0.40–1.20)
GFR: 65.06 mL/min (ref >60.00)
Glucose, Bld: 93 mg/dL (ref 70–99)
Potassium: 4.7 mEq/L (ref 3.5–5.1)
Sodium: 131 mEq/L — ABNORMAL LOW (ref 135–145)
Total Bilirubin: 0.5 mg/dL (ref 0.2–1.2)
Total Protein: 7.1 g/dL (ref 6.0–8.3)

## 2020-01-02 LAB — TSH: TSH: 3.34 u[IU]/mL (ref 0.35–4.50)

## 2020-01-02 LAB — VITAMIN D 25 HYDROXY (VIT D DEFICIENCY, FRACTURES): VITD: 47.11 ng/mL (ref 30.00–100.00)

## 2020-01-02 LAB — VITAMIN B12: Vitamin B-12: 381 pg/mL (ref 211–911)

## 2020-01-02 LAB — HEMOGLOBIN A1C: Hgb A1c MFr Bld: 5.4 % (ref 4.6–6.5)

## 2020-01-02 NOTE — Assessment & Plan Note (Signed)
Last colonoscopy 2012, will proceed with iFOB today

## 2020-01-02 NOTE — Assessment & Plan Note (Signed)
Hydrate and monitor 

## 2020-01-02 NOTE — Assessment & Plan Note (Signed)
Encouraged heart healthy diet, increase exercise, avoid trans fats, consider a krill oil cap daily 

## 2020-01-02 NOTE — Assessment & Plan Note (Signed)
Encouraged to get adequate exercise, calcium and vitamin d intake 

## 2020-01-02 NOTE — Progress Notes (Signed)
Patient ID: Molly Williams, female   DOB: [birth year], 86 y.o.   MRN: [id number]   Subjective:    Patient ID: Molly Williams, female    DOB: [birth year], 86 y.o.   MRN: [id number]  Chief Complaint  Patient presents with  . 6 month follow up    HPI Patient is in today for follow up on chronic medical concerns. No recent febrile illness or hospitalizations. She has had both of her covid shots. She tolerated well. She is eating well and stays active. Denies CP/palp/SOB/HA/congestion/fevers/GI or GU c/o. Taking meds as prescribed  Past Medical History:  Diagnosis Date  . Anemia 03/29/2015  . Arthritis   . Atrophic vaginitis   . Cholelithiasis 2008  . Fibrocystic breast disease    bx 2008 neg per pt report  . GERD (gastroesophageal reflux disease)   . Hearing loss   . Hearing loss sensory, bilateral    bil/hearing aids  . Hematuria    chronic Dr. Burnell Blanks  . Hyperlipidemia 11/17/2016  . Lupus (systemic lupus erythematosus) (HCC)    Dr. Charlestine Night  . Macular degeneration 11/17/2016  . Migraine aura without headache    h/o ocular migraines  . Osteopenia   . Preventative health care 11/18/2016  . Renal insufficiency 03/29/2015  . Retinal detachment with break    right/laser  . Retinal tear of right eye 11/17/2016  . Skin lesion of face 01/09/2016  . Spinal stenosis   . Thyroid disease    hypothyroidism  . Urinary tract bacterial infections    has had urethral dilations in past  . Vitamin B12 deficiency 01/09/2016  . Vitamin D deficiency 07/11/2015    Past Surgical History:  Procedure Laterality Date  . APPENDECTOMY  2006  . CATARACT EXTRACTION W/ INTRAOCULAR LENS  IMPLANT, BILATERAL    . CHOLECYSTECTOMY    . EYE SURGERY Bilateral    cataracts  . TONSILLECTOMY  1940  . VAGINAL HYSTERECTOMY     partial    Family History  Problem Relation Age of Onset  . Stroke Paternal Grandmother   . Stroke Father   . Coronary artery disease Father   . Coronary artery disease Mother   .  Stroke Mother   . Cancer Paternal Aunt        GYN cancer  . Cancer Paternal Uncle        lung, smoker  . Stroke Maternal Grandmother   . Stroke Maternal Grandfather   . Heart disease Paternal Grandfather   . Hydrocephalus Brother        normopressure hydrocephalus, dementia    Social History   Socioeconomic History  . Marital status: Widowed    Spouse name: Not on file  . Number of children: Not on file  . Years of education: Not on file  . Highest education level: Not on file  Occupational History  . Not on file  Tobacco Use  . Smoking status: Former Research scientist (life sciences)  . Smokeless tobacco: Never Used  Substance and Sexual Activity  . Alcohol use: Yes    Alcohol/week: 10.0 standard drinks    Types: 10 Glasses of wine per week  . Drug use: No  . Sexual activity: Never    Comment: lives alone here because her daughter lives hier, no dietary restrictions, avoids dairy  Other Topics Concern  . Not on file  Social History Narrative  . Not on file   Social Determinants of Health   Financial Resource Strain:   . Difficulty of Paying Living  Expenses:   Food Insecurity:   . Worried About Charity fundraiser in the Last Year:   . Arboriculturist in the Last Year:   Transportation Needs:   . Film/video editor (Medical):   Marland Kitchen Lack of Transportation (Non-Medical):   Physical Activity:   . Days of Exercise per Week:   . Minutes of Exercise per Session:   Stress:   . Feeling of Stress :   Social Connections:   . Frequency of Communication with Friends and Family:   . Frequency of Social Gatherings with Friends and Family:   . Attends Religious Services:   . Active Member of Clubs or Organizations:   . Attends Archivist Meetings:   Marland Kitchen Marital Status:   Intimate Partner Violence:   . Fear of Current or Ex-Partner:   . Emotionally Abused:   Marland Kitchen Physically Abused:   . Sexually Abused:     Outpatient Medications Prior to Visit  Medication Sig Dispense Refill  .  acetaminophen (TYLENOL) 500 MG tablet Take 1,000 mg by mouth every 6 (six) hours as needed. pain     . aspirin-acetaminophen-caffeine (EXCEDRIN MIGRAINE) 250-250-65 MG tablet Take by mouth every 6 (six) hours as needed for headache.    . Cholecalciferol (VITAMIN D) 2000 units CAPS Take 2,000 Units by mouth daily.    . Cyanocobalamin (VITAMIN B 12 PO) Take 1,000 mg by mouth daily.    . famotidine (PEPCID AC) 10 MG chewable tablet Chew 10 mg by mouth daily as needed for heartburn.    . levothyroxine (EUTHYROX) 25 MCG tablet Take 1 tablet (25 mcg total) by mouth daily before breakfast. 90 tablet 1  . omeprazole (PRILOSEC) 20 MG capsule Take 1 capsule (20 mg total) by mouth daily. 90 capsule 01  . Probiotic Product (PROBIOTIC DAILY PO) Take by mouth.    . diphenhydrAMINE (BENADRYL) 25 mg capsule Take 25 mg by mouth every 6 (six) hours as needed.    . methylPREDNISolone (MEDROL) 4 MG tablet 5 tab po qd X 3 d then 4 tab po qd X 3 d then 3 tab po qd X 3 d then 2 tab po qd x 3 d then 1 tab po qd x 3 days 45 tablet 0   No facility-administered medications prior to visit.    Allergies  Allergen Reactions  . Codeine Nausea And Vomiting and Nausea Only  . Iodine Hives    Contrast dye  . Alendronate   . Bacitracin Swelling    Ophthalmic ointment  . Iodinated Diagnostic Agents   . Bactrim  [Sulfamethoxazole-Trimethoprim] Hives, Itching and Rash    Review of Systems  Constitutional: Negative for fever and malaise/fatigue.  HENT: Negative for congestion.   Eyes: Negative for blurred vision.  Respiratory: Negative for shortness of breath.   Cardiovascular: Negative for chest pain, palpitations and leg swelling.  Gastrointestinal: Negative for abdominal pain, blood in stool and nausea.  Genitourinary: Negative for dysuria and frequency.  Musculoskeletal: Negative for falls.  Skin: Negative for rash.  Neurological: Negative for dizziness, loss of consciousness and headaches.  Endo/Heme/Allergies:  Negative for environmental allergies.  Psychiatric/Behavioral: Negative for depression. The patient is not nervous/anxious.        Objective:    Physical Exam Vitals and nursing note reviewed.  Constitutional:      General: She is not in acute distress.    Appearance: She is well-developed.  HENT:     Head: Normocephalic and atraumatic.  Nose: Nose normal.  Eyes:     General:        Right eye: No discharge.        Left eye: No discharge.  Cardiovascular:     Rate and Rhythm: Normal rate and regular rhythm.     Heart sounds: No murmur.  Pulmonary:     Effort: Pulmonary effort is normal.     Breath sounds: Normal breath sounds.  Abdominal:     General: Bowel sounds are normal.     Palpations: Abdomen is soft.     Tenderness: There is no abdominal tenderness.  Musculoskeletal:     Cervical back: Normal range of motion and neck supple.  Skin:    General: Skin is warm and dry.  Neurological:     Mental Status: She is alert and oriented to person, place, and time.     BP (!) 124/41 (BP Location: Left Arm, Cuff Size: Normal)   Pulse 67   Temp 98.1 F (36.7 C) (Temporal)   Resp 12   Ht 5\' 2"  (1.575 m)   Wt 114 lb 12.8 oz (52.1 kg)   SpO2 100%   BMI 21.00 kg/m  Wt Readings from Last 3 Encounters:  01/02/20 114 lb 12.8 oz (52.1 kg)  06/26/19 114 lb 8 oz (51.9 kg)  07/22/18 114 lb 6.4 oz (51.9 kg)    Diabetic Foot Exam - Simple   No data filed     Lab Results  Component Value Date   WBC 7.8 06/26/2019   HGB 12.7 06/26/2019   HCT 37.3 06/26/2019   PLT 335.0 06/26/2019   GLUCOSE 85 06/26/2019   CHOL 249 (H) 06/26/2019   TRIG 106.0 06/26/2019   HDL 106.30 06/26/2019   LDLCALC 121 (H) 06/26/2019   ALT 11 06/26/2019   AST 18 06/26/2019   NA 132 (L) 06/26/2019   K 5.0 06/26/2019   CL 96 06/26/2019   CREATININE 0.94 06/26/2019   BUN 16 06/26/2019   CO2 26 06/26/2019   TSH 3.43 06/26/2019   INR 0.94 09/22/2014   HGBA1C 5.5 06/26/2019    Lab Results    Component Value Date   TSH 3.43 06/26/2019   Lab Results  Component Value Date   WBC 7.8 06/26/2019   HGB 12.7 06/26/2019   HCT 37.3 06/26/2019   MCV 97.2 06/26/2019   PLT 335.0 06/26/2019   Lab Results  Component Value Date   NA 132 (L) 06/26/2019   K 5.0 06/26/2019   CO2 26 06/26/2019   GLUCOSE 85 06/26/2019   BUN 16 06/26/2019   CREATININE 0.94 06/26/2019   BILITOT 0.5 06/26/2019   ALKPHOS 89 06/26/2019   AST 18 06/26/2019   ALT 11 06/26/2019   PROT 7.6 06/26/2019   ALBUMIN 4.5 06/26/2019   CALCIUM 9.9 06/26/2019   ANIONGAP 8 09/22/2014   GFR 56.42 (L) 06/26/2019   Lab Results  Component Value Date   CHOL 249 (H) 06/26/2019   Lab Results  Component Value Date   HDL 106.30 06/26/2019   Lab Results  Component Value Date   LDLCALC 121 (H) 06/26/2019   Lab Results  Component Value Date   TRIG 106.0 06/26/2019   Lab Results  Component Value Date   CHOLHDL 2 06/26/2019   Lab Results  Component Value Date   HGBA1C 5.5 06/26/2019       Assessment & Plan:   Problem List Items Addressed This Visit    Osteoporosis    Encouraged to get  adequate exercise, calcium and vitamin d intake      Insomnia    Encouraged good sleep hygiene such as dark, quiet room. No blue/green glowing lights such as computer screens in bedroom. No alcohol or stimulants in evening. Cut down on caffeine as able. Regular exercise is helpful but not just prior to bed time. Try Melatonin 5-10 mg and can add magnesium glycinate 400 mg at bedtime      Hyperglycemia    hgba1c acceptable, minimize simple carbs. Increase exercise as tolerated.       Relevant Orders   Hemoglobin A1c   TSH   Comprehensive metabolic panel   Renal insufficiency    Hydrate and monitor      Vitamin D deficiency    Supplement and monitor      Relevant Orders   VITAMIN D 25 Hydroxy (Vit-D Deficiency, Fractures)   Vitamin B12 deficiency    Supplement and monitor      Relevant Orders   CBC    Vitamin B12   Hyperlipidemia    Encouraged heart healthy diet, increase exercise, avoid trans fats, consider a krill oil cap daily      Relevant Orders   Lipid panel   TSH   H/O colonoscopy with polypectomy    Last colonoscopy 2012, will proceed with iFOB today       Other Visit Diagnoses    Colon cancer screening    -  Primary   Relevant Orders   Fecal occult blood, imunochemical      I have discontinued Molly Williams diphenhydrAMINE and methylPREDNISolone. I am also having her maintain her acetaminophen, Probiotic Product (PROBIOTIC DAILY PO), Vitamin D, Cyanocobalamin (VITAMIN B 12 PO), famotidine, aspirin-acetaminophen-caffeine, levothyroxine, and omeprazole.  No orders of the defined types were placed in this encounter.    Penni Homans, MD

## 2020-01-02 NOTE — Assessment & Plan Note (Signed)
Encouraged good sleep hygiene such as dark, quiet room. No blue/green glowing lights such as computer screens in bedroom. No alcohol or stimulants in evening. Cut down on caffeine as able. Regular exercise is helpful but not just prior to bed time. Try Melatonin 5-10 mg and can add magnesium glycinate 400 mg at bedtime

## 2020-01-02 NOTE — Patient Instructions (Addendum)
Encouraged good sleep hygiene such as dark, quiet room. No blue/green glowing lights such as computer screens in bedroom. No alcohol or stimulants in evening. Cut down on caffeine as able. Regular exercise is helpful but not just prior to bed time.   Add Melatonin 5 to 10 mg and Magnesium Glycinate 400 mg at bedtime  Tylenol/Arthritis ES 500 mg tabs, 1 tab twice a day Can apply lidocaine topically  Can consider Turmeric//Curcumen for arthritis pain   Omron Blood Pressure cuff, upper arm, want BP 100-140/60-90 Pulse oximeter, want oxygen in 90s  Weekly vitals  Take Multivitamin with minerals, selenium Vitamin D 1000-2000 IU daily Probiotic with lactobacillus and bifidophilus Asprin EC 81 mg daily only if you get covid Fish oil or krill oil caps Melatonin 2-5 mg at bedtime  [email or web address]

## 2020-01-02 NOTE — Assessment & Plan Note (Signed)
Supplement and monitor 

## 2020-01-02 NOTE — Assessment & Plan Note (Signed)
hgba1c acceptable, minimize simple carbs. Increase exercise as tolerated.  

## 2020-01-04 ENCOUNTER — Telehealth: Payer: Self-pay

## 2020-01-04 ENCOUNTER — Other Ambulatory Visit: Payer: Self-pay | Admitting: *Deleted

## 2020-01-04 DIAGNOSIS — E871 Hypo-osmolality and hyponatremia: Secondary | ICD-10-CM

## 2020-01-09 ENCOUNTER — Other Ambulatory Visit (INDEPENDENT_AMBULATORY_CARE_PROVIDER_SITE_OTHER): Payer: Medicare Other

## 2020-01-09 DIAGNOSIS — Z1211 Encounter for screening for malignant neoplasm of colon: Secondary | ICD-10-CM

## 2020-01-09 LAB — FECAL OCCULT BLOOD, IMMUNOCHEMICAL: Fecal Occult Bld: NEGATIVE

## 2020-01-16 ENCOUNTER — Other Ambulatory Visit: Payer: Self-pay | Admitting: Family Medicine

## 2020-01-18 DIAGNOSIS — H6123 Impacted cerumen, bilateral: Secondary | ICD-10-CM | POA: Diagnosis not present

## 2020-02-02 ENCOUNTER — Other Ambulatory Visit: Payer: Self-pay

## 2020-02-02 ENCOUNTER — Other Ambulatory Visit (INDEPENDENT_AMBULATORY_CARE_PROVIDER_SITE_OTHER): Payer: Medicare Other

## 2020-02-02 DIAGNOSIS — E871 Hypo-osmolality and hyponatremia: Secondary | ICD-10-CM

## 2020-02-02 LAB — COMPREHENSIVE METABOLIC PANEL
ALT: 12 U/L (ref 0–35)
AST: 17 U/L (ref 0–37)
Albumin: 4.1 g/dL (ref 3.5–5.2)
Alkaline Phosphatase: 49 U/L (ref 39–117)
BUN: 15 mg/dL (ref 6–23)
CO2: 27 mEq/L (ref 19–32)
Calcium: 9 mg/dL (ref 8.4–10.5)
Chloride: 100 mEq/L (ref 96–112)
Creatinine, Ser: 0.87 mg/dL (ref 0.40–1.20)
GFR: 61.6 mL/min (ref >60.00)
Glucose, Bld: 91 mg/dL (ref 70–99)
Potassium: 5.1 mEq/L (ref 3.5–5.1)
Sodium: 129 mEq/L — ABNORMAL LOW (ref 135–145)
Total Bilirubin: 0.6 mg/dL (ref 0.2–1.2)
Total Protein: 6.7 g/dL (ref 6.0–8.3)

## 2020-02-06 ENCOUNTER — Encounter: Payer: Self-pay | Admitting: Family Medicine

## 2020-02-06 ENCOUNTER — Other Ambulatory Visit: Payer: Self-pay | Admitting: Family Medicine

## 2020-02-06 DIAGNOSIS — E871 Hypo-osmolality and hyponatremia: Secondary | ICD-10-CM

## 2020-02-06 NOTE — Progress Notes (Unsigned)
cxr 

## 2020-02-07 ENCOUNTER — Ambulatory Visit (HOSPITAL_BASED_OUTPATIENT_CLINIC_OR_DEPARTMENT_OTHER)
Admission: RE | Admit: 2020-02-07 | Discharge: 2020-02-07 | Disposition: A | Discharge status: Home / Self Care | Payer: Medicare Other | Source: Ambulatory Visit | Attending: Family Medicine | Admitting: Family Medicine

## 2020-02-07 ENCOUNTER — Other Ambulatory Visit: Payer: Self-pay | Admitting: Family Medicine

## 2020-02-07 ENCOUNTER — Other Ambulatory Visit: Payer: Self-pay

## 2020-02-07 DIAGNOSIS — R059 Cough, unspecified: Secondary | ICD-10-CM

## 2020-02-07 DIAGNOSIS — R05 Cough: Secondary | ICD-10-CM | POA: Diagnosis not present

## 2020-02-07 DIAGNOSIS — E871 Hypo-osmolality and hyponatremia: Secondary | ICD-10-CM | POA: Diagnosis not present

## 2020-02-07 DIAGNOSIS — J40 Bronchitis, not specified as acute or chronic: Secondary | ICD-10-CM

## 2020-02-07 MED ORDER — DOXYCYCLINE HYCLATE 100 MG PO TABS
100.0000 mg | ORAL_TABLET | Freq: Two times a day (BID) | ORAL | 0 refills | Status: DC
Start: 2020-02-07 — End: 2020-03-12

## 2020-02-07 MED ORDER — ALBUTEROL SULFATE HFA 108 (90 BASE) MCG/ACT IN AERS
2.0000 | INHALATION_SPRAY | Freq: Four times a day (QID) | RESPIRATORY_TRACT | 1 refills | Status: DC | PRN
Start: 2020-02-07 — End: 2020-06-12

## 2020-02-19 ENCOUNTER — Telehealth: Payer: Self-pay | Admitting: Family Medicine

## 2020-02-19 NOTE — Progress Notes (Signed)
  Chronic Care Management   Note  02/19/2020 Name: Molly Williams MRN: [id number] DOB: 02/13/1931  Molly Williams is a 86 y.o. year old female who is a primary care patient of Mosie Lukes, MD. I reached out to Janit Bern by phone today in response to a referral sent by [name]'s PCP, Mosie Lukes, MD.   [name] was given information about Chronic Care Management services today including:  1. CCM service includes personalized support from designated clinical staff supervised by her physician, including individualized plan of care and coordination with other care providers 2. 24/7 contact phone numbers for assistance for urgent and routine care needs. 3. Service will only be billed when office clinical staff spend 20 minutes or more in a month to coordinate care. 4. Only one practitioner may furnish and bill the service in a calendar month. 5. The patient may stop CCM services at any time (effective at the end of the month) by phone call to the office staff.   Patient agreed to services and verbal consent obtained.   This note is not being shared with the patient for the following reason: To respect privacy (The patient or proxy has requested that the information not be shared).  Follow up plan:   Earney Hamburg Upstream Scheduler

## 2020-03-04 ENCOUNTER — Other Ambulatory Visit: Payer: Self-pay | Admitting: *Deleted

## 2020-03-04 ENCOUNTER — Telehealth: Payer: Self-pay | Admitting: Family Medicine

## 2020-03-04 DIAGNOSIS — E871 Hypo-osmolality and hyponatremia: Secondary | ICD-10-CM

## 2020-03-11 NOTE — Progress Notes (Signed)
Subjective:   Molly Williams is a 86 y.o. female who presents for Medicare Annual (Subsequent) preventive examination.  Review of Systems     Cardiac Risk Factors include: advanced age (>62men, >36 women);dyslipidemia     Objective:    Today's Vitals   03/12/20 0951  BP: 140/72  Pulse: 65  Temp: (!) 97 F (36.1 C)  TempSrc: Oral  SpO2: 98%  Weight: 115 lb (52.2 kg)  Height: 5\' 2"  (1.575 m)   Body mass index is 21.03 kg/m.  Advanced Directives 03/12/2020 08/09/2018 09/28/2017 10/09/2016 09/22/2014  Does Patient Have a Medical Advance Directive? Yes Yes Yes Yes No  Type of Paramedic of [place];Living will - [place];Living will [place];Living will -  Does patient want to make changes to medical advance directive? No - Patient declined No - Patient declined - - -  Copy of [place] in Chart? Yes - validated most recent copy scanned in chart (See row information) - Yes No - copy requested -  Would patient like information on creating a medical advance directive? - - - - No - patient declined information    Current Medications (verified) Outpatient Encounter Medications as of 03/12/2020  Medication Sig  . acetaminophen (TYLENOL) 500 MG tablet Take 1,000 mg by mouth every 6 (six) hours as needed. pain   . albuterol (VENTOLIN HFA) 108 (90 Base) MCG/ACT inhaler Inhale 2 puffs into the lungs every 6 (six) hours as needed for wheezing or shortness of breath.  Marland Kitchen aspirin-acetaminophen-caffeine (EXCEDRIN MIGRAINE) 250-250-65 MG tablet Take by mouth every 6 (six) hours as needed for headache.  . Cholecalciferol (VITAMIN D) 2000 units CAPS Take 2,000 Units by mouth daily.  . Cyanocobalamin (VITAMIN B 12 PO) Take 1,000 mg by mouth daily.  . famotidine (PEPCID AC) 10 MG chewable tablet Chew 10 mg by mouth daily as needed for heartburn.  . levothyroxine (EUTHYROX) 25 MCG tablet Take 1 tablet (25 mcg total) by  mouth daily before breakfast.  . omeprazole (PRILOSEC) 20 MG capsule Take 1 capsule by mouth once daily  . Probiotic Product (PROBIOTIC DAILY PO) Take by mouth.  . [DISCONTINUED] doxycycline (VIBRA-TABS) 100 MG tablet Take 1 tablet (100 mg total) by mouth 2 (two) times daily.   No facility-administered encounter medications on file as of 03/12/2020.    Allergies (verified) Codeine, Iodine, Alendronate, Bacitracin, Iodinated diagnostic agents, and Bactrim  [sulfamethoxazole-trimethoprim]   History: Past Medical History:  Diagnosis Date  . Anemia 03/29/2015  . Arthritis   . Atrophic vaginitis   . Cholelithiasis 2008  . Fibrocystic breast disease    bx 2008 neg per pt report  . GERD (gastroesophageal reflux disease)   . Hearing loss   . Hearing loss sensory, bilateral    bil/hearing aids  . Hematuria    chronic Dr. Burnell Blanks  . Hyperlipidemia 11/17/2016  . Lupus (systemic lupus erythematosus) (HCC)    Dr. Charlestine Night  . Macular degeneration 11/17/2016  . Migraine aura without headache    h/o ocular migraines  . Osteopenia   . Preventative health care 11/18/2016  . Renal insufficiency 03/29/2015  . Retinal detachment with break    right/laser  . Retinal tear of right eye 11/17/2016  . Skin lesion of face 01/09/2016  . Spinal stenosis   . Thyroid disease    hypothyroidism  . Urinary tract bacterial infections    has had urethral dilations in past  . Vitamin B12 deficiency 01/09/2016  .  Vitamin D deficiency 07/11/2015   Past Surgical History:  Procedure Laterality Date  . APPENDECTOMY  2006  . CATARACT EXTRACTION W/ INTRAOCULAR LENS  IMPLANT, BILATERAL    . CHOLECYSTECTOMY    . EYE SURGERY Bilateral    cataracts  . TONSILLECTOMY  1940  . VAGINAL HYSTERECTOMY     partial   Family History  Problem Relation Age of Onset  . Stroke Paternal Grandmother   . Stroke Father   . Coronary artery disease Father   . Coronary artery disease Mother   . Stroke Mother   . Cancer Paternal  Aunt        GYN cancer  . Cancer Paternal Uncle        lung, smoker  . Stroke Maternal Grandmother   . Stroke Maternal Grandfather   . Heart disease Paternal Grandfather   . Hydrocephalus Brother        normopressure hydrocephalus, dementia   Social History   Socioeconomic History  . Marital status: Widowed    Spouse name: Not on file  . Number of children: Not on file  . Years of education: Not on file  . Highest education level: Not on file  Occupational History  . Not on file  Tobacco Use  . Smoking status: Former Smoker    Quit date: 1970    Years since quitting: 51.5  . Smokeless tobacco: Never Used  Substance and Sexual Activity  . Alcohol use: Yes    Alcohol/week: 10.0 standard drinks    Types: 10 Glasses of wine per week  . Drug use: No  . Sexual activity: Never    Comment: lives alone here because her daughter lives hier, no dietary restrictions, avoids dairy  Other Topics Concern  . Not on file  Social History Narrative  . Not on file   Social Determinants of Health   Financial Resource Strain: Low Risk   . Difficulty of Paying Living Expenses: Not hard at all  Food Insecurity: No Food Insecurity  . Worried About Charity fundraiser in the Last Year: Never true  . Ran Out of Food in the Last Year: Never true  Transportation Needs: No Transportation Needs  . Lack of Transportation (Medical): No  . Lack of Transportation (Non-Medical): No  Physical Activity:   . Days of Exercise per Week:   . Minutes of Exercise per Session:   Stress:   . Feeling of Stress :   Social Connections:   . Frequency of Communication with Friends and Family:   . Frequency of Social Gatherings with Friends and Family:   . Attends Religious Services:   . Active Member of Clubs or Organizations:   . Attends Archivist Meetings:   Marland Kitchen Marital Status:     Tobacco Counseling Counseling given: Not Answered   Clinical Intake: Pain : No/denies pain    Activities of  Daily Living In your present state of health, do you have any difficulty performing the following activities: 03/12/2020  Hearing? Y  Comment wears hearing aids.  Vision? N  Difficulty concentrating or making decisions? N  Walking or climbing stairs? N  Dressing or bathing? N  Doing errands, shopping? N  Preparing Food and eating ? N  Using the Toilet? N  In the past six months, have you accidently leaked urine? N  Do you have problems with loss of bowel control? N  Managing your Medications? N  Managing your Finances? N  Housekeeping or managing your Housekeeping? N  Some recent data might be hidden    Patient Care Team: Mosie Lukes, MD as PCP - General (Family Medicine) [place], Jennefer Bravo, MD as Referring Physician (Dermatology) Day, Melvenia Beam, [facility] as Pharmacist (Pharmacist)  Indicate any recent Medical Services you may have received from other than [facility] providers in the past year (date may be approximate).     Assessment:   This is a routine wellness examination for Molly Williams.  Dietary issues and exercise activities discussed: Current Exercise Habits: Home exercise routine, Type of exercise: strength training/weights;stretching, Time (Minutes): 15, Frequency (Times/Week): 7, Weekly Exercise (Minutes/Week): 105, Intensity: Mild, Exercise limited by: None identified   Diet (meal preparation, eat out, water intake, caffeinated beverages, dairy products, fruits and vegetables): well balanced      Goals    .  Maintain current healthy lifestyle and independece. (pt-stated)      Depression Screen PHQ 2/9 Scores 03/12/2020 06/26/2019 09/28/2017 01/09/2016 10/25/2014 07/10/2013  PHQ - 2 Score 0 0 0 0 0 0  PHQ- 9 Score - 3 - - - -    Fall Risk Fall Risk  03/12/2020 09/28/2017 01/09/2016 10/25/2014 07/10/2013  Falls in the past year? 1 No No No No  Number falls in past yr: 0 - - - -  Injury with Fall? 0 - - - -  Follow up Falls prevention discussed;Education provided - - - -    Lives alone in town home. Dtr visits very often/ lives close.  Any stairs in or around the home? No  If so, are there any without handrails? No  Home free of loose throw rugs in walkways, pet beds, electrical cords, etc? Yes  Adequate lighting in your home to reduce risk of falls? Yes   ASSISTIVE DEVICES UTILIZED TO PREVENT FALLS:  Life alert? No  Use of a cane, walker or w/c? No  Grab bars in the bathroom? Yes  Shower chair or bench in shower? Yes  Elevated toilet seat or a handicapped toilet? No     Cognitive Function:    MMSE - Mini Mental State Exam 09/28/2017  Orientation to time 5  Orientation to Place 5  Registration 3  Attention/ Calculation 5  Recall 1  Language- name 2 objects 2  Language- repeat 1  Language- follow 3 step command 3  Language- read & follow direction 1  Write a sentence 1  Copy design 1  Total score 28     6CIT Screen 03/12/2020  What Year? 0 points  What month? 0 points  What time? 0 points  Count back from 20 0 points  Months in reverse 0 points  Repeat phrase 0 points  Total Score 0    Immunizations Immunization History  Administered Date(s) Administered  . Fluad Quad(high Dose 65+) 06/15/2019  . Influenza Split 07/10/2011, 07/05/2012  . Influenza Whole 07/15/2010  . Influenza, High Dose Seasonal PF 06/29/2016, 06/29/2017, 06/13/2018  . Influenza,inj,Quad PF,6+ Mos 06/27/2013, 07/04/2014, 07/11/2015  . Moderna SARS-COVID-2 Vaccination 11/09/2019, 12/07/2019  . Pneumococcal Conjugate-13 07/14/2008  . Pneumococcal Polysaccharide-23 07/11/2015  . Td 09/15/2007  . Tdap 06/22/2018  . Zoster 09/14/2006    TDAP status: Up to date Flu Vaccine status: Up to date Pneumococcal vaccine status: Up to date Covid-19 vaccine status: Completed vaccines  Qualifies for Shingles Vaccine?   Zostavax completed Yes     Screening Tests Health Maintenance  Topic Date Due  . INFLUENZA VACCINE  04/14/2020  . MAMMOGRAM  09/10/2020  .  TETANUS/TDAP  06/22/2028  . DEXA  SCAN  Completed  . COVID-19 Vaccine  Completed  . PNA vac Low Risk Adult  Completed    Health Maintenance  There are no preventive care reminders to display for this patient.  Colorectal cancer screening: No longer required.  Mammogram status: Completed 09/11/19. Repeat every year Bone Density status: Completed 09/28/17. Results reflect: Bone density results: OSTEOPOROSIS. Repeat every 2 years.  Lung Cancer Screening: (Low Dose CT Chest recommended if Age 58-80 years, 30 pack-year currently smoking OR have quit w/in 15years.) does not qualify.   Additional Screening:  Hepatitis C Screening: does not qualify  Vision Screening: Recommended annual ophthalmology exams for early detection of glaucoma and other disorders of the eye. Is the patient up to date with their annual eye exam?  Yes  Who is the provider or what is the name of the office in which the patient attends annual eye exams? Dr.Digby   Dental Screening: Recommended annual dental exams for proper oral hygiene  Community Resource Referral / Chronic Care Management: CRR required this visit?  No   CCM required this visit?  No      Plan:    Please schedule your next medicare wellness visit with me in 1 yr.  Continue to eat heart healthy diet (full of fruits, vegetables, whole grains, lean protein, water--limit salt, fat, and sugar intake) and increase physical activity as tolerated.  Continue doing brain stimulating activities (puzzles, reading, adult coloring books, staying active) to keep memory sharp.   Keep up the great work!!  I have personally reviewed and noted the following in the patient's chart:   . Medical and social history . Use of alcohol, tobacco or illicit drugs  . Current medications and supplements . Functional ability and status . Nutritional status . Physical activity . Advanced directives . List of other physicians . Hospitalizations, surgeries, and ER  visits in previous 12 months . Vitals . Screenings to include cognitive, depression, and falls . Referrals and appointments  In addition, I have reviewed and discussed with patient certain preventive protocols, quality metrics, and best practice recommendations. A written personalized care plan for preventive services as well as general preventive health recommendations were provided to patient.     Naaman Plummer [place], [place]   03/12/2020

## 2020-03-12 ENCOUNTER — Other Ambulatory Visit: Payer: Self-pay

## 2020-03-12 ENCOUNTER — Encounter: Payer: Self-pay | Admitting: *Deleted

## 2020-03-12 ENCOUNTER — Ambulatory Visit (INDEPENDENT_AMBULATORY_CARE_PROVIDER_SITE_OTHER): Payer: Medicare Other | Admitting: *Deleted

## 2020-03-12 VITALS — BP 140/72 | HR 65 | Temp 97.0°F | Ht 62.0 in | Wt 115.0 lb

## 2020-03-12 DIAGNOSIS — Z0000 Encounter for general adult medical examination without abnormal findings: Secondary | ICD-10-CM

## 2020-03-12 NOTE — Patient Instructions (Signed)
Please schedule your next medicare wellness visit with me in 1 yr.  Continue to eat heart healthy diet (full of fruits, vegetables, whole grains, lean protein, water--limit salt, fat, and sugar intake) and increase physical activity as tolerated.  Continue doing brain stimulating activities (puzzles, reading, adult coloring books, staying active) to keep memory sharp.   Keep up the great work!!   Molly Williams , Thank you for taking time to come for your Medicare Wellness Visit. I appreciate your ongoing commitment to your health goals. Please review the following plan we discussed and let me know if I can assist you in the future.   These are the goals we discussed: Goals    .  Maintain current healthy lifestyle and independece. (pt-stated)       This is a list of the screening recommended for you and due dates:  Health Maintenance  Topic Date Due  . Flu Shot  04/14/2020  . Mammogram  09/10/2020  . Tetanus Vaccine  06/22/2028  . DEXA scan (bone density measurement)  Completed  . COVID-19 Vaccine  Completed  . Pneumonia vaccines  Completed    Preventive Care 84 Years and Older, Female Preventive care refers to lifestyle choices and visits with your health care provider that can promote health and wellness. This includes:  A yearly physical exam. This is also called an annual well check.  Regular dental and eye exams.  Immunizations.  Screening for certain conditions.  Healthy lifestyle choices, such as diet and exercise. What can I expect for my preventive care visit? Physical exam Your health care provider will check:  Height and weight. These may be used to calculate body mass index (BMI), which is a measurement that tells if you are at a healthy weight.  Heart rate and blood pressure.  Your skin for abnormal spots. Counseling Your health care provider may ask you questions about:  Alcohol, tobacco, and drug use.  Emotional well-being.  Home and relationship  well-being.  Sexual activity.  Eating habits.  History of falls.  Memory and ability to understand (cognition).  Work and work Statistician.  Pregnancy and menstrual history. What immunizations do I need?  Influenza (flu) vaccine  This is recommended every year. Tetanus, diphtheria, and pertussis (Tdap) vaccine  You may need a Td booster every 10 years. Varicella (chickenpox) vaccine  You may need this vaccine if you have not already been vaccinated. Zoster (shingles) vaccine  You may need this after age 84. Pneumococcal conjugate (PCV13) vaccine  One dose is recommended after age 84. Pneumococcal polysaccharide (PPSV23) vaccine  One dose is recommended after age 84. Measles, mumps, and rubella (MMR) vaccine  You may need at least one dose of MMR if you were born in 1957 or later. You may also need a second dose. Meningococcal conjugate (MenACWY) vaccine  You may need this if you have certain conditions. Hepatitis A vaccine  You may need this if you have certain conditions or if you travel or work in places where you may be exposed to hepatitis A. Hepatitis B vaccine  You may need this if you have certain conditions or if you travel or work in places where you may be exposed to hepatitis B. Haemophilus influenzae type b (Hib) vaccine  You may need this if you have certain conditions. You may receive vaccines as individual doses or as more than one vaccine together in one shot (combination vaccines). Talk with your health care provider about the risks and benefits of combination  vaccines. What tests do I need? Blood tests  Lipid and cholesterol levels. These may be checked every 5 years, or more frequently depending on your overall health.  Hepatitis C test.  Hepatitis B test. Screening  Lung cancer screening. You may have this screening every year starting at age 84 if you have a 30-pack-year history of smoking and currently smoke or have quit within the past  15 years.  Colorectal cancer screening. All adults should have this screening starting at age 84 and continuing until age 84. Your health care provider may recommend screening at age 84 if you are at increased risk. You will have tests every 1-10 years, depending on your results and the type of screening test.  Diabetes screening. This is done by checking your blood sugar (glucose) after you have not eaten for a while (fasting). You may have this done every 1-3 years.  Mammogram. This may be done every 1-2 years. Talk with your health care provider about how often you should have regular mammograms.  BRCA-related cancer screening. This may be done if you have a family history of breast, ovarian, tubal, or peritoneal cancers. Other tests  Sexually transmitted disease (STD) testing.  Bone density scan. This is done to screen for osteoporosis. You may have this done starting at age 84. Follow these instructions at home: Eating and drinking  Eat a diet that includes fresh fruits and vegetables, whole grains, lean protein, and low-fat dairy products. Limit your intake of foods with high amounts of sugar, saturated fats, and salt.  Take vitamin and mineral supplements as recommended by your health care provider.  Do not drink alcohol if your health care provider tells you not to drink.  If you drink alcohol: ? Limit how much you have to 0-1 drink a day. ? Be aware of how much alcohol is in your drink. In the [place] one drink equals one 12 oz bottle of beer (355 mL), one 5 oz glass of wine (148 mL), or one 1 oz glass of hard liquor (44 mL). Lifestyle  Take daily care of your teeth and gums.  Stay active. Exercise for at least 30 minutes on 5 or more days each week.  Do not use any products that contain nicotine or tobacco, such as cigarettes, e-cigarettes, and chewing tobacco. If you need help quitting, ask your health care provider.  If you are sexually active, practice safe sex. Use a  condom or other form of protection in order to prevent STIs (sexually transmitted infections).  Talk with your health care provider about taking a low-dose aspirin or statin. What's next?  Go to your health care provider once a year for a well check visit.  Ask your health care provider how often you should have your eyes and teeth checked.  Stay up to date on all vaccines. This information is not intended to replace advice given to you by your health care provider. Make sure you discuss any questions you have with your health care provider. Document Revised: 08/25/2018 Document Reviewed: 08/25/2018 Elsevier Patient Education  2020 [employer].

## 2020-03-14 ENCOUNTER — Other Ambulatory Visit: Payer: Self-pay

## 2020-03-14 ENCOUNTER — Other Ambulatory Visit (INDEPENDENT_AMBULATORY_CARE_PROVIDER_SITE_OTHER): Payer: Medicare Other

## 2020-03-14 DIAGNOSIS — E871 Hypo-osmolality and hyponatremia: Secondary | ICD-10-CM | POA: Diagnosis not present

## 2020-03-14 LAB — COMPREHENSIVE METABOLIC PANEL
ALT: 11 U/L (ref 0–35)
AST: 17 U/L (ref 0–37)
Albumin: 4.2 g/dL (ref 3.5–5.2)
Alkaline Phosphatase: 46 U/L (ref 39–117)
BUN: 15 mg/dL (ref 6–23)
CO2: 26 mEq/L (ref 19–32)
Calcium: 9.3 mg/dL (ref 8.4–10.5)
Chloride: 102 mEq/L (ref 96–112)
Creatinine, Ser: 1.18 mg/dL (ref 0.40–1.20)
GFR: 43.33 mL/min — ABNORMAL LOW (ref >60.00)
Glucose, Bld: 108 mg/dL — ABNORMAL HIGH (ref 70–99)
Potassium: 4.4 mEq/L (ref 3.5–5.1)
Sodium: 135 mEq/L (ref 135–145)
Total Bilirubin: 0.3 mg/dL (ref 0.2–1.2)
Total Protein: 6.9 g/dL (ref 6.0–8.3)

## 2020-04-04 ENCOUNTER — Encounter: Payer: Self-pay | Admitting: *Deleted

## 2020-04-11 ENCOUNTER — Telehealth: Payer: Medicare Other

## 2020-05-08 ENCOUNTER — Other Ambulatory Visit: Payer: Self-pay | Admitting: Family Medicine

## 2020-06-07 ENCOUNTER — Telehealth: Payer: Self-pay | Admitting: Pharmacist

## 2020-06-07 NOTE — Progress Notes (Addendum)
Chronic Care Management Pharmacy Assistant   Name: Molly Williams  MRN: [id number] DOB: Apr 08, 1931  Reason for Encounter: Initial Questions    PCP : Mosie Lukes, MD    Allergies:   Allergies  Allergen Reactions  . Codeine Nausea And Vomiting and Nausea Only  . Iodine Hives    Contrast dye  . Alendronate   . Bacitracin Swelling    Ophthalmic ointment  . Iodinated Diagnostic Agents   . Bactrim  [Sulfamethoxazole-Trimethoprim] Hives, Itching and Rash    Medications: Outpatient Encounter Medications as of 06/07/2020  Medication Sig Note  . acetaminophen (TYLENOL) 500 MG tablet Take 1,000 mg by mouth every 6 (six) hours as needed. pain  12/18/2010: RARELY TAKES  . albuterol (VENTOLIN HFA) 108 (90 Base) MCG/ACT inhaler Inhale 2 puffs into the lungs every 6 (six) hours as needed for wheezing or shortness of breath.   Marland Kitchen aspirin-acetaminophen-caffeine (EXCEDRIN MIGRAINE) 250-250-65 MG tablet Take by mouth every 6 (six) hours as needed for headache.   . Cholecalciferol (VITAMIN D) 2000 units CAPS Take 2,000 Units by mouth daily.   . Cyanocobalamin (VITAMIN B 12 PO) Take 1,000 mg by mouth daily.   . famotidine (PEPCID AC) 10 MG chewable tablet Chew 10 mg by mouth daily as needed for heartburn.   . levothyroxine (EUTHYROX) 25 MCG tablet Take 1 tablet (25 mcg total) by mouth daily before breakfast.   . omeprazole (PRILOSEC) 20 MG capsule Take 1 capsule by mouth once daily   . Probiotic Product (PROBIOTIC DAILY PO) Take by mouth.    No facility-administered encounter medications on file as of 06/07/2020.    Current Diagnosis: Patient Active Problem List   Diagnosis Date Noted  . H/O colonoscopy with polypectomy 01/02/2020  . Migraine 06/26/2019  . Low back pain 01/13/2018  . Muscle cramps 01/13/2018  . Preventative health care 11/18/2016  . Hyperlipidemia 11/17/2016  . Atherosclerosis of aorta ([place]) 11/17/2016  . Palpitations 11/17/2016  . Retinal tear of right eye  11/17/2016  . Macular degeneration 11/17/2016  . Skin lesion of face 01/09/2016  . Vitamin B12 deficiency 01/09/2016  . Vitamin D deficiency 07/11/2015  . Renal insufficiency 03/29/2015  . Anemia 03/29/2015  . Hyperglycemia 01/30/2015  . Osteoporosis 09/27/2013  . Insomnia 09/27/2013  . Allergic rhinitis 12/26/2012  . GERD (gastroesophageal reflux disease) 04/21/2011  . Thyroid disease   . Arthritis   . Urinary tract bacterial infections   . Atrophic vaginitis   . Spinal stenosis   . Fibrocystic breast disease   . Lupus (systemic lupus erythematosus) ([place])   . Hearing loss sensory, bilateral     Goals Addressed   None    Have you seen any other providers since your last visit? no Any changes in your medications or health? no Any side effects from any medications? no Do you have an symptoms or problems not managed by your medications? no Any concerns about your health right now? no Has your provider asked that you check blood pressure, blood sugar, or follow special diet at home? no Do you get any type of exercise on a regular basis? Yes Everyday upper body exercises, step exercises and works balance. Can you think of a goal you would like to reach for your health? None at the time.  Patient feels she is pretty healthy for her age. Do you have any problems getting your medications? no Is there anything that you would like to discuss during the appointment? None at the time  which Covid was not around.  Please have medications and supplements around for the pharmacist to review for your appointment on 06-11-20 at 9:00 am via telephone.   Follow-Up:  Pharmacist Review   [place] Shannon, [facility] [place] Pharmacist Assistant [phone]  Reviewed by: De Blanch, PharmD Clinical Pharmacist [place] [facility] [phone]

## 2020-06-10 ENCOUNTER — Other Ambulatory Visit: Payer: Self-pay | Admitting: Family Medicine

## 2020-06-11 ENCOUNTER — Other Ambulatory Visit: Payer: Self-pay

## 2020-06-11 ENCOUNTER — Ambulatory Visit: Payer: Medicare Other | Admitting: Pharmacist

## 2020-06-11 DIAGNOSIS — E079 Disorder of thyroid, unspecified: Secondary | ICD-10-CM

## 2020-06-11 DIAGNOSIS — M810 Age-related osteoporosis without current pathological fracture: Secondary | ICD-10-CM

## 2020-06-11 NOTE — Patient Instructions (Signed)
Visit Information  Goals Addressed              This Visit's Progress   .  Chronic Care Management Pharmacy Care Plan        CARE PLAN ENTRY (see longitudinal plan of care for additional care plan information)  Current Barriers:  . Chronic Disease Management support, education, and care coordination needs related to Osteoporosis, Hypothyroidism, GERD, Migraine, Insomnia   Osteoporosis . Pharmacist Clinical Goal(s) o Over the next 180 days, patient will work with PharmD and providers to reduce risk of fracture due to osteoporosis . Current regimen:  o Vitamin D 2000 units daily . Interventions: o Discussed intake of 129m of calcium daily through diet and/or supplementation o Discussed that calcium supplements should be taken with or after meals.  o Since patient is using PPI, she would benefit from using a calcium citrate supplement . Patient self care activities - Over the next 180 days, patient will: o Consider intake of 12051mof calcium daily through diet and/or supplementation o Take calcium supplements with or after eating meals  Hypothyroidism . Pharmacist Clinical Goal(s) o Over the next 180 days, patient will work with PharmD and providers to reduce symptoms associated with hypothyroidism . Current regimen:  o Euthyrox 2583m1 tab daily except 2 tabs on Tuesdays and Saturdays . Patient self care activities - Over the next 180 days, patient will: o Maintain hypothyroidism medication regimen  Health Maintenance  . Pharmacist Clinical Goal(s) o Over the next 180 days, patient will work with PharmD and providers to complete health maintenance screenings/vaccinations . Interventions: o Discussed completing Shingrix vaccine series . Patient self care activities - Over the next 180 days, patient will: o Complete Shingrix vaccine series    Medication management . Pharmacist Clinical Goal(s): o Over the next 180 days, patient will work with PharmD and providers to  maintain optimal medication adherence . [facility]: [facility] . Interventions o Comprehensive medication review performed. o Utilize [facility] for medication synchronization, packaging and delivery . Patient self care activities - Over the next 180 days, patient will: o Focus on medication adherence by filling and taking medications appropriately  o Take medications as prescribed o Report any questions or concerns to PharmD and/or provider(s)  Initial goal documentation     .  Maintain current healthy lifestyle and independence (pt-stated)      .  Manage My Medicine        Follow Up Date 12/09/2020    - call for medicine refill 2 or 3 days before it runs out - keep a list of all the medicines I take; vitamins and herbals too    Why is this important?   These steps will help you keep on track with your medicines.    Notes: Verbal consent obtained for [facility] enhanced pharmacy services (medication synchronization, adherence packaging, delivery coordination). A medication sync plan was created to allow patient to get all medications delivered once every 30 to 90 days per patient preference. Patient understands they have freedom to choose pharmacy and clinical pharmacist will coordinate care between all prescribers and [facility].        Ms. MorHassans given information about Chronic Care Management services today including:  1. CCM service includes personalized support from designated clinical staff supervised by her physician, including individualized plan of care and coordination with other care providers 2. 24/7 contact phone numbers for assistance for urgent and routine care needs. 3. Standard  insurance, coinsurance, copays and deductibles apply for chronic care management only during months in which we provide at least 20 minutes of these services. Most insurances cover these services at 100%, however patients may be responsible for  any copay, coinsurance and/or deductible if applicable. This service may help you avoid the need for more expensive face-to-face services. 4. Only one practitioner may furnish and bill the service in a calendar month. 5. The patient may stop CCM services at any time (effective at the end of the month) by phone call to the office staff.  Patient agreed to services and verbal consent obtained.   Verbal consent obtained for [facility] enhanced pharmacy services (medication synchronization, adherence packaging, delivery coordination). A medication sync plan was created to allow patient to get all medications delivered once every 30 to 90 days per patient preference. Patient understands they have freedom to choose pharmacy and clinical pharmacist will coordinate care between all prescribers and [facility].    The patient verbalized understanding of instructions provided today and agreed to receive a mailed copy of patient instruction and/or educational materials. Telephone follow up appointment with pharmacy team member scheduled for: 12/09/2020  Melvenia Beam Jameica Couts, PharmD Clinical Pharmacist [facility] [phone]  Osteoporosis  Osteoporosis happens when your bones get thin and weak. This can cause your bones to break (fracture) more easily. You can do things at home to make your bones stronger. Follow these instructions at home:  Activity  Exercise as told by your doctor. Ask your doctor what activities are safe for you. You should do: ? Exercises that make your muscles work to hold your body weight up (weight-bearing exercises). These include tai chi, yoga, and walking. ? Exercises to make your muscles stronger. One example is lifting weights. Lifestyle  Limit alcohol intake to no more than 1 drink a Jaquille Kau for nonpregnant women and 2 drinks a Raeshawn Vo for men. One drink equals 12 oz of beer, 5 oz of wine, or 1 oz of hard liquor.  Do not use any  products that have nicotine or tobacco in them. These include cigarettes and e-cigarettes. If you need help quitting, ask your doctor. Preventing falls  Use tools to help you move around (mobility aids) as needed. These include canes, walkers, scooters, and crutches.  Keep rooms well-lit and free of clutter.  Put away things that could make you trip. These include cords and rugs.  Install safety rails on stairs. Install grab bars in bathrooms.  Use rubber mats in slippery areas, like bathrooms.  Wear shoes that: ? Fit you well. ? Support your feet. ? Have closed toes. ? Have rubber soles or low heels.  Tell your doctor about all of the medicines you are taking. Some medicines can make you more likely to fall. General instructions  Eat plenty of calcium and vitamin D. These nutrients are good for your bones. Good sources of calcium and vitamin D include: ? Some fatty fish, such as salmon and tuna. ? Foods that have calcium and vitamin D added to them (fortified foods). For example, some breakfast cereals are fortified with calcium and vitamin D. ? Egg yolks. ? Cheese. ? Liver.  Take over-the-counter and prescription medicines only as told by your doctor.  Keep all follow-up visits as told by your doctor. This is important. Contact a doctor if:  You have not been tested (screened) for osteoporosis and you are: ? A woman who is age 23 or older. ? A man  who is age 34 or older. Get help right away if:  You fall.  You get hurt. Summary  Osteoporosis happens when your bones get thin and weak.  Weak bones can break (fracture) more easily.  Eat plenty of calcium and vitamin D. These nutrients are good for your bones.  Tell your doctor about all of the medicines that you take. This information is not intended to replace advice given to you by your health care provider. Make sure you discuss any questions you have with your health care provider. Document Revised: 08/13/2017  Document Reviewed: 06/25/2017 Elsevier Patient Education  2020 [employer].

## 2020-06-11 NOTE — Chronic Care Management (AMB) (Signed)
Chronic Care Management Pharmacy  Name: Molly Williams  MRN: [id number] DOB: 1931-08-03  Chief Complaint/ HPI  Molly Williams,  86 y.o. , female presents for their Initial CCM visit with the clinical pharmacist via telephone due to COVID-19 Pandemic.  PCP : Mosie Lukes, MD  Their chronic conditions include: Osteoporosis, Hypothyroidism, GERD, Migraine, Insomnia  Office Visits: 03/12/20: Medicare Annual Wellness Exam w/ Naaman Plummer, RN  01/02/20: Visit w/ Dr. Charlett Blake -  Osteoporosis - adequate exercises, calcium, and vitamin D intake. Insomnia - sleep hygiene, melatonin 5-10mg  and magnesium glycinate 400mg  at bedtime. Preventative Health - Colon cancer screening.  Consult Visit: 01/18/20: Otolaryngology visit w/ Dr. Constance Holster - Cerumen impaction. Cerumen removed.   Medications: Outpatient Encounter Medications as of 06/11/2020  Medication Sig Note  . acetaminophen (TYLENOL) 500 MG tablet Take 1,000 mg by mouth every 6 (six) hours as needed. pain  12/18/2010: RARELY TAKES  . albuterol (VENTOLIN HFA) 108 (90 Base) MCG/ACT inhaler Inhale 2 puffs into the lungs every 6 (six) hours as needed for wheezing or shortness of breath.   Marland Kitchen aspirin-acetaminophen-caffeine (EXCEDRIN MIGRAINE) 250-250-65 MG tablet Take by mouth every 6 (six) hours as needed for headache.   . Cholecalciferol (VITAMIN D) 2000 units CAPS Take 2,000 Units by mouth daily.   Arna Medici 25 MCG tablet TAKE 1 TABLET BY MOUTH ONCE DAILY EXCEPT  TAKE  2  TABLETS  ON  TUESDAYS  AND  SATURDAYS.   . famotidine (PEPCID) 20 MG tablet Take 20 mg by mouth daily as needed for heartburn.    Marland Kitchen omeprazole (PRILOSEC) 20 MG capsule Take 1 capsule by mouth once daily   . Probiotic Product (PROBIOTIC DAILY PO) Take by mouth. 06/11/2020: Renew Life Ultimate Flora (uses occasionally)  . Cyanocobalamin (VITAMIN B 12 PO) Take 1,000 mg by mouth daily. (Patient not taking: Reported on 06/11/2020)    No facility-administered encounter medications on  file as of 06/11/2020.   SDOH Screenings   Alcohol Screen:   . Last Alcohol Screening Score (AUDIT): Not on file  Depression (PHQ2-9): Low Risk   . PHQ-2 Score: 0  Financial Resource Strain: Low Risk   . Difficulty of Paying Living Expenses: Not hard at all  Food Insecurity: No Food Insecurity  . Worried About Charity fundraiser in the Last Year: Never true  . Ran Out of Food in the Last Year: Never true  Housing: Low Risk   . Last Housing Risk Score: 0  Physical Activity:   . Days of Exercise per Week: Not on file  . Minutes of Exercise per Session: Not on file  Social Connections:   . Frequency of Communication with Friends and Family: Not on file  . Frequency of Social Gatherings with Friends and Family: Not on file  . Attends Religious Services: Not on file  . Active Member of Clubs or Organizations: Not on file  . Attends Archivist Meetings: Not on file  . Marital Status: Not on file  Stress:   . Feeling of Stress : Not on file  Tobacco Use: Medium Risk  . Smoking Tobacco Use: Former Smoker  . Smokeless Tobacco Use: Never Used  Transportation Needs: No Transportation Needs  . Lack of Transportation (Medical): No  . Lack of Transportation (Non-Medical): No     Current Diagnosis/Assessment:  Goals Addressed              This Visit's Progress   .  Chronic Care Management Pharmacy Care  Plan        CARE PLAN ENTRY (see longitudinal plan of care for additional care plan information)  Current Barriers:  . Chronic Disease Management support, education, and care coordination needs related to Osteoporosis, Hypothyroidism, GERD, Migraine, Insomnia   Osteoporosis . Pharmacist Clinical Goal(s) o Over the next 180 days, patient will work with PharmD and providers to reduce risk of fracture due to osteoporosis . Current regimen:  o Vitamin D 2000 units daily . Interventions: o Discussed intake of 1200mg  of calcium daily through diet and/or  supplementation o Discussed that calcium supplements should be taken with or after meals.  o Since patient is using PPI, she would benefit from using a calcium citrate supplement . Patient self care activities - Over the next 180 days, patient will: o Consider intake of 1200mg  of calcium daily through diet and/or supplementation o Take calcium supplements with or after eating meals  Hypothyroidism . Pharmacist Clinical Goal(s) o Over the next 180 days, patient will work with PharmD and providers to reduce symptoms associated with hypothyroidism . Current regimen:  o Euthyrox 68mcg 1 tab daily except 2 tabs on Tuesdays and Saturdays . Patient self care activities - Over the next 180 days, patient will: o Maintain hypothyroidism medication regimen  Health Maintenance  . Pharmacist Clinical Goal(s) o Over the next 180 days, patient will work with PharmD and providers to complete health maintenance screenings/vaccinations . Interventions: o Discussed completing Shingrix vaccine series . Patient self care activities - Over the next 180 days, patient will: o Complete Shingrix vaccine series    Medication management . Pharmacist Clinical Goal(s): o Over the next 180 days, patient will work with PharmD and providers to maintain optimal medication adherence . [facility] . Interventions o Comprehensive medication review performed. o Utilize [facility] for medication synchronization, packaging and delivery . Patient self care activities - Over the next 180 days, patient will: o Focus on medication adherence by filling and taking medications appropriately  o Take medications as prescribed o Report any questions or concerns to PharmD and/or provider(s)  Initial goal documentation     .  Maintain current healthy lifestyle and independence (pt-stated)      .  Manage My Medicine        Follow Up Date 12/09/2020    - call for medicine refill 2 or 3  days before it runs out - keep a list of all the medicines I take; vitamins and herbals too    Why is this important?   These steps will help you keep on track with your medicines.    Notes: Verbal consent obtained for [facility] enhanced pharmacy services (medication synchronization, adherence packaging, delivery coordination). A medication sync plan was created to allow patient to get all medications delivered once every 30 to 90 days per patient preference. Patient understands they have freedom to choose pharmacy and clinical pharmacist will coordinate care between all prescribers and [facility].      Social Hx:  Husband died in 12/28/2005 Worked in [place] in [place].  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  Her son is an MD. 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  Son (6 children) is a MD in [place], [place]. He is a family practice MD. Has a daughter (3 children) who is a Regulatory affairs officer Statistician). Each of her grandchildren also have children. She is one of 5  children. Two sisters have passed away. Her mother was a single mom. Moved from [place], [place] to be near daughter.  Lives in a retirement community - [place] She likes to cook and try new receipes   Osteoporosis   Last DEXA Scan: 09/28/2017  T-Score femoral neck: -2.5 (R)  T-Score forearm radius: -1.7 (L)  VITD  Date Value Ref Range Status  01/02/2020 47.11 30.00 - 100.00 ng/mL Final     Patient is a candidate for pharmacologic treatment due to T-Score < -2.5 in femoral neck  Patient has failed these meds in past: alendronate (listed in allergies; extreme muscle aches noted in chart previously) Patient is currently uncontrolled on the following medications:  Marland Kitchen Vitamin D 2000 units daily   Reports she "took shots every 6 months, but it was so expensive". The cost of $250/injection was too expensive. She doesn't feel her  bones are getting worse. She reports being able to walk without any issue. Does upper body and lower body exercises almost daily.  Does not report eating any calcium containing foods except for cheese. She avoids dairy to "keep trim". Reports calcium supplements upset her stomach so she stopped taking them.  She is unsure if she ate before taking calcium supplements.   We discussed:  Recommend 1200 mg of calcium daily from dietary and supplemental sources. consideration of Prolia (cost may be limitation)  Plan -Consider repeat DEXA -Consider intake of 1200mg  of calcium daily through diet and/or supplementation (calcium citrate supplement) -If taking calcium supplements take after eating meal.   Hypothyroidism   Lab Results  Component Value Date/Time   TSH 3.34 01/02/2020 12:06 PM   TSH 3.43 06/26/2019 11:10 AM    Patient has failed these meds in past: None noted  Patient is currently controlled on the following medications:  . Euthyrox 1mcg 1 tab daily except 2 tabs on Tuesdays and Saturdays  Plan -Continue current medications  GERD   Patient has failed these meds in past: None noted  Patient is currently controlled on the following medications:  . Famotidine 20mg  daily as needed for heartburn . Omeprazole 20mg  once daily . Probiotic daily  Breakthrough Sx: None Breakthrough Tx: N/A Triggers: None identified   Plan -Continue current medications   Future Plan -Discuss possibility of tapering off of PPI if tolerated especially noting pt's hx of osteoporosis  Migraine    Patient has failed these meds in past: None noted  Patient is currently controlled on the following medications: . Acetaminophen 500mg  #2 every 6 hours as needed for pain . Excedrin Migraine (aspirin/acetaminophen/caffeine) 250/250/65mg  every 6 hours as needed  Only has migraines occassionally. Has 2-3 per year.  The excedrin migraine helps when she uses it. Feels some headaches are from eye  strain vs migraine  Plan -Continue current medications   Insomnia    Patient has failed these meds in past: None noted  Patient is currently controlled on the following medications: Marland Kitchen Melatonin 1mg  as needed   Uses about once every 2 months  Plan -Continue current medications   Vaccines   Reviewed and discussed patient's vaccination history.    Immunization History  Administered Date(s) Administered  . Fluad Quad(high Dose 65+) 06/15/2019  . Influenza Split 07/10/2011, 07/05/2012  . Influenza Whole 07/15/2010  . Influenza, High Dose Seasonal PF 06/29/2016, 06/29/2017, 06/13/2018  . Influenza,inj,Quad PF,6+ Mos 06/27/2013, 07/04/2014, 07/11/2015  . Moderna SARS-COVID-2 Vaccination 11/09/2019, 12/07/2019  . Pneumococcal Conjugate-13 07/14/2008  . Pneumococcal Polysaccharide-23 07/11/2015  . Td 09/15/2007  .  Tdap 06/22/2018  . Zoster 09/14/2006   Received her flu vaccine from [facility] pharmacy on [address] about a month ago.  Plan -Recommended patient receive Shingrix vaccine in pharmacy.   Medication Management   Pt uses [facility] for all medications Uses pill box? No - keeps them in the vials Pt endorses 100% compliance  We discussed: Discussed benefits of medication synchronization, packaging and delivery as well as enhanced pharmacist oversight with [facility].   Verbal consent obtained for [facility] enhanced pharmacy services (medication synchronization, adherence packaging, delivery coordination). A medication sync plan was created to allow patient to get all medications delivered once every 30 to 90 days per patient preference. Patient understands they have freedom to choose pharmacy and clinical pharmacist will coordinate care between all prescribers and [facility].   Miscellaneous Meds Vitamin B12 1000mg  daily Systane - PRN dry eyes Refresh eye drop - PRN (twice weekly) Acetaminophen/dextromethorphan/doxylamine - headaches or flu  symptoms Advil as needed (uses for cold and flu symptoms rarely) Allegra 180mg  daily (takes almost every [place]) Albuterol inhaler - PRN SOB a couple of times per year Fluticasone - PRN congestion at night occassionally  Turmeric 500mg  - PRN Renew Life Ultimate Flora Probiotic  Plan Utilize [facility] for medication synchronization, packaging and delivery    Follow up: 6 month phone visit  De Blanch, PharmD Clinical Pharmacist [facility] [phone]

## 2020-06-12 ENCOUNTER — Other Ambulatory Visit: Payer: Self-pay

## 2020-06-12 MED ORDER — OMEPRAZOLE 20 MG PO CPDR: 20.0000 mg | DELAYED_RELEASE_CAPSULE | Freq: Every day | ORAL | 1 refills | Status: DC

## 2020-06-12 MED ORDER — ALBUTEROL SULFATE HFA 108 (90 BASE) MCG/ACT IN AERS: 2.0000 | INHALATION_SPRAY | Freq: Four times a day (QID) | RESPIRATORY_TRACT | 3 refills | Status: DC | PRN

## 2020-06-12 MED ORDER — LEVOTHYROXINE SODIUM 25 MCG PO TABS: ORAL_TABLET | ORAL | 1 refills | Status: DC

## 2020-06-13 ENCOUNTER — Other Ambulatory Visit: Payer: Self-pay

## 2020-06-13 DIAGNOSIS — M810 Age-related osteoporosis without current pathological fracture: Secondary | ICD-10-CM

## 2020-06-13 DIAGNOSIS — E782 Mixed hyperlipidemia: Secondary | ICD-10-CM

## 2020-09-10 ENCOUNTER — Ambulatory Visit (INDEPENDENT_AMBULATORY_CARE_PROVIDER_SITE_OTHER): Payer: Medicare Other | Admitting: Family Medicine

## 2020-09-10 ENCOUNTER — Other Ambulatory Visit: Payer: Self-pay

## 2020-09-10 ENCOUNTER — Encounter: Payer: Self-pay | Admitting: Family Medicine

## 2020-09-10 VITALS — BP 110/62 | HR 80 | Temp 99.1°F | Ht 62.0 in | Wt 114.5 lb

## 2020-09-10 DIAGNOSIS — N3001 Acute cystitis with hematuria: Secondary | ICD-10-CM

## 2020-09-10 LAB — POC URINALSYSI DIPSTICK (AUTOMATED)
Bilirubin, UA: NEGATIVE
Blood, UA: POSITIVE
Glucose, UA: NEGATIVE
Ketones, UA: NEGATIVE
Nitrite, UA: NEGATIVE
Protein, UA: NEGATIVE
Spec Grav, UA: 1.01 (ref 1.010–1.025)
Urobilinogen, UA: 0.2 E.U./dL
pH, UA: 6 (ref 5.0–8.0)

## 2020-09-10 MED ORDER — CEPHALEXIN 500 MG PO CAPS: 500.0000 mg | ORAL_CAPSULE | Freq: Four times a day (QID) | ORAL | 0 refills | Status: AC

## 2020-09-10 MED ORDER — FLUCONAZOLE 150 MG PO TABS: ORAL_TABLET | ORAL | 0 refills | Status: DC

## 2020-09-10 NOTE — Patient Instructions (Signed)
Stay hydrated.   Warning signs/symptoms: Uncontrollable nausea/vomiting, fevers, worsening symptoms despite treatment, confusion.  Give us around 2 business days to get culture back to you.  Let us know if you need anything. 

## 2020-09-10 NOTE — Progress Notes (Signed)
Chief Complaint  Patient presents with  . Dysuria    Molly Williams is a 86 y.o. female here for possible UTI.  Duration: 3 days. Symptoms: Dysuria, urinary frequency and urgency Denies: hematuria, urinary hesitancy, urinary retention, fever, nausea, vomiting, flank pain, vaginal discharge Hx of recurrent UTI? No  Past Medical History:  Diagnosis Date  . Anemia 03/29/2015  . Arthritis   . Atrophic vaginitis   . Cholelithiasis 2008  . Fibrocystic breast disease    bx 2008 neg per pt report  . GERD (gastroesophageal reflux disease)   . Hearing loss   . Hearing loss sensory, bilateral    bil/hearing aids  . Hematuria    chronic Dr. Merry Lofty  . Hyperlipidemia 11/17/2016  . Lupus (systemic lupus erythematosus) (HCC)    Dr. Kellie Simmering  . Macular degeneration 11/17/2016  . Migraine aura without headache    h/o ocular migraines  . Osteopenia   . Preventative health care 11/18/2016  . Renal insufficiency 03/29/2015  . Retinal detachment with break    right/laser  . Retinal tear of right eye 11/17/2016  . Skin lesion of face 01/09/2016  . Spinal stenosis   . Thyroid disease    hypothyroidism  . Urinary tract bacterial infections    has had urethral dilations in past  . Vitamin B12 deficiency 01/09/2016  . Vitamin D deficiency 07/11/2015     BP 110/62 (BP Location: Left Arm, Patient Position: Sitting, Cuff Size: Normal)   Pulse 80   Temp 99.1 F (37.3 C) (Oral)   Ht 5\' 2"  (1.575 m)   Wt 114 lb 8 oz (51.9 kg)   SpO2 97%   BMI 20.94 kg/m  General: Awake, alert, appears stated age Heart: RRR Lungs: CTAB, normal respiratory effort, no accessory muscle usage Abd: BS+, soft, NT, ND, no masses or organomegaly MSK: No CVA tenderness, neg Lloyd's sign Psych: Age appropriate judgment and insight  Acute cystitis with hematuria - Plan: cephALEXin (KEFLEX) 500 MG capsule, fluconazole (DIFLUCAN) 150 MG tablet, POCT Urinalysis Dipstick (Automated), Urine Culture  UA suggestive of  infection w +blood and +LE. 7 d of Keflex, Diflucan should she develop yeast infection.  Stay hydrated. Seek immediate care if pt starts to develop fevers, new/worsening symptoms, uncontrollable N/V. F/u prn. The patient voiced understanding and agreement to the plan.  [place], DO 09/10/20 2:41 PM

## 2020-09-11 LAB — URINE CULTURE
MICRO NUMBER:: 11361987
Result:: NO GROWTH
SPECIMEN QUALITY:: ADEQUATE

## 2020-09-16 ENCOUNTER — Telehealth: Payer: Self-pay | Admitting: Pharmacist

## 2020-09-16 NOTE — Progress Notes (Addendum)
Chronic Care Management Pharmacy Assistant   Name: Molly Williams  MRN: [id number] DOB: 04-Jul-1931  Reason for Encounter: Medication Review   PCP : Bradd Canary, MD  Allergies:   Allergies  Allergen Reactions   Codeine Nausea And Vomiting and Nausea Only   Iodine Hives    Contrast dye   Alendronate    Bacitracin Swelling    Ophthalmic ointment   Iodinated Diagnostic Agents    Bactrim  [Sulfamethoxazole-Trimethoprim] Hives, Itching and Rash    Medications: Outpatient Encounter Medications as of 09/16/2020  Medication Sig Note   acetaminophen (TYLENOL) 500 MG tablet Take 1,000 mg by mouth every 6 (six) hours as needed. pain 12/18/2010: RARELY TAKES   albuterol (VENTOLIN HFA) 108 (90 Base) MCG/ACT inhaler Inhale 2 puffs into the lungs every 6 (six) hours as needed for wheezing or shortness of breath.    aspirin-acetaminophen-caffeine (EXCEDRIN MIGRAINE) 250-250-65 MG tablet Take by mouth every 6 (six) hours as needed for headache.    cephALEXin (KEFLEX) 500 MG capsule Take 1 capsule (500 mg total) by mouth 4 (four) times daily for 7 days.    Cholecalciferol (VITAMIN D) 2000 units CAPS Take 2,000 Units by mouth daily.    famotidine (PEPCID) 20 MG tablet Take 20 mg by mouth daily as needed for heartburn.     fluconazole (DIFLUCAN) 150 MG tablet Take 1 tab, repeat in 48 hours if no improvement.    levothyroxine (EUTHYROX) 25 MCG tablet TAKE 1 TABLET BY MOUTH ONCE DAILY EXCEPT  TAKE  2  TABLETS  ON  TUESDAYS  AND  SATURDAYS.    omeprazole (PRILOSEC) 20 MG capsule Take 1 capsule (20 mg total) by mouth daily.    Probiotic Product (PROBIOTIC DAILY PO) Take by mouth. 06/11/2020: Renew Life Ultimate [place] (uses occasionally)   No facility-administered encounter medications on file as of 09/16/2020.    Current Diagnosis: Patient Active Problem List   Diagnosis Date Noted   H/O colonoscopy with polypectomy 01/02/2020   Migraine 06/26/2019   Low back pain 01/13/2018   Muscle cramps  01/13/2018   Preventative health care 11/18/2016   Hyperlipidemia 11/17/2016   Atherosclerosis of aorta (HCC) 11/17/2016   Palpitations 11/17/2016   Retinal tear of right eye 11/17/2016   Macular degeneration 11/17/2016   Skin lesion of face 01/09/2016   Vitamin B12 deficiency 01/09/2016   Vitamin D deficiency 07/11/2015   Renal insufficiency 03/29/2015   Anemia 03/29/2015   Hyperglycemia 01/30/2015   Osteoporosis 09/27/2013   Insomnia 09/27/2013   Allergic rhinitis 12/26/2012   GERD (gastroesophageal reflux disease) 04/21/2011   Thyroid disease    Arthritis    Urinary tract bacterial infections    Atrophic vaginitis    Spinal stenosis    Fibrocystic breast disease    Lupus (systemic lupus erythematosus) (HCC)    Hearing loss sensory, bilateral     Goals Addressed   None    Reviewed chart for medication changes ahead of medication coordination call.  Office Visits: 09-10-2020 (Fam Med) Patient presented in the office for possible UTI. Patient reports dysuria, urinary frequency and urgency for three days. Was prescribed Keflex 500 mg for seven days and Diflucan 150 mg PRN for yeast infections.  No Consults, or hospital visits since last care coordination call/Pharmacist visit.   Medication changes indicated:  BP Readings from Last 3 Encounters:  09/10/20 110/62  03/15/20 (!) 124/64  03/12/20 140/72    Lab Results  Component Value Date   HGBA1C 5.4 01/02/2020  Patient obtains medications through Vials  90 Days   This is the patient's first adherence delivery.  Patient is due for next adherence delivery on: 09-18-2020. Called patient and reviewed medications and coordinated delivery.  This delivery to include: Omeprazole 20 mg tab Levothyroxine 25 mcg  Coordinated acute fill for (med) to be delivered (date).  Patient declined the following medications: Albuterol Inhaler (PRN use; enough on hand)  Patient does not need refills at this time.  During the  call the patient stated she did not remember having her medication sent to [facility] and would prefer to stay with [facility] for her medication needs. I attempted to review the visit with Ms. Askeland in September with the clinical pharmacist, but she was not willing to change her pharmacy at this time. She did like the delivery benefit, but still would like to stay with [facility].  Follow-Up:  Pharmacist Review   Fanny Skates, [place] Pharmacist Assistant [phone]  Reviewed by: De Blanch, PharmD, BCACP Clinical Pharmacist [facility] [phone]

## 2020-10-10 IMAGING — MG DIGITAL DIAGNOSTIC UNILATERAL RIGHT MAMMOGRAM
4 series · 4 of 4 positions shown · non-contrast
Comparison: 08/01/2018 and earlier

CLINICAL DATA: The patient returns after screening study for
evaluation of RIGHT breast calcifications.

EXAM:
DIGITAL DIAGNOSTIC RIGHT MAMMOGRAM WITH CAD

[R ML (1 of 3)]
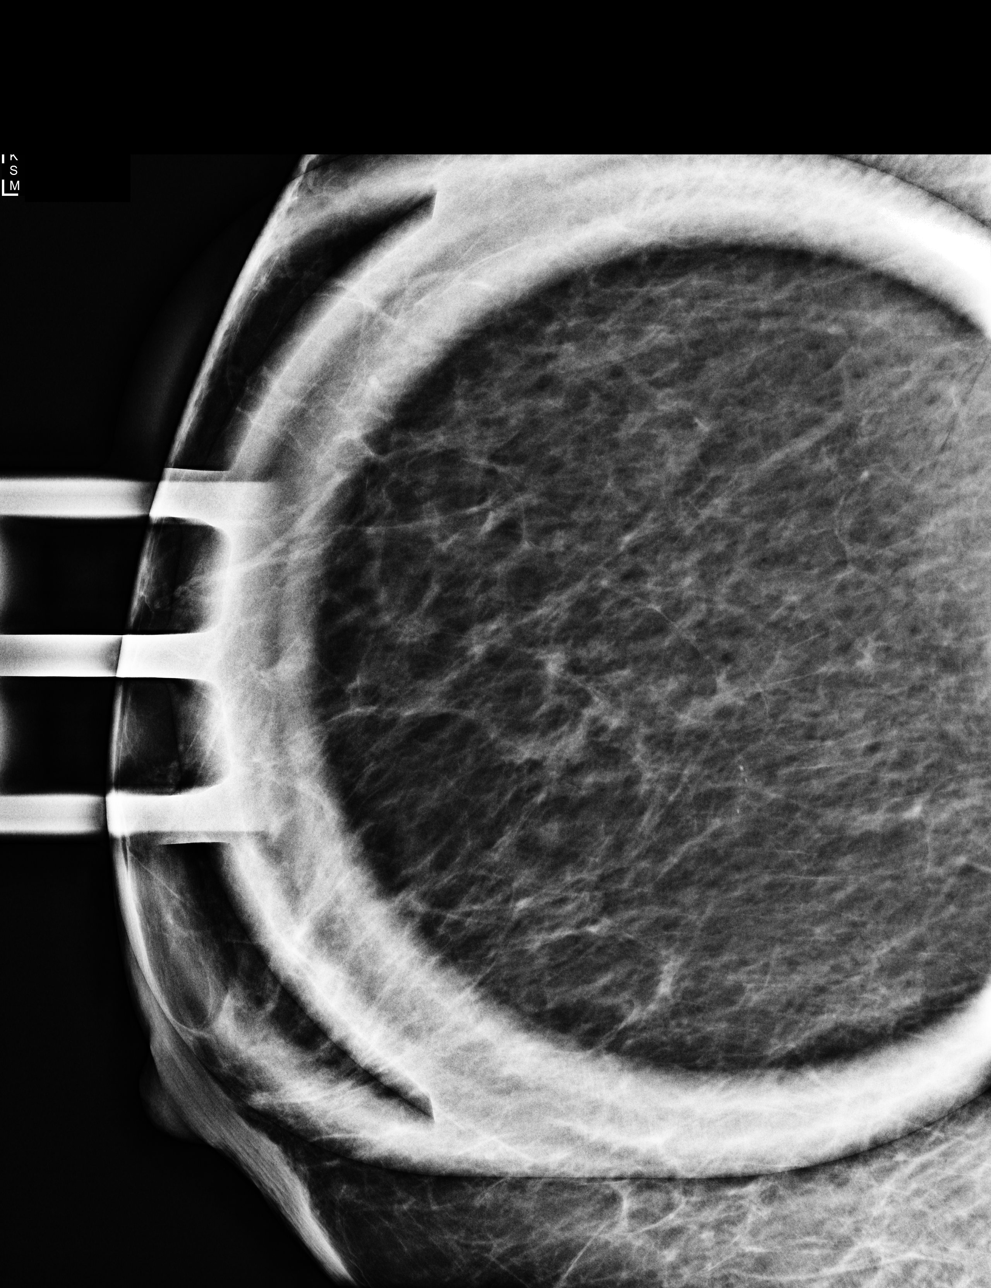

[R ML (2 of 3)]
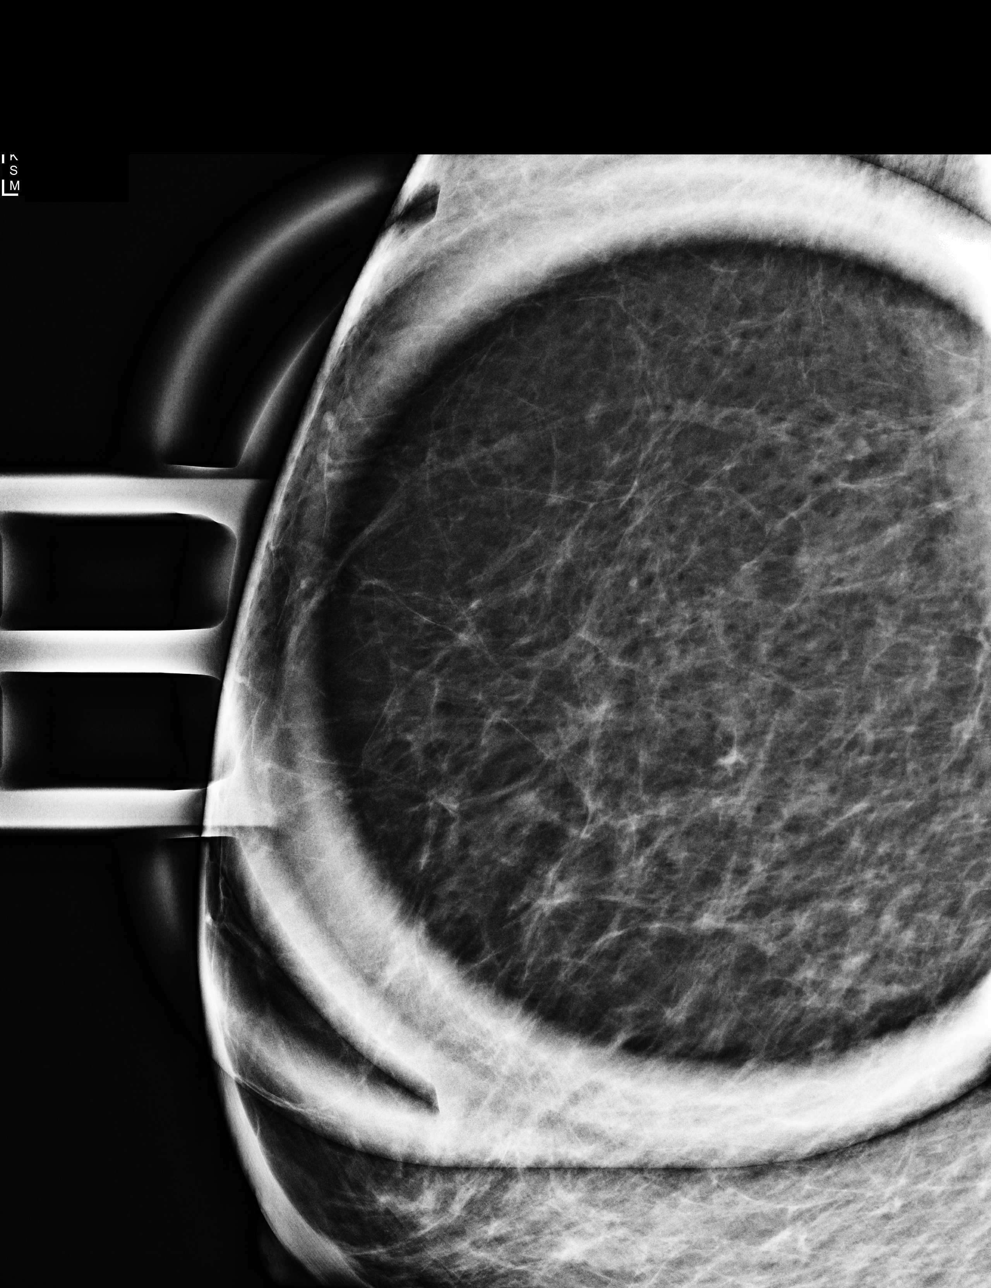

[R CC]
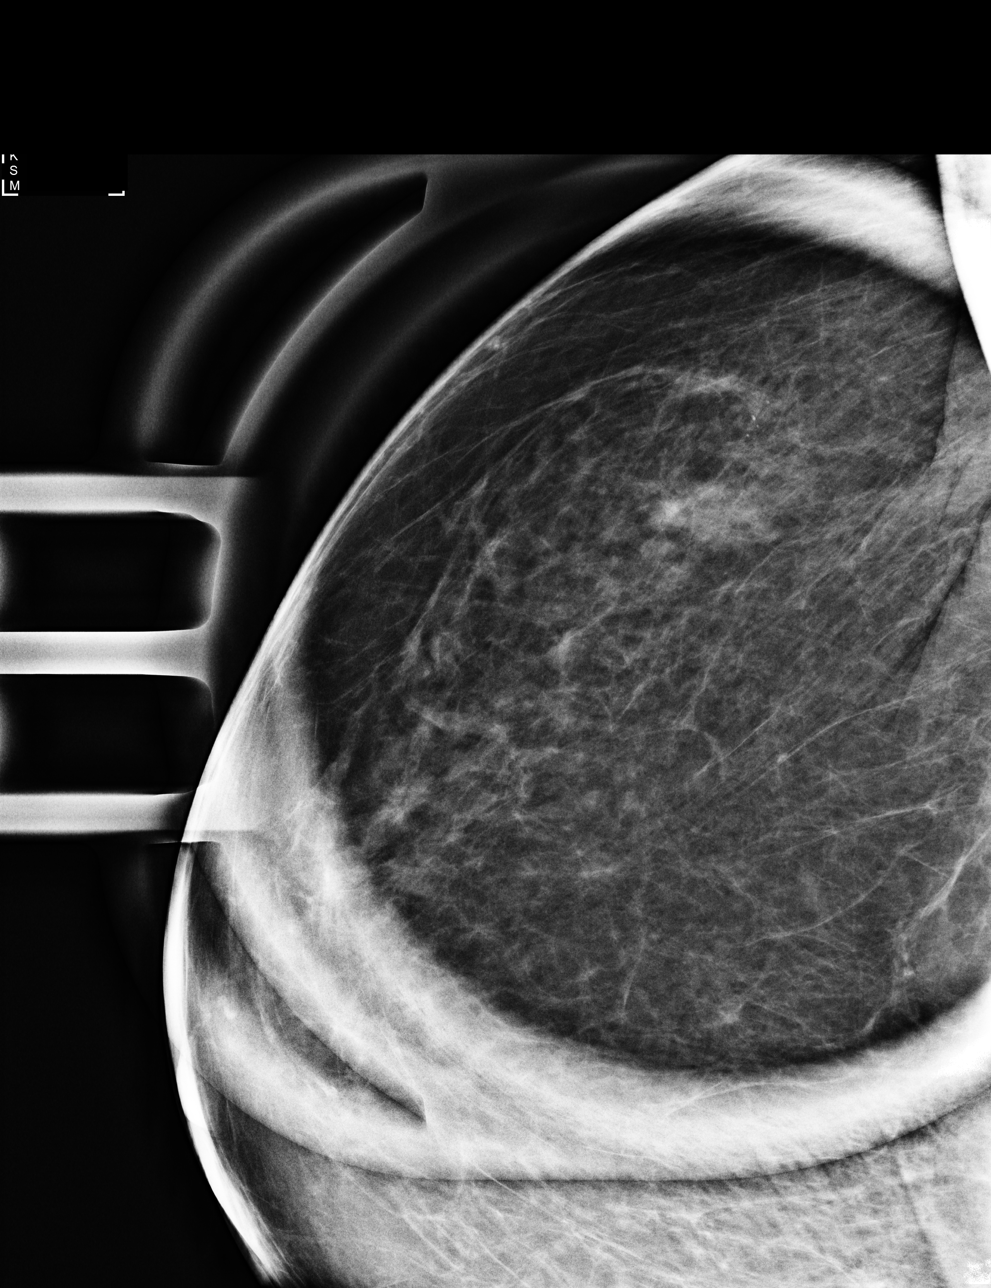

[R ML (3 of 3)]
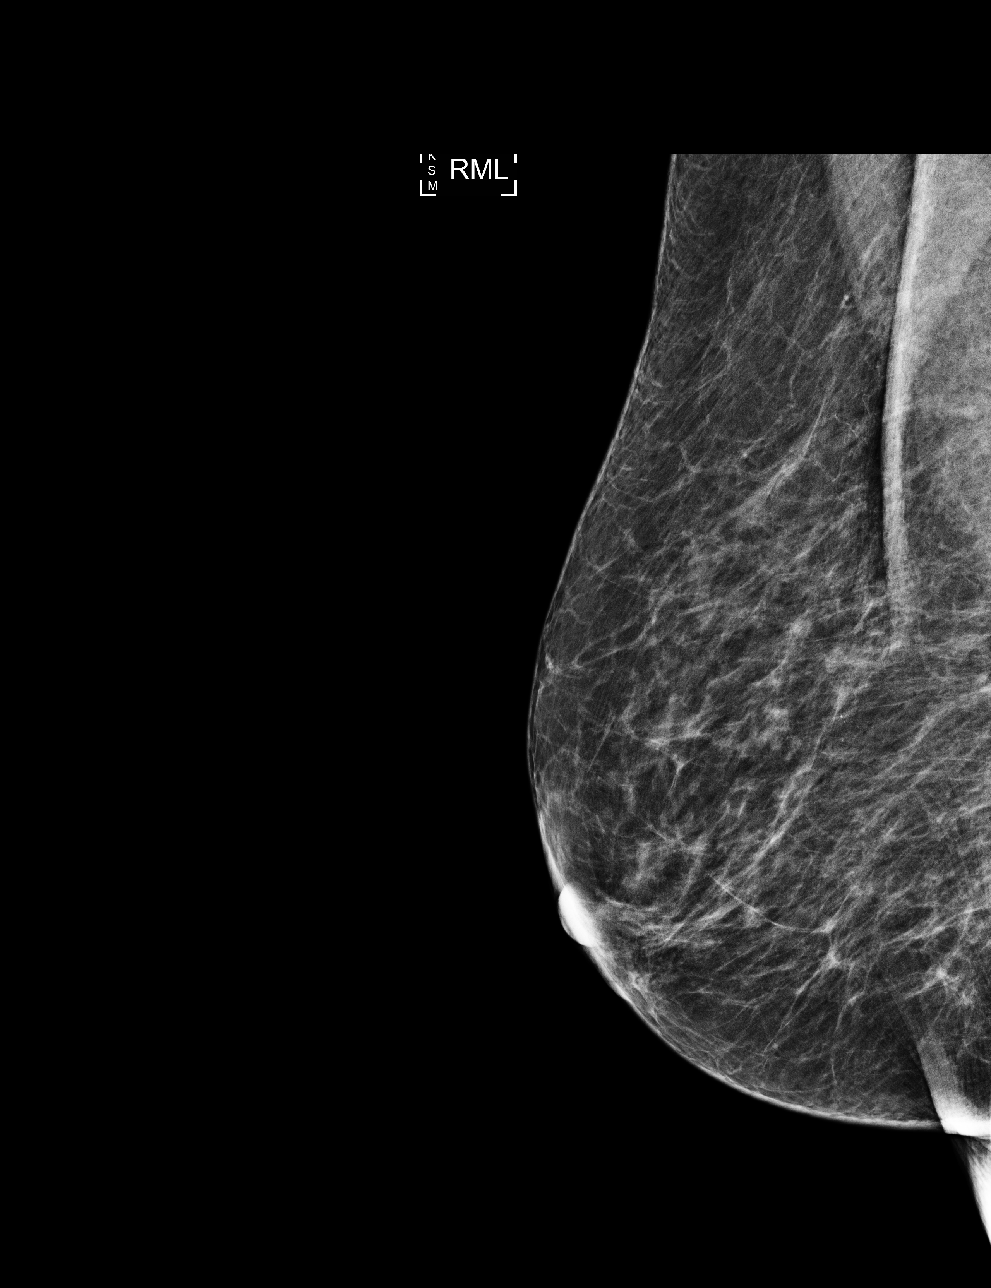

[4 of 4 positions shown; findings below may reference images not displayed]

ACR Breast Density Category b: There are scattered areas of
fibroglandular density.
FINDINGS: Magnified views are performed of calcifications in the UPPER-OUTER
QUADRANT of the RIGHT breast. On magnified views there is a small
group of fine pleomorphic calcifications, measuring 5 millimeters in
diameter.

Mammographic images were processed with CAD.
IMPRESSION: Indeterminate RIGHT breast calcifications for which biopsy is
indicated.

RECOMMENDATION:
Stereotactic guided core biopsy of RIGHT breast calcifications.

I have discussed the findings and recommendations with the patient.
Results were also provided in writing at the conclusion of the
visit. If applicable, a reminder letter will be sent to the patient
regarding the next appointment.

BI-RADS CATEGORY  4: Suspicious.

## 2020-10-11 ENCOUNTER — Other Ambulatory Visit (HOSPITAL_BASED_OUTPATIENT_CLINIC_OR_DEPARTMENT_OTHER): Payer: Self-pay | Admitting: Family Medicine

## 2020-10-11 DIAGNOSIS — Z1231 Encounter for screening mammogram for malignant neoplasm of breast: Secondary | ICD-10-CM

## 2020-10-22 ENCOUNTER — Ambulatory Visit (HOSPITAL_BASED_OUTPATIENT_CLINIC_OR_DEPARTMENT_OTHER): Payer: Medicare Other

## 2020-12-09 ENCOUNTER — Other Ambulatory Visit: Payer: Self-pay | Admitting: Family Medicine

## 2020-12-09 ENCOUNTER — Ambulatory Visit (INDEPENDENT_AMBULATORY_CARE_PROVIDER_SITE_OTHER): Payer: Medicare Other | Admitting: Pharmacist

## 2020-12-09 DIAGNOSIS — K219 Gastro-esophageal reflux disease without esophagitis: Secondary | ICD-10-CM

## 2020-12-09 DIAGNOSIS — M810 Age-related osteoporosis without current pathological fracture: Secondary | ICD-10-CM

## 2020-12-09 DIAGNOSIS — J302 Other seasonal allergic rhinitis: Secondary | ICD-10-CM

## 2020-12-09 DIAGNOSIS — M1990 Unspecified osteoarthritis, unspecified site: Secondary | ICD-10-CM | POA: Diagnosis not present

## 2020-12-09 DIAGNOSIS — E079 Disorder of thyroid, unspecified: Secondary | ICD-10-CM

## 2020-12-09 MED ORDER — ALBUTEROL SULFATE HFA 108 (90 BASE) MCG/ACT IN AERS: 2.0000 | INHALATION_SPRAY | Freq: Four times a day (QID) | RESPIRATORY_TRACT | 3 refills | Status: AC | PRN

## 2020-12-09 NOTE — Patient Instructions (Addendum)
Visit Information  PATIENT GOALS: Goals Addressed            This Visit's Progress   . Chronic Care Management Pharmacy Care Plan   On track    CARE PLAN ENTRY (see longitudinal plan of care for additional care plan information)  Current Barriers:  . Chronic Disease Management support, education, and care coordination needs related to Osteoporosis, Hypothyroidism, GERD, Migraine, Insomnia   Osteoporosis . Pharmacist Clinical Goal(s) o Over the next 180 days, patient will work with PharmD and providers to reduce risk of fracture due to osteoporosis . Current regimen:  o Vitamin D 2000 units daily . Interventions: o Discussed intake of 1200mg  of calcium daily through diet and/or supplementation o Discussed that calcium supplements should be taken with or after meals.  o Since patient is using PPI, she would benefit from using a calcium citrate supplement . Patient self care activities - Over the next 180 days, patient will: o Consider intake of 1200mg  of calcium daily through diet and/or supplementation o Consider rechecking DEXA  Hypothyroidism . Pharmacist Clinical Goal(s) o Over the next 180 days, patient will work with PharmD and providers to reduce symptoms associated with hypothyroidism . Current regimen:  o Euthyrox 24mcg 1 tablet daily except 2 tablets on Tuesdays and Saturdays . Patient self care activities - Over the next 180 days, patient will: o Maintain hypothyroidism medication regimen o  Acid Reflux . Pharmacist Clinical Goal(s) o Over the next 180 days, patient will work with PharmD and providers to ensure continued control of acid reflux . Current regimen:  o Omeprazole 20mg  daily o Famotidine 20mg  daily if needed for breakthrough acid reflux . Interventions: o Discussed symptoms of reflux including increase in breathing difficulties / exacerbation of shortness of breath . Patient self care activities - Over the next 180 days, patient will: o Continue  current regimen.   Seasonal Allergies (with difficulty breathing) . Pharmacist Clinical Goal(s) o Over the next 180 days, patient will work with PharmD and providers to ensure control of allergy symptoms . Current regimen:  o Albuterol / Proventil as needed for shortness of breath / wheezing (uses only 4 to 5 times per year . Interventions: o Current inhaler is out of date; requested updated Rx from PCP . Patient self care activities - Over the next 180 days, patient will: o Continue current regimen.   Health Maintenance  . Pharmacist Clinical Goal(s) o Over the next 180 days, patient will work with PharmD and providers to complete health maintenance screenings/vaccinations . Interventions: o Discussed completing Shingrix vaccine series. Patient would like to discuss further with Dr Charlett Blake.  . Patient self care activities - Over the next 180 days, patient will: o Complete Shingrix vaccine series (discussed getting downstairs at pharmacy after next office visit.    Medication management . Pharmacist Clinical Goal(s): o Over the next 180 days, patient will work with PharmD and providers to maintain optimal medication adherence . Current pharmacy: [facility] switched to [facility] . Interventions o Comprehensive medication review performed. . Patient self care activities - Over the next 180 days, patient will: o Focus on medication adherence by filling and taking medications appropriately  o Take medications as prescribed o Report any questions or concerns to PharmD and/or provider(s)  Initial goal documentation     . Manage My Medicine   On track    Follow Up Date 12/09/2020    - call for medicine refill 2 or 3 days before it runs out -  keep a list of all the medicines I take; vitamins and herbals too    Why is this important?   These steps will help you keep on track with your medicines.    Notes: Verbal consent obtained for [facility] enhanced pharmacy services (medication  synchronization, adherence packaging, delivery coordination). A medication sync plan was created to allow patient to get all medications delivered once every 30 to 90 days per patient preference. Patient understands they have freedom to choose pharmacy and clinical pharmacist will coordinate care between all prescribers and [facility].        The patient verbalized understanding of instructions, educational materials, and care plan provided today and declined offer to receive copy of patient instructions, educational materials, and care plan.   Telephone follow up appointment with care management team member scheduled for: 6 months  Cherre Robins, PharmD Clinical Pharmacist [place] [phone]

## 2020-12-09 NOTE — Chronic Care Management (AMB) (Signed)
Chronic Care Management Pharmacy Note  12/09/2020 Name:  Molly Williams MRN:  [id number] DOB:  [birth year]  Subjective: Molly Williams is an 87 y.o. year old female who is a primary patient of Mosie Lukes, MD.  The CCM team was consulted for assistance with disease management and care coordination needs.    Engaged with patient by telephone for follow up visit in response to provider referral for pharmacy case management and/or care coordination services.   Consent to Services:  The patient was given information about Chronic Care Management services, agreed to services, and gave verbal consent prior to initiation of services.  Please see initial visit note for detailed documentation.   Patient Care Team: Mosie Lukes, MD as PCP - General (Family Medicine) Renda Rolls, Jennefer Bravo, MD as Referring Physician (Dermatology) Cherre Robins, PharmD (Pharmacist)  09/10/2020 - Acute visit with Dr Nani Ravens for Acute cystitis with hematuria. UA suggestive of infection w +blood and +LE. Prescribed cephalexin 561m x 7 days and fluconazole 1571mx 1 should she develop yeast infection.  Recent consult visits: None in previous 6 months  Hospital visits: None in previous 6 months  Objective:  Lab Results  Component Value Date   CREATININE 1.18 03/14/2020   CREATININE 0.87 02/02/2020   CREATININE 0.83 01/02/2020    Lab Results  Component Value Date   HGBA1C 5.4 01/02/2020       Component Value Date/Time   CHOL 239 (H) 01/02/2020 1206   TRIG 82.0 01/02/2020 1206   HDL 108.30 01/02/2020 1206   CHOLHDL 2 01/02/2020 1206   VLDL 16.4 01/02/2020 1206   LDLCALC 115 (H) 01/02/2020 1206    Hepatic Function Latest Ref Rng & Units 03/14/2020 02/02/2020 01/02/2020  Total Protein 6.0 - 8.3 g/dL 6.9 6.7 7.1  Albumin 3.5 - 5.2 g/dL 4.2 4.1 4.3  AST 0 - 37 U/L '17 17 17  ' ALT 0 - 35 U/L '11 12 10  ' Alk Phosphatase 39 - 117 U/L 46 49 48  Total Bilirubin 0.2 - 1.2 mg/dL 0.3 0.6 0.5   Bilirubin, Direct 0.0 - 0.3 mg/dL - - -    Lab Results  Component Value Date/Time   TSH 3.34 01/02/2020 12:06 PM   TSH 3.43 06/26/2019 11:10 AM    CBC Latest Ref Rng & Units 01/02/2020 06/26/2019 07/22/2018  WBC 4.0 - 10.5 K/uL 6.4 7.8 6.3  Hemoglobin 12.0 - 15.0 g/dL 12.8 12.7 12.6  Hematocrit 36.0 - 46.0 % 38.1 37.3 37.2  Platelets 150.0 - 400.0 K/uL 345.0 335.0 336.0    Lab Results  Component Value Date/Time   VD25OH 47.11 01/02/2020 12:06 PM   VD25OH 42.69 06/26/2019 11:10 AM    Clinical ASCVD: No  The ASCVD Risk score (GMikey BussingC Jr., et al., 2013) failed to calculate for the following reasons:   The 2013 ASCVD risk score is only valid for ages 4019o 7912  Other: DEXA 09/28/2017 T-Score at femoral neck = -2.5 T-Score at left forearm = -1.7  Social History   Tobacco Use  Smoking Status Former Smoker  . Quit date: 1987. Years since quitting: 52.2  Smokeless Tobacco Never Used   BP Readings from Last 3 Encounters:  09/10/20 110/62  03/15/20 (!) 124/64  03/12/20 140/72   Pulse Readings from Last 3 Encounters:  09/10/20 80  03/15/20 64  03/12/20 65   Wt Readings from Last 3 Encounters:  09/10/20 114 lb 8 oz (51.9 kg)  03/15/20 114 lb (  51.7 kg)  03/12/20 115 lb (52.2 kg)    Assessment: Review of patient past medical history, allergies, medications, health status, including review of consultants reports, laboratory and other test data, was performed as part of comprehensive evaluation and provision of chronic care management services.   SDOH:  (Social Determinants of Health) assessments and interventions performed:  SDOH Interventions   Flowsheet Row Most Recent Value  SDOH Interventions   Physical Activity Interventions Intervention Not Indicated      CCM Care Plan  Allergies  Allergen Reactions  . Codeine Nausea And Vomiting and Nausea Only  . Iodine Hives    Contrast dye  . Alendronate   . Bacitracin Swelling    Ophthalmic ointment  .  Iodinated Diagnostic Agents   . Bactrim  [Sulfamethoxazole-Trimethoprim] Hives, Itching and Rash    Medications Reviewed Today    Reviewed by Shelda Pal, DO (Physician) on 09/10/20 at 1414  Med List Status: <None>  Medication Order Taking? Sig Documenting Provider Last Dose Status Informant  acetaminophen (TYLENOL) 500 MG tablet [id number] Yes Take 1,000 mg by mouth every 6 (six) hours as needed. pain [provider] Taking Active            Med Note Roxy Manns, KELLY   Thu Dec 18, 2010  8:25 AM) RARELY TAKES  albuterol (VENTOLIN HFA) 108 (90 Base) MCG/ACT inhaler [id number] Yes Inhale 2 puffs into the lungs every 6 (six) hours as needed for wheezing or shortness of breath. Mosie Lukes, MD Taking Active   aspirin-acetaminophen-caffeine [facility] MIGRAINE) [phone] MG tablet [id number] Yes Take by mouth every 6 (six) hours as needed for headache. [provider] Taking Active   Cholecalciferol (VITAMIN D) 2000 units CAPS [id number] Yes Take 2,000 Units by mouth daily. [provider] Taking Active   Cyanocobalamin (VITAMIN B 12 PO) [id number]  Take 1,000 mg by mouth daily.  Patient not taking: Reported on 06/11/2020   [provider]  Active   famotidine (PEPCID) 20 MG tablet [id number] Yes Take 20 mg by mouth daily as needed for heartburn.  [provider] Taking Active   levothyroxine (EUTHYROX) 25 MCG tablet [id number] Yes TAKE 1 TABLET BY MOUTH ONCE DAILY EXCEPT  TAKE  2  TABLETS  ON  TUESDAYS  AND  SATURDAYS. Mosie Lukes, MD Taking Active   omeprazole (PRILOSEC) 20 MG capsule [id number] Yes Take 1 capsule (20 mg total) by mouth daily. Mosie Lukes, MD Taking Active   Probiotic Product (PROBIOTIC DAILY PO) [id number] Yes Take by mouth. [provider] Taking Active            Med Note Elana Alm, Melvenia Beam   Tue Jun 11, 2020  9:22 AM) Renew Life Ultimate Dianah Field (uses occasionally)          Patient Active Problem List   Diagnosis  Date Noted  . H/O colonoscopy with polypectomy 01/02/2020  . Migraine 06/26/2019  . Low back pain 01/13/2018  . Muscle cramps 01/13/2018  . Preventative health care 11/18/2016  . Hyperlipidemia 11/17/2016  . Atherosclerosis of aorta ([place]) 11/17/2016  . Palpitations 11/17/2016  . Retinal tear of right eye 11/17/2016  . Macular degeneration 11/17/2016  . Skin lesion of face 01/09/2016  . Vitamin B12 deficiency 01/09/2016  . Vitamin D deficiency 07/11/2015  . Renal insufficiency 03/29/2015  . Anemia 03/29/2015  . Hyperglycemia 01/30/2015  . Osteoporosis 09/27/2013  . Insomnia 09/27/2013  . Allergic rhinitis 12/26/2012  . GERD (gastroesophageal reflux disease) 04/21/2011  . Thyroid  disease   . Arthritis   . Urinary tract bacterial infections   . Atrophic vaginitis   . Spinal stenosis   . Fibrocystic breast disease   . Lupus (systemic lupus erythematosus) ([place])   . Hearing loss sensory, bilateral     Immunization History  Administered Date(s) Administered  . Fluad Quad(high Dose 65+) 06/15/2019  . Influenza Split 07/10/2011, 07/05/2012  . Influenza Whole 07/15/2010  . Influenza, High Dose Seasonal PF 06/29/2016, 06/29/2017, 06/13/2018, 10/18/2020  . Influenza,inj,Quad PF,6+ Mos 06/27/2013, 07/04/2014, 07/11/2015  . Moderna Sars-Covid-2 Vaccination 11/09/2019, 12/07/2019  . Pneumococcal Conjugate-13 07/14/2008  . Pneumococcal Polysaccharide-23 07/11/2015  . Td 09/15/2007  . Tdap 06/22/2018  . Zoster 09/14/2006    Conditions to be addressed/monitored: osteoporosis; GERD; thyroid disease (hypothyroidism); insomonia  Care Plan : Wellness (Adult)  Updates made by Cherre Robins, PHARMD since 12/09/2020 12:00 AM    Problem: Medication Adherence (Wellness)   Note:   Reviewed indication and associated labs with each medication.  Pt stated adherence to medication regimen.   Goal: Medication Adherence Maintained   Note:    Developed a complete and accurate medication list  including those prescribed and over-the-counter, those taken only occasionally and those not taken by mouth such as injections, inhalers, ointments or creams and drops.   Review edall medications to determine if patient or caregiver knows why the medications are given and if taken as prescribed.   Completed or review a medication adherence assessment including barriers to medication adherence.    Assessed barriers to medication adherence.    Assessed presence of side effects; provide suggestions to manage or reduce side effects.      Care Plan : General Pharmacy (Adult)  Updates made by Cherre Robins, PHARMD since 12/09/2020 12:00 AM    Problem: Disease Progression (Osteoporosis)   Note:   Current Barriers:  . Patient intolerance to alendronate and stopped Prolia (due to cost per chart records but patient states she does not remember not being able to afford Prolia)  Pharmacist Clinical Goal(s):  Marland Kitchen Over the next 180 days, patient will  avoid fractures through taking calcium and vitamin D. Will  through collaboration with PharmD and provider will assess need for repeat DEXA and future pharmacologic therapy.  Interventions: . 1:1 collaboration with Mosie Lukes, MD regarding development and update of comprehensive plan of care as evidenced by provider attestation and co-signature . Comprehensive medication review performed; medication list updated in electronic medical record  Osteoporosis: . Current treatment: none . Last DEXA: 09/28/2017, t score -2.5 at femoral neck . Supplementation: vitamin D 2000 IU daily  Patient Goals/Self-Care Activities . Over the next 180 days, patient will:  Continue to take vitamin D 2000IU daily Consider adding calcium supplementation up to 1232m daily through diet and / or supplementation Consider repeat DEXA and possibly Prolia restart if needed.   Follow Up Plan: Telephone follow up appointment with care management team member scheduled for:  6  months and The patient has been provided with contact information for the care management team and has been advised to call with any health related questions or concerns.       Problem: CHL AMB hypothyrodism   Priority: Low  Note:    Pharmacist Clinical Goal(s):  .Marland KitchenOver the next 180 days, patient will maintain control of thyroid disease as evidenced by TSH / thyroid labs being at goal  through collaboration with PharmD and provider.   Interventions: . Reviewed dosing scheduled for current Euthyroxine 222m tablets -  take 1 tablet daily except 2 tablets on Tuesdays and Saturdays.   Patient Goals/Self-Care Activities . Over the next 180 days, patient will:  Continue current regimen      Acid Reflux . Pharmacist Clinical Goal(s) o Over the next 180 days, patient will work with PharmD and providers to ensure continued control of acid reflux . Current regimen:  o Omeprazole 70m daily o Famotidine 243mdaily if needed for breakthrough acid reflux . Interventions: o Discussed symptoms of reflux including increase in breathing difficulties / exacerbation of shortness of breath . Patient self care activities - Over the next 180 days, patient will: o Continue current regimen.   Seasonal Allergies (with difficulty breathing) . Pharmacist Clinical Goal(s) o Over the next 180 days, patient will work with PharmD and providers to ensure control of allergy symptoms . Current regimen:  o Albuterol / Proventil as needed for shortness of breath / wheezing (uses only 4 to 5 times per year . Interventions: o Current inhaler is out of date; requested updated Rx from PCP . Patient self care activities - Over the next 180 days, patient will: o Continue current regimen.   Health Maintenance  . Pharmacist Clinical Goal(s) o Over the next 180 days, patient will work with PharmD and providers to complete health maintenance screenings/vaccinations . Interventions: Discussed completing Shingrix  vaccine series. Patient would like to discuss  Medication Assistance: None required.  Patient affirms current coverage meets needs.  [facility]  [id number]: [phone]: [phone] Up:  Patient agrees to Care Plan and Follow-up.  Plan:   Consider rechecking DEXA and possibly restart Prolia if significant T-Score decreased.   Requested refill for Proventil / albuterol inhaler for patient (uses occasionally during allergy season for wheezing / SOB).   Encouraged her ot get Shingrix vaccine. She states she will discuss with Dr BlCharlett Blaket next office visit. [facility] get at [facility] or [facility] if she decides to get Shingrix.    Telephone follow up appointment with care management team member scheduled for:  6 months  TaCherre RobinsPharmD Clinical Pharmacist [facility]

## 2020-12-12 ENCOUNTER — Telehealth: Payer: Self-pay | Admitting: Family Medicine

## 2020-12-12 ENCOUNTER — Other Ambulatory Visit: Payer: Self-pay

## 2020-12-12 DIAGNOSIS — E079 Disorder of thyroid, unspecified: Secondary | ICD-10-CM

## 2020-12-12 MED ORDER — LEVOTHYROXINE SODIUM 25 MCG PO TABS: ORAL_TABLET | ORAL | 1 refills | Status: DC

## 2020-12-12 NOTE — Telephone Encounter (Signed)
Medication sent in. 

## 2020-12-12 NOTE — Telephone Encounter (Signed)
Medication:  levothyroxine (EUTHYROX) 25 MCG tablet [[id number]]   Has the patient contacted their pharmacy? No. (If no, request that the patient contact the pharmacy for the refill.) (If yes, when and what did the pharmacy advise?)  Preferred Pharmacy (with phone number or street name):  [facility], [place]. Phone:  [phone]  Fax:  [phone]       Agent: Please be advised that RX refills may take up to 3 business days. We ask that you follow-up with your pharmacy.

## 2021-01-29 ENCOUNTER — Encounter: Payer: Self-pay | Admitting: Family Medicine

## 2021-02-16 ENCOUNTER — Other Ambulatory Visit: Payer: Self-pay | Admitting: Family Medicine

## 2021-02-16 DIAGNOSIS — E079 Disorder of thyroid, unspecified: Secondary | ICD-10-CM

## 2021-03-12 NOTE — Progress Notes (Deleted)
Subjective:   RAJNI HOLSWORTH is a 87 y.o. female who presents for Medicare Annual (Subsequent) preventive examination.  Review of Systems    ***       Objective:    There were no vitals filed for this visit. There is no height or weight on file to calculate BMI.  Advanced Directives 03/12/2020 08/09/2018 09/28/2017 10/09/2016 09/22/2014  Does Patient Have a Medical Advance Directive? Yes Yes Yes Yes No  Type of Paramedic of [place];Living will - [place];Living will [place];Living will -  Does patient want to make changes to medical advance directive? No - Patient declined No - Patient declined - - -  Copy of [place] in Chart? Yes - validated most recent copy scanned in chart (See row information) - Yes No - copy requested -  Would patient like information on creating a medical advance directive? - - - - No - patient declined information    Current Medications (verified) Outpatient Encounter Medications as of 03/13/2021  Medication Sig   acetaminophen (TYLENOL) 500 MG tablet Take 1,000 mg by mouth every 6 (six) hours as needed. pain   albuterol (VENTOLIN HFA) 108 (90 Base) MCG/ACT inhaler Inhale 2 puffs into the lungs every 6 (six) hours as needed for wheezing or shortness of breath.   aspirin-acetaminophen-caffeine (EXCEDRIN MIGRAINE) 250-250-65 MG tablet Take by mouth every 6 (six) hours as needed for headache.   Cholecalciferol (VITAMIN D) 2000 units CAPS Take 2,000 Units by mouth daily.   EUTHYROX 25 MCG tablet TAKE 1 TABLET BY MOUTH ONCE DAILY EXCEPT  TAKE  2  TABLETS  BY  MOUTH  ON  TUESDAYS  AND  SATURDAYS   famotidine (PEPCID) 20 MG tablet Take 20 mg by mouth daily as needed for heartburn.    omeprazole (PRILOSEC) 20 MG capsule Take 1 capsule (20 mg total) by mouth daily.   Turmeric (QC TUMERIC COMPLEX) 500 MG CAPS Take 1 capsule by mouth daily.   No facility-administered encounter  medications on file as of 03/13/2021.    Allergies (verified) Codeine, Iodine, Alendronate, Bacitracin, Iodinated diagnostic agents, and Bactrim  [sulfamethoxazole-trimethoprim]   History: Past Medical History:  Diagnosis Date   Anemia 03/29/2015   Arthritis    Atrophic vaginitis    Cholelithiasis 2008   Fibrocystic breast disease    bx 2008 neg per pt report   GERD (gastroesophageal reflux disease)    Hearing loss    Hearing loss sensory, bilateral    bil/hearing aids   Hematuria    chronic Dr. Burnell Blanks   Hyperlipidemia 11/17/2016   Lupus (systemic lupus erythematosus) ([place])    Dr. Charlestine Night   Macular degeneration 11/17/2016   Migraine aura without headache    h/o ocular migraines   Osteopenia    Preventative health care 11/18/2016   Renal insufficiency 03/29/2015   Retinal detachment with break    right/laser   Retinal tear of right eye 11/17/2016   Skin lesion of face 01/09/2016   Spinal stenosis    Thyroid disease    hypothyroidism   Urinary tract bacterial infections    has had urethral dilations in past   Vitamin B12 deficiency 01/09/2016   Vitamin D deficiency 07/11/2015   Past Surgical History:  Procedure Laterality Date   APPENDECTOMY  2006   CATARACT EXTRACTION W/ INTRAOCULAR LENS  IMPLANT, BILATERAL     CHOLECYSTECTOMY     EYE SURGERY Bilateral    cataracts  TONSILLECTOMY  1940   VAGINAL HYSTERECTOMY     partial   Family History  Problem Relation Age of Onset   Stroke Paternal Grandmother    Stroke Father    Coronary artery disease Father    Coronary artery disease Mother    Stroke Mother    Cancer Paternal Aunt        GYN cancer   Cancer Paternal Uncle        lung, smoker   Stroke Maternal Grandmother    Stroke Maternal Grandfather    Heart disease Paternal Grandfather    Hydrocephalus Brother        normopressure hydrocephalus, dementia   Social History   Socioeconomic History   Marital status: Widowed    Spouse name: Not on file   Number of  children: Not on file   Years of education: Not on file   Highest education level: Not on file  Occupational History   Not on file  Tobacco Use   Smoking status: Former    Pack years: 0.00    Types: Cigarettes    Quit date: 1970    Years since quitting: 52.5   Smokeless tobacco: Never  Substance and Sexual Activity   Alcohol use: Yes    Alcohol/week: 10.0 standard drinks    Types: 10 Glasses of wine per week   Drug use: No   Sexual activity: Not Currently    Comment: lives alone here because her daughter lives hier, no dietary restrictions, avoids dairy  Other Topics Concern   Not on file  Social History Narrative   Not on file   Social Determinants of Health   Financial Resource Strain: Low Risk    Difficulty of Paying Living Expenses: Not hard at all  Food Insecurity: No Food Insecurity   Worried About Charity fundraiser in the Last Year: Never true   Arboriculturist in the Last Year: Never true  Transportation Needs: No Transportation Needs   Lack of Transportation (Medical): No   Lack of Transportation (Non-Medical): No  Physical Activity: Insufficiently Active   Days of Exercise per Week: 6 days   Minutes of Exercise per Session: 20 min  Stress: Not on file  Social Connections: Not on file    Tobacco Counseling Counseling given: Not Answered   Clinical Intake:                 Diabetic?No         Activities of Daily Living In your present state of health, do you have any difficulty performing the following activities: 03/12/2020  Hearing? Y  Comment wears hearing aids.  Vision? N  Difficulty concentrating or making decisions? N  Walking or climbing stairs? N  Dressing or bathing? N  Doing errands, shopping? N  Preparing Food and eating ? N  Using the Toilet? N  In the past six months, have you accidently leaked urine? N  Do you have problems with loss of bowel control? N  Managing your Medications? N  Managing your Finances? N   Housekeeping or managing your Housekeeping? N  Some recent data might be hidden    Patient Care Team: Mosie Lukes, MD as PCP - General (Family Medicine) [place], Jennefer Bravo, MD as Referring Physician (Dermatology) Cherre Robins, PharmD (Pharmacist)  Indicate any recent Medical Services you may have received from other than [facility] providers in the past year (date may be approximate).     Assessment:   This is  a routine wellness examination for [place].  Hearing/Vision screen No results found.  Dietary issues and exercise activities discussed:     Goals Addressed   None    Depression Screen PHQ 2/9 Scores 03/12/2020 06/26/2019 09/28/2017 01/09/2016 10/25/2014 07/10/2013  PHQ - 2 Score 0 0 0 0 0 0  PHQ- 9 Score - 3 - - - -    Fall Risk Fall Risk  03/12/2020 09/28/2017 01/09/2016 10/25/2014 07/10/2013  Falls in the past year? 1 No No No No  Number falls in past yr: 0 - - - -  Injury with Fall? 0 - - - -  Follow up Falls prevention discussed;Education provided - - - -    FALL RISK PREVENTION PERTAINING TO THE HOME:  Any stairs in or around the home? {YES/NO:21197} If so, are there any without handrails? {YES/NO:21197} Home free of loose throw rugs in walkways, pet beds, electrical cords, etc? {YES/NO:21197} Adequate lighting in your home to reduce risk of falls? {YES/NO:21197}  ASSISTIVE DEVICES UTILIZED TO PREVENT FALLS:  Life alert? {YES/NO:21197} Use of a cane, walker or w/c? {YES/NO:21197} Grab bars in the bathroom? {YES/NO:21197} Shower chair or bench in shower? {YES/NO:21197} Elevated toilet seat or a handicapped toilet? {YES/NO:21197}  TIMED UP AND GO:  Was the test performed? {YES/NO:21197}.  Length of time to ambulate 10 feet: *** sec.   {Appearance of Gait:2101803}  Cognitive Function: MMSE - Mini Mental State Exam 09/28/2017  Orientation to time 5  Orientation to Place 5  Registration 3  Attention/ Calculation 5  Recall 1  Language- name 2 objects  2  Language- repeat 1  Language- follow 3 step command 3  Language- read & follow direction 1  Write a sentence 1  Copy design 1  Total score 28     6CIT Screen 03/12/2020  What Year? 0 points  What month? 0 points  What time? 0 points  Count back from 20 0 points  Months in reverse 0 points  Repeat phrase 0 points  Total Score 0    Immunizations Immunization History  Administered Date(s) Administered   Fluad Quad(high Dose 65+) 06/15/2019   Influenza Split 07/10/2011, 07/05/2012   Influenza Whole 07/15/2010   Influenza, High Dose Seasonal PF 06/29/2016, 06/29/2017, 06/13/2018, 10/18/2020   Influenza,inj,Quad PF,6+ Mos 06/27/2013, 07/04/2014, 07/11/2015   Moderna Sars-Covid-2 Vaccination 11/09/2019, 12/07/2019, 07/25/2020   Pneumococcal Conjugate-13 07/14/2008   Pneumococcal Polysaccharide-23 07/11/2015   Td 09/15/2007   Tdap 06/22/2018   Zoster, Live 09/14/2006    TDAP status: Up to date  Flu Vaccine status: Up to date  Pneumococcal vaccine status: Up to date  Covid-19 vaccine status: Completed vaccines  Qualifies for Shingles Vaccine? Yes   Zostavax completed Yes   Shingrix Completed?: No.    Education has been provided regarding the importance of this vaccine. Patient has been advised to call insurance company to determine out of pocket expense if they have not yet received this vaccine. Advised may also receive vaccine at local pharmacy or Health Dept. Verbalized acceptance and understanding.  Screening Tests Health Maintenance  Topic Date Due   Zoster Vaccines- Shingrix (1 of 2) Never done   MAMMOGRAM  09/10/2020   COVID-19 Vaccine (4 - Booster for Moderna series) 11/22/2020   INFLUENZA VACCINE  04/14/2021   TETANUS/TDAP  06/22/2028   DEXA SCAN  Completed   PNA vac Low Risk Adult  Completed   HPV VACCINES  Aged Out    Health Maintenance  Health Maintenance Due  Topic Date  Due   Zoster Vaccines- Shingrix (1 of 2) Never done   MAMMOGRAM  09/10/2020    COVID-19 Vaccine (4 - Booster for Moderna series) 11/22/2020    Colorectal cancer screening: No longer required.   {Mammogram status:21018020}  {Bone Density status:21018021}  Lung Cancer Screening: (Low Dose CT Chest recommended if Age 10-80 years, 30 pack-year currently smoking OR have quit w/in 15years.) does not qualify.     Additional Screening:  Hepatitis C Screening: does not qualify  Vision Screening: Recommended annual ophthalmology exams for early detection of glaucoma and other disorders of the eye. Is the patient up to date with their annual eye exam?  {YES/NO:21197} Who is the provider or what is the name of the office in which the patient attends annual eye exams? *** If pt is not established with a provider, would they like to be referred to a provider to establish care? {YES/NO:21197}.   Dental Screening: Recommended annual dental exams for proper oral hygiene  Community Resource Referral / Chronic Care Management: CRR required this visit?  {YES/NO:21197}  CCM required this visit?  {YES/NO:21197}     Plan:     I have personally reviewed and noted the following in the patient's chart:   Medical and social history Use of alcohol, tobacco or illicit drugs  Current medications and supplements including opioid prescriptions.  Functional ability and status Nutritional status Physical activity Advanced directives List of other physicians Hospitalizations, surgeries, and ER visits in previous 12 months Vitals Screenings to include cognitive, depression, and falls Referrals and appointments  In addition, I have reviewed and discussed with patient certain preventive protocols, quality metrics, and best practice recommendations. A written personalized care plan for preventive services as well as general preventive health recommendations were provided to patient.     Marta Antu, LPN   [id number]  Nurse Health Advisor  Nurse Notes: ***

## 2021-03-13 ENCOUNTER — Ambulatory Visit: Payer: Medicare Other | Admitting: *Deleted

## 2021-03-13 ENCOUNTER — Ambulatory Visit: Payer: Medicare Other

## 2021-03-21 NOTE — Progress Notes (Signed)
Subjective:   Molly Williams is a 87 y.o. female who presents for Medicare Annual (Subsequent) preventive examination.    Review of Systems     Cardiac Risk Factors include: advanced age (>34men, >57 women);dyslipidemia     Objective:    Today's Vitals   03/24/21 0857  BP: (!) 148/70  Pulse: 69  Resp: 16  Temp: (!) 97.4 F (36.3 C)  TempSrc: Temporal  SpO2: 98%  Weight: 113 lb 9.6 oz (51.5 kg)  Height: 5\' 2"  (1.575 m)   Body mass index is 20.78 kg/m.  Advanced Directives 03/24/2021 03/12/2020 08/09/2018 09/28/2017 10/09/2016 09/22/2014  Does Patient Have a Medical Advance Directive? Yes Yes Yes Yes Yes No  Type of Paramedic of [place];Living will [place];Living will - [place];Living will [place];Living will -  Does patient want to make changes to medical advance directive? - No - Patient declined No - Patient declined - - -  Copy of [place] in Chart? Yes - validated most recent copy scanned in chart (See row information) Yes - validated most recent copy scanned in chart (See row information) - Yes No - copy requested -  Would patient like information on creating a medical advance directive? - - - - - No - patient declined information    Current Medications (verified) Outpatient Encounter Medications as of 03/24/2021  Medication Sig   acetaminophen (TYLENOL) 500 MG tablet Take 1,000 mg by mouth every 6 (six) hours as needed. pain   albuterol (VENTOLIN HFA) 108 (90 Base) MCG/ACT inhaler Inhale 2 puffs into the lungs every 6 (six) hours as needed for wheezing or shortness of breath.   aspirin-acetaminophen-caffeine (EXCEDRIN MIGRAINE) 250-250-65 MG tablet Take by mouth every 6 (six) hours as needed for headache.   Cholecalciferol (VITAMIN D) 2000 units CAPS Take 2,000 Units by mouth daily.   EUTHYROX 25 MCG tablet TAKE 1 TABLET BY MOUTH ONCE DAILY EXCEPT  TAKE  2   TABLETS  BY  MOUTH  ON  TUESDAYS  AND  SATURDAYS   famotidine (PEPCID) 20 MG tablet Take 20 mg by mouth daily as needed for heartburn.    omeprazole (PRILOSEC) 20 MG capsule Take 1 capsule (20 mg total) by mouth daily.   Turmeric 500 MG CAPS Take 1 capsule by mouth daily.   No facility-administered encounter medications on file as of 03/24/2021.    Allergies (verified) Codeine, Iodine, Alendronate, Bacitracin, Iodinated diagnostic agents, and Bactrim  [sulfamethoxazole-trimethoprim]   History: Past Medical History:  Diagnosis Date   Anemia 03/29/2015   Arthritis    Atrophic vaginitis    Cholelithiasis 2008   Fibrocystic breast disease    bx 2008 neg per pt report   GERD (gastroesophageal reflux disease)    Hearing loss    Hearing loss sensory, bilateral    bil/hearing aids   Hematuria    chronic Dr. Burnell Blanks   Hyperlipidemia 11/17/2016   Lupus (systemic lupus erythematosus) ([place])    Dr. Charlestine Night   Macular degeneration 11/17/2016   Migraine aura without headache    h/o ocular migraines   Osteopenia    Preventative health care 11/18/2016   Renal insufficiency 03/29/2015   Retinal detachment with break    right/laser   Retinal tear of right eye 11/17/2016   Skin lesion of face 01/09/2016   Spinal stenosis    Thyroid disease    hypothyroidism   Urinary tract bacterial infections  has had urethral dilations in past   Vitamin B12 deficiency 01/09/2016   Vitamin D deficiency 07/11/2015   Past Surgical History:  Procedure Laterality Date   APPENDECTOMY  2006   CATARACT EXTRACTION W/ INTRAOCULAR LENS  IMPLANT, BILATERAL     CHOLECYSTECTOMY     EYE SURGERY Bilateral    cataracts   TONSILLECTOMY  1940   VAGINAL HYSTERECTOMY     partial   Family History  Problem Relation Age of Onset   Stroke Paternal Grandmother    Stroke Father    Coronary artery disease Father    Coronary artery disease Mother    Stroke Mother    Cancer Paternal Aunt        GYN cancer   Cancer Paternal  Uncle        lung, smoker   Stroke Maternal Grandmother    Stroke Maternal Grandfather    Heart disease Paternal Grandfather    Hydrocephalus Brother        normopressure hydrocephalus, dementia   Social History   Socioeconomic History   Marital status: Widowed    Spouse name: Not on file   Number of children: Not on file   Years of education: Not on file   Highest education level: Not on file  Occupational History   Not on file  Tobacco Use   Smoking status: Former    Pack years: 0.00    Types: Cigarettes    Quit date: 1970    Years since quitting: 52.5   Smokeless tobacco: Never  Substance and Sexual Activity   Alcohol use: Yes    Alcohol/week: 10.0 standard drinks    Types: 10 Glasses of wine per week   Drug use: No   Sexual activity: Not Currently    Comment: lives alone here because her daughter lives hier, no dietary restrictions, avoids dairy  Other Topics Concern   Not on file  Social History Narrative   Not on file   Social Determinants of Health   Financial Resource Strain: Low Risk    Difficulty of Paying Living Expenses: Not hard at all  Food Insecurity: No Food Insecurity   Worried About Charity fundraiser in the Last Year: Never true   Arboriculturist in the Last Year: Never true  Transportation Needs: No Transportation Needs   Lack of Transportation (Medical): No   Lack of Transportation (Non-Medical): No  Physical Activity: Insufficiently Active   Days of Exercise per Week: 6 days   Minutes of Exercise per Session: 20 min  Stress: No Stress Concern Present   Feeling of Stress : Not at all  Social Connections: Moderately Integrated   Frequency of Communication with Friends and Family: More than three times a week   Frequency of Social Gatherings with Friends and Family: More than three times a week   Attends Religious Services: More than 4 times per year   Active Member of [employer] or Organizations: Yes   Attends Archivist Meetings: 1  to 4 times per year   Marital Status: Widowed    Tobacco Counseling Counseling given: Not Answered   Clinical Intake:  Pre-visit preparation completed: Yes  Pain : No/denies pain     Nutritional Status: BMI of 19-24  Normal Nutritional Risks: None Diabetes: No  How often do you need to have someone help you when you read instructions, pamphlets, or other written materials from your doctor or pharmacy?: 1 - Never  Diabetic?No  Interpreter Needed?: No  Information entered by :: Caroleen Hamman LPN   Activities of Daily Living In your present state of health, do you have any difficulty performing the following activities: 03/24/2021  Hearing? Y  Comment hearing aids  Vision? N  Difficulty concentrating or making decisions? Y  Walking or climbing stairs? N  Dressing or bathing? N  Doing errands, shopping? N  Preparing Food and eating ? N  Using the Toilet? N  In the past six months, have you accidently leaked urine? N  Do you have problems with loss of bowel control? N  Managing your Medications? N  Managing your Finances? N  Housekeeping or managing your Housekeeping? N  Some recent data might be hidden    Patient Care Team: Mosie Lukes, MD as PCP - General (Family Medicine) [place], Jennefer Bravo, MD as Referring Physician (Dermatology) Cherre Robins, PharmD (Pharmacist)  Indicate any recent Medical Services you may have received from other than [facility] providers in the past year (date may be approximate).     Assessment:   This is a routine wellness examination for [place].  Hearing/Vision screen Hearing Screening - Comments:: C/o hearing loss-wears bilateral hearing aids Vision Screening - Comments:: Last eye exam-2021-Dr. Eulas Post Reading glasses  Dietary issues and exercise activities discussed: Current Exercise Habits: Home exercise routine, Type of exercise: strength training/weights, Time (Minutes): 20, Frequency (Times/Week): 7, Weekly Exercise  (Minutes/Week): 140, Intensity: Mild, Exercise limited by: None identified   Goals Addressed               This Visit's Progress     Patient Stated     Maintain current healthy lifestyle and independence (pt-stated)   On track      Depression Screen PHQ 2/9 Scores 03/24/2021 03/12/2020 06/26/2019 09/28/2017 01/09/2016 10/25/2014 07/10/2013  PHQ - 2 Score 0 0 0 0 0 0 0  PHQ- 9 Score - - 3 - - - -    Fall Risk Fall Risk  03/24/2021 03/12/2020 09/28/2017 01/09/2016 10/25/2014  Falls in the past year? 0 1 No No No  Number falls in past yr: 0 0 - - -  Injury with Fall? 0 0 - - -  Follow up Falls prevention discussed Falls prevention discussed;Education provided - - -    FALL RISK PREVENTION PERTAINING TO THE HOME:  Any stairs in or around the home? Yes  If so, are there any without handrails? No  Home free of loose throw rugs in walkways, pet beds, electrical cords, etc? Yes  Adequate lighting in your home to reduce risk of falls? Yes   ASSISTIVE DEVICES UTILIZED TO PREVENT FALLS:  Life alert? No  Use of a cane, walker or w/c? No  Grab bars in the bathroom? Yes  Shower chair or bench in shower? Yes  Elevated toilet seat or a handicapped toilet? No   TIMED UP AND GO:  Was the test performed? Yes .  Length of time to ambulate 10 feet: 11 sec.   Gait steady and fast without use of assistive device  Cognitive Function:Patient & daughter has noticed some short term memory changes. Message sent to PCP. MMSE - Mini Mental State Exam 09/28/2017  Orientation to time 5  Orientation to Place 5  Registration 3  Attention/ Calculation 5  Recall 1  Language- name 2 objects 2  Language- repeat 1  Language- follow 3 step command 3  Language- read & follow direction 1  Write a sentence 1  Copy design 1  Total score 28  6CIT Screen 03/24/2021 03/12/2020  What Year? 0 points 0 points  What month? 0 points 0 points  What time? 3 points 0 points  Count back from 20 0 points 0  points  Months in reverse 0 points 0 points  Repeat phrase 0 points 0 points  Total Score 3 0    Immunizations Immunization History  Administered Date(s) Administered   Fluad Quad(high Dose 65+) 06/15/2019   Influenza Split 07/10/2011, 07/05/2012   Influenza Whole 07/15/2010   Influenza, High Dose Seasonal PF 06/29/2016, 06/29/2017, 06/13/2018, 10/18/2020   Influenza,inj,Quad PF,6+ Mos 06/27/2013, 07/04/2014, 07/11/2015   Moderna Sars-Covid-2 Vaccination 11/09/2019, 12/07/2019, 07/25/2020   Pneumococcal Conjugate-13 07/14/2008   Pneumococcal Polysaccharide-23 07/11/2015   Td 09/15/2007   Tdap 06/22/2018   Zoster, Live 09/14/2006    TDAP status: Up to date  Flu Vaccine status: Up to date  Pneumococcal vaccine status: Up to date  Covid-19 vaccine status: Information provided on how to obtain vaccines. Booster due  Qualifies for Shingles Vaccine? Yes   Zostavax completed Yes   Shingrix Completed?: No.    Education has been provided regarding the importance of this vaccine. Patient has been advised to call insurance company to determine out of pocket expense if they have not yet received this vaccine. Advised may also receive vaccine at local pharmacy or Health Dept. Verbalized acceptance and understanding.  Screening Tests Health Maintenance  Topic Date Due   Zoster Vaccines- Shingrix (1 of 2) Never done   MAMMOGRAM  09/10/2020   COVID-19 Vaccine (4 - Booster for Moderna series) 11/22/2020   INFLUENZA VACCINE  04/14/2021   TETANUS/TDAP  06/22/2028   DEXA SCAN  Completed   PNA vac Low Risk Adult  Completed   HPV VACCINES  Aged Out    Health Maintenance  Health Maintenance Due  Topic Date Due   Zoster Vaccines- Shingrix (1 of 2) Never done   MAMMOGRAM  09/10/2020   COVID-19 Vaccine (4 - Booster for Moderna series) 11/22/2020    Colorectal cancer screening: No longer required.   Mammogram status: No longer required due to Patient declines due to age.  Bone  Density status: Declined  Lung Cancer Screening: (Low Dose CT Chest recommended if Age 34-80 years, 30 pack-year currently smoking OR have quit w/in 15years.) does not qualify.    Additional Screening:  Hepatitis C Screening: Does not qualify  Vision Screening: Recommended annual ophthalmology exams for early detection of glaucoma and other disorders of the eye. Is the patient up to date with their annual eye exam?  Yes  Who is the provider or what is the name of the office in which the patient attends annual eye exams? Dr. Eulas Post   Dental Screening: Recommended annual dental exams for proper oral hygiene  Community Resource Referral / Chronic Care Management: CRR required this visit?  No   CCM required this visit?  No      Plan:     I have personally reviewed and noted the following in the patient's chart:   Medical and social history Use of alcohol, tobacco or illicit drugs  Current medications and supplements including opioid prescriptions.  Functional ability and status Nutritional status Physical activity Advanced directives List of other physicians Hospitalizations, surgeries, and ER visits in previous 12 months Vitals Screenings to include cognitive, depression, and falls Referrals and appointments  In addition, I have reviewed and discussed with patient certain preventive protocols, quality metrics, and best practice recommendations. A written personalized care plan for preventive services  as well as general preventive health recommendations were provided to patient.      Marta Antu, LPN   5/75/0518  Nurse Health Advisor  Nurse Notes: Patient would like a referral to Neurology for short term memory issues. Message sent to PCP.

## 2021-03-24 ENCOUNTER — Other Ambulatory Visit: Payer: Self-pay | Admitting: Family Medicine

## 2021-03-24 ENCOUNTER — Telehealth: Payer: Self-pay

## 2021-03-24 ENCOUNTER — Other Ambulatory Visit: Payer: Self-pay

## 2021-03-24 ENCOUNTER — Ambulatory Visit (INDEPENDENT_AMBULATORY_CARE_PROVIDER_SITE_OTHER): Payer: Medicare Other

## 2021-03-24 VITALS — BP 148/70 | HR 69 | Temp 97.4°F | Resp 16 | Ht 62.0 in | Wt 113.6 lb

## 2021-03-24 DIAGNOSIS — Z0000 Encounter for general adult medical examination without abnormal findings: Secondary | ICD-10-CM

## 2021-03-24 DIAGNOSIS — R413 Other amnesia: Secondary | ICD-10-CM

## 2021-03-24 NOTE — Telephone Encounter (Signed)
Patient is requesting a referral to neurology for short term memory loss. Patient & daughter have noticed some memory changes.

## 2021-03-24 NOTE — Patient Instructions (Signed)
Molly Williams , Thank you for taking time to come for your Medicare Wellness Visit. I appreciate your ongoing commitment to your health goals. Please review the following plan we discussed and let me know if I can assist you in the future.   Screening recommendations/referrals: Colonoscopy: No longer required Mammogram: Declined Bone Density: Declined Recommended yearly ophthalmology/optometry visit for glaucoma screening and checkup Recommended yearly dental visit for hygiene and checkup  Vaccinations: Influenza vaccine: Up to date Pneumococcal vaccine: Up to date Tdap vaccine: Up to date Shingles vaccine: Up to date   Covid-19:Booster due-May obtain vaccine at your local pharmacy  Advanced directives: Copy in chart  Conditions/risks identified: See problem list  Next appointment: Follow up in one year for your annual wellness visit    Preventive Care 65 Years and Older, Female Preventive care refers to lifestyle choices and visits with your health care provider that can promote health and wellness. What does preventive care include? A yearly physical exam. This is also called an annual well check. Dental exams once or twice a year. Routine eye exams. Ask your health care provider how often you should have your eyes checked. Personal lifestyle choices, including: Daily care of your teeth and gums. Regular physical activity. Eating a healthy diet. Avoiding tobacco and drug use. Limiting alcohol use. Practicing safe sex. Taking low-dose aspirin every day. Taking vitamin and mineral supplements as recommended by your health care provider. What happens during an annual well check? The services and screenings done by your health care provider during your annual well check will depend on your age, overall health, lifestyle risk factors, and family history of disease. Counseling  Your health care provider may ask you questions about your: Alcohol use. Tobacco use. Drug  use. Emotional well-being. Home and relationship well-being. Sexual activity. Eating habits. History of falls. Memory and ability to understand (cognition). Work and work Statistician. Reproductive health. Screening  You may have the following tests or measurements: Height, weight, and BMI. Blood pressure. Lipid and cholesterol levels. These may be checked every 5 years, or more frequently if you are over 88 years old. Skin check. Lung cancer screening. You may have this screening every year starting at age 63 if you have a 30-pack-year history of smoking and currently smoke or have quit within the past 15 years. Fecal occult blood test (FOBT) of the stool. You may have this test every year starting at age 21. Flexible sigmoidoscopy or colonoscopy. You may have a sigmoidoscopy every 5 years or a colonoscopy every 10 years starting at age 42. Hepatitis C blood test. Hepatitis B blood test. Sexually transmitted disease (STD) testing. Diabetes screening. This is done by checking your blood sugar (glucose) after you have not eaten for a while (fasting). You may have this done every 1-3 years. Bone density scan. This is done to screen for osteoporosis. You may have this done starting at age 12. Mammogram. This may be done every 1-2 years. Talk to your health care provider about how often you should have regular mammograms. Talk with your health care provider about your test results, treatment options, and if necessary, the need for more tests. Vaccines  Your health care provider may recommend certain vaccines, such as: Influenza vaccine. This is recommended every year. Tetanus, diphtheria, and acellular pertussis (Tdap, Td) vaccine. You may need a Td booster every 10 years. Zoster vaccine. You may need this after age 28. Pneumococcal 13-valent conjugate (PCV13) vaccine. One dose is recommended after age 51. Pneumococcal polysaccharide (  PPSV23) vaccine. One dose is recommended after age  74. Talk to your health care provider about which screenings and vaccines you need and how often you need them. This information is not intended to replace advice given to you by your health care provider. Make sure you discuss any questions you have with your health care provider. Document Released: 09/27/2015 Document Revised: 05/20/2016 Document Reviewed: 07/02/2015 Elsevier Interactive Patient Education  2017 [place] Prevention in the Home Falls can cause injuries. They can happen to people of all ages. There are many things you can do to make your home safe and to help prevent falls. What can I do on the outside of my home? Regularly fix the edges of walkways and driveways and fix any cracks. Remove anything that might make you trip as you walk through a door, such as a raised step or threshold. Trim any bushes or trees on the path to your home. Use bright outdoor lighting. Clear any walking paths of anything that might make someone trip, such as rocks or tools. Regularly check to see if handrails are loose or broken. Make sure that both sides of any steps have handrails. Any raised decks and porches should have guardrails on the edges. Have any leaves, snow, or ice cleared regularly. Use sand or salt on walking paths during winter. Clean up any spills in your garage right away. This includes oil or grease spills. What can I do in the bathroom? Use night lights. Install grab bars by the toilet and in the tub and shower. Do not use towel bars as grab bars. Use non-skid mats or decals in the tub or shower. If you need to sit down in the shower, use a plastic, non-slip stool. Keep the floor dry. Clean up any water that spills on the floor as soon as it happens. Remove soap buildup in the tub or shower regularly. Attach bath mats securely with double-sided non-slip rug tape. Do not have throw rugs and other things on the floor that can make you trip. What can I do in the  bedroom? Use night lights. Make sure that you have a light by your bed that is easy to reach. Do not use any sheets or blankets that are too big for your bed. They should not hang down onto the floor. Have a firm chair that has side arms. You can use this for support while you get dressed. Do not have throw rugs and other things on the floor that can make you trip. What can I do in the kitchen? Clean up any spills right away. Avoid walking on wet floors. Keep items that you use a lot in easy-to-reach places. If you need to reach something above you, use a strong step stool that has a grab bar. Keep electrical cords out of the way. Do not use floor polish or wax that makes floors slippery. If you must use wax, use non-skid floor wax. Do not have throw rugs and other things on the floor that can make you trip. What can I do with my stairs? Do not leave any items on the stairs. Make sure that there are handrails on both sides of the stairs and use them. Fix handrails that are broken or loose. Make sure that handrails are as long as the stairways. Check any carpeting to make sure that it is firmly attached to the stairs. Fix any carpet that is loose or worn. Avoid having throw rugs at the top or  bottom of the stairs. If you do have throw rugs, attach them to the floor with carpet tape. Make sure that you have a light switch at the top of the stairs and the bottom of the stairs. If you do not have them, ask someone to add them for you. What else can I do to help prevent falls? Wear shoes that: Do not have high heels. Have rubber bottoms. Are comfortable and fit you well. Are closed at the toe. Do not wear sandals. If you use a stepladder: Make sure that it is fully opened. Do not climb a closed stepladder. Make sure that both sides of the stepladder are locked into place. Ask someone to hold it for you, if possible. Clearly mark and make sure that you can see: Any grab bars or  handrails. First and last steps. Where the edge of each step is. Use tools that help you move around (mobility aids) if they are needed. These include: Canes. Walkers. Scooters. Crutches. Turn on the lights when you go into a dark area. Replace any light bulbs as soon as they burn out. Set up your furniture so you have a clear path. Avoid moving your furniture around. If any of your floors are uneven, fix them. If there are any pets around you, be aware of where they are. Review your medicines with your doctor. Some medicines can make you feel dizzy. This can increase your chance of falling. Ask your doctor what other things that you can do to help prevent falls. This information is not intended to replace advice given to you by your health care provider. Make sure you discuss any questions you have with your health care provider. Document Released: 06/27/2009 Document Revised: 02/06/2016 Document Reviewed: 10/05/2014 Elsevier Interactive Patient Education  2017 [employer].

## 2021-03-25 ENCOUNTER — Encounter: Payer: Self-pay | Admitting: Physician Assistant

## 2021-04-24 ENCOUNTER — Encounter: Payer: Self-pay | Admitting: Physician Assistant

## 2021-04-24 ENCOUNTER — Other Ambulatory Visit (INDEPENDENT_AMBULATORY_CARE_PROVIDER_SITE_OTHER): Payer: Medicare Other

## 2021-04-24 ENCOUNTER — Ambulatory Visit (INDEPENDENT_AMBULATORY_CARE_PROVIDER_SITE_OTHER): Payer: Medicare Other | Admitting: Physician Assistant

## 2021-04-24 ENCOUNTER — Other Ambulatory Visit: Payer: Self-pay

## 2021-04-24 VITALS — BP 150/75 | HR 66 | Resp 18 | Ht 62.0 in | Wt 112.0 lb

## 2021-04-24 DIAGNOSIS — R413 Other amnesia: Secondary | ICD-10-CM

## 2021-04-24 DIAGNOSIS — G3184 Mild cognitive impairment, so stated: Secondary | ICD-10-CM | POA: Diagnosis not present

## 2021-04-24 LAB — VITAMIN B12: Vitamin B-12: 186 pg/mL — ABNORMAL LOW (ref 211–911)

## 2021-04-24 LAB — TSH: TSH: 4.16 u[IU]/mL (ref 0.35–5.50)

## 2021-04-24 NOTE — Patient Instructions (Addendum)
It was a pleasure to see you today at our office.   Recommendations:  MRI of the brain, the radiology office will call you to arrange you appointment Check labs today Follow up in 1 month   RECOMMENDATIONS FOR ALL PATIENTS WITH MEMORY PROBLEMS: 1. Continue to exercise (Recommend 30 minutes of walking everyday, or 3 hours every week) 2. Increase social interactions - continue going to [place] and enjoy social gatherings with friends and family 3. Eat healthy, avoid fried foods and eat more fruits and vegetables 4. Maintain adequate blood pressure, blood sugar, and blood cholesterol level. Reducing the risk of stroke and cardiovascular disease also helps promoting better memory. 5. Avoid stressful situations. Live a simple life and avoid aggravations. Organize your time and prepare for the next day in anticipation. 6. Sleep well, avoid any interruptions of sleep and avoid any distractions in the bedroom that may interfere with adequate sleep quality 7. Avoid sugar, avoid sweets as there is a strong link between excessive sugar intake, diabetes, and cognitive impairment We discussed the Mediterranean diet, which has been shown to help patients reduce the risk of progressive memory disorders and reduces cardiovascular risk. This includes eating fish, eat fruits and green leafy vegetables, nuts like almonds and hazelnuts, walnuts, and also use olive oil. Avoid fast foods and fried foods as much as possible. Avoid sweets and sugar as sugar use has been linked to worsening of memory function.  There is always a concern of gradual progression of memory problems. If this is the case, then we may need to adjust level of care according to patient needs. Support, both to the patient and caregiver, should then be put into place.        FALL PRECAUTIONS: Be cautious when walking. Scan the area for obstacles that may increase the risk of trips and falls. When getting up in the mornings, sit up at the edge  of the bed for a few minutes before getting out of bed. Consider elevating the bed at the head end to avoid drop of blood pressure when getting up. Walk always in a well-lit room (use night lights in the walls). Avoid area rugs or power cords from appliances in the middle of the walkways. Use a walker or a cane if necessary and consider physical therapy for balance exercise. Get your eyesight checked regularly.  FINANCIAL OVERSIGHT: Supervision, especially oversight when making financial decisions or transactions is also recommended.  HOME SAFETY: Consider the safety of the kitchen when operating appliances like stoves, microwave oven, and blender. Consider having supervision and share cooking responsibilities until no longer able to participate in those. Accidents with firearms and other hazards in the house should be identified and addressed as well.   ABILITY TO BE LEFT ALONE: If patient is unable to contact 911 operator, consider using LifeLine, or when the need is there, arrange for someone to stay with patients. Smoking is a fire hazard, consider supervision or cessation. Risk of wandering should be assessed by caregiver and if detected at any point, supervision and safe proof recommendations should be instituted.  MEDICATION SUPERVISION: Inability to self-administer medication needs to be constantly addressed. Implement a mechanism to ensure safe administration of the medications.   DRIVING: Regarding driving, in patients with progressive memory problems, driving will be impaired. We advise to have someone else do the driving if trouble finding directions or if minor accidents are reported. Independent driving assessment is available to determine safety of driving.   If you are  interested in the driving assessment, you can contact the following:  [employer] in [place]  [place] [phone] or [phone]    [place] refers to food and lifestyle choices that are based on the traditions of countries located on the [employer]. This way of eating has been shown to help prevent certain conditions and improve outcomes for people who have chronic diseases, like kidney disease and heart disease. What are tips for following this plan? Lifestyle  Cook and eat meals together with your family, when possible. Drink enough fluid to keep your urine clear or pale yellow. Be physically active every day. This includes: Aerobic exercise like running or swimming. Leisure activities like gardening, walking, or housework. Get 7-8 hours of sleep each night. If recommended by your health care provider, drink red wine in moderation. This means 1 glass a day for nonpregnant women and 2 glasses a day for men. A glass of wine equals 5 oz (150 mL). Reading food labels  Check the serving size of packaged foods. For foods such as rice and pasta, the serving size refers to the amount of cooked product, not dry. Check the total fat in packaged foods. Avoid foods that have saturated fat or trans fats. Check the ingredients list for added sugars, such as corn syrup. Shopping  At the grocery store, buy most of your food from the areas near the walls of the store. This includes: Fresh fruits and vegetables (produce). Grains, beans, nuts, and seeds. Some of these may be available in unpackaged forms or large amounts (in bulk). Fresh seafood. Poultry and eggs. Low-fat dairy products. Buy whole ingredients instead of prepackaged foods. Buy fresh fruits and vegetables in-season from local farmers markets. Buy frozen fruits and vegetables in resealable bags. If you do not have access to quality fresh seafood, buy precooked frozen shrimp or canned fish, such as tuna, salmon, or sardines. Buy small amounts of raw or cooked  vegetables, salads, or olives from the deli or salad bar at your store. Stock your pantry so you always have certain foods on hand, such as olive oil, canned tuna, canned tomatoes, rice, pasta, and beans. Cooking  Cook foods with extra-virgin olive oil instead of using butter or other vegetable oils. Have meat as a side dish, and have vegetables or grains as your main dish. This means having meat in small portions or adding small amounts of meat to foods like pasta or stew. Use beans or vegetables instead of meat in common dishes like chili or lasagna. Experiment with different cooking methods. Try roasting or broiling vegetables instead of steaming or sauteing them. Add frozen vegetables to soups, stews, pasta, or rice. Add nuts or seeds for added healthy fat at each meal. You can add these to yogurt, salads, or vegetable dishes. Marinate fish or vegetables using olive oil, lemon juice, garlic, and fresh herbs. Meal planning  Plan to eat 1 vegetarian meal one day each week. Try to work up to 2 vegetarian meals, if possible. Eat seafood 2 or more times a week. Have healthy snacks readily available, such as: Vegetable sticks with hummus. Greek yogurt. Fruit and nut trail mix. Eat balanced meals throughout the week. This includes: Fruit: 2-3 servings a day Vegetables: 4-5 servings a day Low-fat dairy: 2 servings a day Fish, poultry, or lean meat: 1 serving a day Beans and legumes: 2 or more servings a week  Nuts and seeds: 1-2 servings a day Whole grains: 6-8 servings a day Extra-virgin olive oil: 3-4 servings a day Limit red meat and sweets to only a few servings a month What are my food choices? Mediterranean diet Recommended Grains: Whole-grain pasta. Brown rice. Bulgar wheat. Polenta. Couscous. Whole-wheat bread. [place]. Vegetables: Artichokes. Beets. Broccoli. Cabbage. Carrots. Eggplant. Green beans. Chard. Kale. Spinach. Onions. Leeks. Peas. Squash. Tomatoes. Peppers.  Radishes. Fruits: Apples. Apricots. Avocado. Berries. Bananas. Cherries. Dates. Figs. Grapes. Lemons. Melon. Oranges. Peaches. Plums. Pomegranate. Meats and other protein foods: Beans. Almonds. Sunflower seeds. Pine nuts. Peanuts. [place]. Salmon. Scallops. Shrimp. [place]. Tilapia. Clams. Oysters. Eggs. Dairy: Low-fat milk. Cheese. Greek yogurt. Beverages: Water. Red wine. Herbal tea. Fats and oils: Extra virgin olive oil. Avocado oil. Grape seed oil. Sweets and desserts: [place] yogurt with honey. Baked apples. Poached pears. Trail mix. Seasoning and other foods: Basil. Cilantro. Coriander. Cumin. Mint. Parsley. Sage. Rosemary. Tarragon. Garlic. Oregano. Thyme. Pepper. Balsalmic vinegar. Tahini. Hummus. Tomato sauce. Olives. Mushrooms. Limit these Grains: Prepackaged pasta or rice dishes. Prepackaged cereal with added sugar. Vegetables: Deep fried potatoes (french fries). Fruits: Fruit canned in syrup. Meats and other protein foods: Beef. Pork. Lamb. Poultry with skin. Hot dogs. Berniece Salines. Dairy: Ice cream. Sour cream. Whole milk. Beverages: Juice. Sugar-sweetened soft drinks. Beer. Liquor and spirits. Fats and oils: Butter. Canola oil. Vegetable oil. Beef fat (tallow). Lard. Sweets and desserts: Cookies. Cakes. Pies. Candy. Seasoning and other foods: Mayonnaise. Premade sauces and marinades. The items listed may not be a complete list. Talk with your dietitian about what dietary choices are right for you. Summary The Mediterranean diet includes both food and lifestyle choices. Eat a variety of fresh fruits and vegetables, beans, nuts, seeds, and whole grains. Limit the amount of red meat and sweets that you eat. Talk with your health care provider about whether it is safe for you to drink red wine in moderation. This means 1 glass a day for nonpregnant women and 2 glasses a day for men. A glass of wine equals 5 oz (150 mL). This information is not intended to replace advice given to you by your health  care provider. Make sure you discuss any questions you have with your health care provider. Document Released: 04/23/2016 Document Revised: 05/26/2016 Document Reviewed: 04/23/2016 Elsevier Interactive Patient Education  2017 [employer].

## 2021-04-24 NOTE — Progress Notes (Addendum)
Assessment/Plan:   Molly Williams is a 87 y.o. year old female with risk factors including  age, hypertension, hyperlipidemia, B hearing loss, SLE,  migraines, hypothyroidism, B12 deficiency, Vit D deficiency, seen today for evaluation of memory loss.  MoCA was intended, but the patient became very anxious. MMSE was tried successfully, yielding a value of 26/30 with possible Mild Cognitive Impairment   Recommendations:   Mild Cognitive Impairment  MRI brain with/without contrast to assess for underlying structural abnormality and assess vascular load  Check B12, TSH Discussed safety both in and out of the home.  Discussed the importance of regular daily schedule with inclusion of crossword puzzles to maintain brain function.  Continue to monitor mood with PCP.  Stay active at least 30 minutes at least 3 times a week.  Naps should be scheduled and should be no longer than 60 minutes and should not occur after 2 PM.  Mediterranean diet is recommended  Folllow up in 1 month   Subjective:    The patient is seen in neurologic consultation at the request of Molly Lukes, MD for the evaluation of memory.  The patient is accompanied by daughter  who supplements the history. She is a 87 y.o. year old female who has had memory issues for about 3 to 4 years, when she noticed that her instant recall and short-term memory was "not good, Molly Williams has to remind me ".  She is worried that very specific information "is gone "some days are worse than others.  This creates anxiety, at which time "the memory is not existent".  She is also concerned about memory storage, but daughter reports that she is much sharper in the morning, and later in the day is when she begins to have trouble remembering.  If the conversation went on, at the end of the day she will not have any recall of the conversation.  She admits to have difficulty concentrating.   She lives alone, and is independent of dressing and  changing.  She is very sociable, and she reports her mood being "happy, thankful, and likes to be helpful to other people ".  After her husband's death, the patient may have had some degree of depression, but she is now better, having found new friends, and began dating, which has helped her emotionally.  She denies irritability she likes to exercise daily.  She reports her sleep as "has always been terrible, ever since I was young, and he used to work as a Social research officer, government, there is always something in my mind ".  During the night, sometimes she wakes up at 3 AM, and keeps herself busy, without going back to sleep immediately.  Because she is tired during the day, then she takes a small nap feeling refreshed.  She denies any vivid dreams or sleepwalking.  No hallucinations or paranoia.  She denies placing objects in unusual spots.   She takes her own medications, without forgetting any doses.  She still manages her finances without missing any bills.  Her appetite is good, denies trouble swallowing.  She enjoys cooking, and denies leaving the stove on. She has not forgotten her common recipes, her favorite one is Investment banker, operational. She ambulates without difficulty without the use of a walker or a cane.  She drives getting lost.  She only drives to places that she is familiar with. She has a history of classic headaches, with aura, but she has not have a months.  She denies any falls, head  injuries, double vision, dizziness unilateral weakness or retention, constipation or diarrhea, or anosmia.  Denies history of deep apnea.  She drinks about 10 glasses of wine a week.   Family history significant stroke in several members of her family, and NPH  with dementia in brother     Allergies  Allergen Reactions   Codeine Nausea And Vomiting and Nausea Only   Iodine Hives    Contrast dye   Alendronate    Bacitracin Swelling    Ophthalmic ointment   Iodinated Diagnostic Agents    Bactrim  [Sulfamethoxazole-Trimethoprim]  Hives, Itching and Rash    Current Outpatient Medications  Medication Instructions   acetaminophen (TYLENOL) 1,000 mg, Oral, Every 6 hours PRN, pain    albuterol (VENTOLIN HFA) 108 (90 Base) MCG/ACT inhaler 2 puffs, Inhalation, Every 6 hours PRN   aspirin-acetaminophen-caffeine (EXCEDRIN MIGRAINE) 250-250-65 MG tablet Oral, Every 6 hours PRN   EUTHYROX 25 MCG tablet TAKE 1 TABLET BY MOUTH ONCE DAILY EXCEPT  TAKE  2  TABLETS  BY  MOUTH  ON  TUESDAYS  AND  SATURDAYS   famotidine (PEPCID) 20 mg, Oral, Daily PRN   omeprazole (PRILOSEC) 20 mg, Oral, Daily   Turmeric 500 MG CAPS 1 capsule, Oral, Daily   Vitamin D 2,000 Units, Oral, Daily     VITALS:   Vitals:   04/24/21 0948  BP: (!) 150/75  Pulse: 66  Resp: 18  SpO2: 96%  Weight: 112 lb (50.8 kg)  Height: '5\' 2"'$  (1.575 m)    PHYSICAL EXAM   HEENT:  Normocephalic, atraumatic. The mucous membranes are moist. The superficial temporal arteries are without ropiness or tenderness. Cardiovascular: Regular rate and rhythm. Lungs: Clear to auscultation bilaterally. Neck: There are no carotid bruits noted bilaterally.  NEUROLOGICAL: No flowsheet data found. MMSE - Mini Mental State Exam 04/24/2021 09/28/2017  Orientation to time 5 5  Orientation to Place 5 5  Registration 3 3  Attention/ Calculation 2 5  Recall 2 1  Language- name 2 objects 2 2  Language- repeat 1 1  Language- follow 3 step command 3 3  Language- read & follow direction 1 1  Write a sentence 1 1  Copy design 1 1  Total score 26 28    No flowsheet data found.   Orientation:  Alert and oriented to person, place and time. No aphasia or dysarthria. Fund of knowledge is appropriate. Recent memory impaired and remote memory intact.  Attention and concentration are normal.  Able to name objects and repeat phrases. Delayed recall 2/3 Cranial nerves: There is good facial symmetry. Extraocular muscles are intact and visual fields are full to confrontational testing. Speech  is fluent and clear. Soft palate rises symmetrically and there is no tongue deviation. Hearing is intact to conversational tone. Tone: Tone is good throughout. Sensation: Sensation is intact to light touch and pinprick throughout. Vibration is intact at the bilateral big toe.There is no extinction with double simultaneous stimulation. There is no sensory dermatomal level identified. Coordination: The patient has no difficulty with RAM's or FNF bilaterally. Normal finger to nose  Motor: Strength is 5/5 in the bilateral upper and lower extremities. There is no pronator drift. There are no fasciculations noted. DTR's: Deep tendon reflexes are 2/4 at the bilateral biceps, triceps, brachioradialis, patella and achilles.  Plantar responses are downgoing bilaterally. Gait and Station: The patient is able to ambulate without difficulty.The patient is able to heel toe walk without any difficulty.The patient is able to ambulate in  a tandem fashion. The patient is able to stand in the Romberg position.     Thank you for allowing [place] the opportunity to participate in the care of this nice patient. Please do not hesitate to contact us for any questions or concerns.   Total time spent on today's visit was 60 minutes, including both face-to-face time and nonface-to-face time.  Time included that spent on review of records (prior notes available to me/labs/imaging if pertinent), discussing treatment and goals, answering patient's questions and coordinating care.  Cc:  Molly Lukes, MD  Sharene Butters 04/24/2021 12:47 PM

## 2021-05-09 ENCOUNTER — Ambulatory Visit
Admission: RE | Admit: 2021-05-09 | Discharge: 2021-05-09 | Disposition: A | Discharge status: Home / Self Care | Payer: Medicare Other | Source: Ambulatory Visit | Attending: Physician Assistant | Admitting: Physician Assistant

## 2021-05-09 DIAGNOSIS — I6782 Cerebral ischemia: Secondary | ICD-10-CM | POA: Diagnosis not present

## 2021-05-09 DIAGNOSIS — R413 Other amnesia: Secondary | ICD-10-CM | POA: Diagnosis not present

## 2021-05-09 DIAGNOSIS — J3489 Other specified disorders of nose and nasal sinuses: Secondary | ICD-10-CM | POA: Diagnosis not present

## 2021-05-13 NOTE — Progress Notes (Signed)
Patient advised of MRI results, verbally understood.

## 2021-05-28 ENCOUNTER — Ambulatory Visit: Payer: Medicare Other | Admitting: Physician Assistant

## 2021-06-03 ENCOUNTER — Other Ambulatory Visit: Payer: Self-pay

## 2021-06-03 ENCOUNTER — Encounter: Payer: Self-pay | Admitting: Physician Assistant

## 2021-06-03 ENCOUNTER — Ambulatory Visit: Payer: Medicare Other | Admitting: Physician Assistant

## 2021-06-03 VITALS — BP 168/76 | HR 67 | Ht 62.0 in | Wt 111.0 lb

## 2021-06-03 DIAGNOSIS — G3184 Mild cognitive impairment, so stated: Secondary | ICD-10-CM | POA: Diagnosis not present

## 2021-06-03 NOTE — Progress Notes (Signed)
Assessment/Plan:    Mild Cognitive Impairment without behavioral disturbance No changes from her prior visit.  MRI of the brain shows moderate atrophy and chronic microvascular ischemic disease without acute findings.  Results have been shown and discussed with the patient and the family.  Last MMSE was 26/30 equal MoCA 21/30.  Latest labs remarkable for low B12 at 186, the patient is now taking supplements.  Recommendations:   Discussed safety both in and out of the home.  Discussed the importance of regular daily schedule with inclusion of crossword puzzles to maintain brain function.  Continue to monitor mood by PCP Stay active at least 30 minutes at least 3 times a week.  Naps should be scheduled and should be no longer than 60 minutes and should not occur after 2 PM.  Mediterranean diet is recommended  Follow up in 6  months. Continue B12 supplements daily    Case discussed with Dr. Delice Lesch who agrees with the plan     Subjective:   ED visits since last seen: none  Hospital admissions: none  HALEY FUERSTENBERG is a 87 y.o. female with risk factors including  age, hypertension, hyperlipidemia, B hearing loss, SLE, migraines, hypothyroidism, B12 deficiency, Vit D deficiency, seen today for evaluation of memory loss and follow up on the MRI results . She was las seen on 04/24/21, at which time she was diagnosed with Mild Cognitive Impairment. Not on [facility] at this time, patient is not interested unless memory worsens.  This patient is accompanied in the office by daughter [name] who supplements the history.  Previous records as well as any outside records available were reviewed prior to todays visit.    Since her last visit, she feels that her memory is about the same, mostly issues with short-term memory.  She has "some good days and some bad days ".  Her daughter [name] reports that she is much sharper in the morning, worse during the afternoon.  She still has issues not recalling  conversations that took place at the beginning of the day.  There is some difficulty concentrating.  She continues to live alone, although the daughter is in the process of moving her with her for safety.  Her daughter [name] says that she is frequently at the computer, but constantly changes passwords because she forgets the prior password, and gets locked out, this happened 3 times recently, and diligently tries to fix it, looking for resources in YouTube or other Internet resources on how to fix it, "which makes things worse ".  Her daughter is looking into digital fingerprint or visual recognition as an alternative to enter the system.  She is independent of her ADLs, and very sociable.  Her mood is very happy, and likes to be helpful to other people,continues to date her boyfriend.  Denies irritability, hallucinations, or paranoia.  She has a degree of anxiety, which has been present all throughout.  She exercises daily, [name] denies any wandering of behavior.  Her sleep is "not that good, but the same as before ".  Denies any REM behavior.  She denies forgetting to take any medications or paying bills.  She is independent of bathing and dressing.  Her appetite is good, she eats a gluten-free diet, and has lost 1 pound since her last visit, intentionally.  She enjoys cooking, denies leaving the stove on.  She denies forgetting any common recipes, her favorite one is Investment banker, operational.  She ambulates without difficulty without the use of a walker  or a cane.  She continues to drive short distances, especially to the shopping center or the hair salon, without getting lost.  She denies driving to unfamiliar places.  She has a history of classic headaches with aura, but she has not have a months.  She denies any falls, head injuries, double vision, dizziness unilateral weakness or retention, constipation or diarrhea, or anosmia.    Initial Evaluation 04/24/21 The patient is seen in neurologic consultation at the request of  Mosie Lukes, MD for the evaluation of memory.  The patient is accompanied by daughter  who supplements the history. She is a 87 y.o. year old female who has had memory issues for about 3 to 4 years, when she noticed that her instant recall and short-term memory was "not good, [name] has to remind me ".  She is worried that very specific information "is gone "some days are worse than others.  This creates anxiety, at which time "the memory is not existent".  She is also concerned about memory storage, but daughter reports that she is much sharper in the morning, and later in the day is when she begins to have trouble remembering.  If the conversation went on, at the end of the day she will not have any recall of the conversation.  She admits to have difficulty concentrating.  She lives alone, and is independent of dressing and changing.  She is very sociable, and she reports her mood being "happy, thankful, and likes to be helpful to other people ".  After her husband's death, the patient may have had some degree of depression, but she is now better, having found new friends, and began dating, which has helped her emotionally.  She denies irritability she likes to exercise daily.  She reports her sleep as "has always been terrible, ever since I was young, and he used to work as a Social research officer, government, there is always something in my mind ".  During the night, sometimes she wakes up at 3 AM, and keeps herself busy, without going back to sleep immediately.  Because she is tired during the day, then she takes a small nap feeling refreshed.  She denies any vivid dreams or sleepwalking.  No hallucinations or paranoia.  She denies placing objects in unusual spots. She takes her own medications, without forgetting any doses.  She still manages her finances without missing any bills.  Her appetite is good, denies trouble swallowing.  She enjoys cooking, and denies leaving the stove on. She has not forgotten her common recipes,  her favorite one is Investment banker, operational. She ambulates without difficulty without the use of a walker or a cane.  She drives getting lost.  She only drives to places that she is familiar with.She has a history of classic headaches, with aura, but she has not have a months.  She denies any falls, head injuries, double vision, dizziness unilateral weakness or retention, constipation or diarrhea, or anosmia.  Denies history of deep apnea.  She drinks about 10 glasses of wine a week.   Family history significant stroke in several members of her family, and NPH  with dementia in brother  Labs 8/11/22c TSH 4.16, B12 186 (low)   MRI brain wo contrast 05/09/21 No evidence of acute intracranial abnormality or reversible cause of memory loss.: No acute infarction, hemorrhage, hydrocephalus, extra-axial collection or mass lesion. Moderate atrophy and chronic microvascular ischemic disease.     PREVIOUS MEDICATIONS:   CURRENT MEDICATIONS:  Outpatient Encounter Medications as of  06/03/2021  Medication Sig   acetaminophen (TYLENOL) 500 MG tablet Take 1,000 mg by mouth every 6 (six) hours as needed. pain   albuterol (VENTOLIN HFA) 108 (90 Base) MCG/ACT inhaler Inhale 2 puffs into the lungs every 6 (six) hours as needed for wheezing or shortness of breath.   aspirin-acetaminophen-caffeine (EXCEDRIN MIGRAINE) 250-250-65 MG tablet Take by mouth every 6 (six) hours as needed for headache.   Cholecalciferol (VITAMIN D) 2000 units CAPS Take 2,000 Units by mouth daily.   EUTHYROX 25 MCG tablet TAKE 1 TABLET BY MOUTH ONCE DAILY EXCEPT  TAKE  2  TABLETS  BY  MOUTH  ON  TUESDAYS  AND  SATURDAYS   famotidine (PEPCID) 20 MG tablet Take 20 mg by mouth daily as needed for heartburn.    omeprazole (PRILOSEC) 20 MG capsule Take 1 capsule (20 mg total) by mouth daily.   Turmeric 500 MG CAPS Take 1 capsule by mouth daily.   No facility-administered encounter medications on file as of 06/03/2021.     Objective:     PHYSICAL  EXAMINATION:    VITALS:   Vitals:   06/03/21 0926  BP: (!) 168/76  Pulse: 67  SpO2: 95%  Weight: 111 lb (50.3 kg)  Height: 5\' 2"  (1.575 m)    GEN:  The patient appears stated age and is in NAD. HEENT:  Normocephalic, atraumatic.   Neurological examination:  General: NAD, well-groomed, appears stated age. Orientation: The patient is alert. Oriented to person, place not to date Cranial nerves: There is good facial symmetry.The speech is fluent and clear. No aphasia or dysarthria. Fund of knowledge is appropriate. Recent  memory impaired and remote memory intact.  Attention and concentration are reduced.  Able to name objects and repeat phrases.  Hearing is reduced without aid.   Sensation: Sensation is intact to light touch throughout Motor: Strength is at least antigravity x4. Tremors: none  DTR's 2/4 in UE/LE    No flowsheet data found. MMSE - Mini Mental State Exam 04/24/2021 09/28/2017  Orientation to time 5 5  Orientation to Place 5 5  Registration 3 3  Attention/ Calculation 2 5  Recall 2 1  Language- name 2 objects 2 2  Language- repeat 1 1  Language- follow 3 step command 3 3  Language- read & follow direction 1 1  Write a sentence 1 1  Copy design 1 1  Total score 26 28    No flowsheet data found.     Movement examination: Tone: There is normal tone in the UE/LE Abnormal movements:  no tremor.  No myoclonus.  No asterixis.   Coordination:  There is no decremation with RAM's. Normal finger to nose  Gait and Station: The patient has no difficulty arising out of a deep-seated chair without the use of the hands. The patient's stride length is good.  Gait is cautious and narrow.        Total time spent on today's visit was 30 minutes, including both face-to-face time and nonface-to-face time. Time included that spent on review of records (prior notes available to me/labs/imaging if pertinent), discussing treatment and goals, answering patient's questions and  coordinating care.  Cc:  Mosie Lukes, MD Sharene Butters, PA-C

## 2021-06-03 NOTE — Patient Instructions (Addendum)
It was a pleasure to see you today at our office.   Recommendations:  Follow up in 6 months Continue taking your vitamin B12  Check labs today  RECOMMENDATIONS FOR ALL PATIENTS WITH MEMORY PROBLEMS: 1. Continue to exercise (Recommend 30 minutes of walking everyday, or 3 hours every week) 2. Increase social interactions - continue going to [place] and enjoy social gatherings with friends and family 3. Eat healthy, avoid fried foods and eat more fruits and vegetables 4. Maintain adequate blood pressure, blood sugar, and blood cholesterol level. Reducing the risk of stroke and cardiovascular disease also helps promoting better memory. 5. Avoid stressful situations. Live a simple life and avoid aggravations. Organize your time and prepare for the next day in anticipation. 6. Sleep well, avoid any interruptions of sleep and avoid any distractions in the bedroom that may interfere with adequate sleep quality 7. Avoid sugar, avoid sweets as there is a strong link between excessive sugar intake, diabetes, and cognitive impairment We discussed the Mediterranean diet, which has been shown to help patients reduce the risk of progressive memory disorders and reduces cardiovascular risk. This includes eating fish, eat fruits and green leafy vegetables, nuts like almonds and hazelnuts, walnuts, and also use olive oil. Avoid fast foods and fried foods as much as possible. Avoid sweets and sugar as sugar use has been linked to worsening of memory function.  There is always a concern of gradual progression of memory problems. If this is the case, then we may need to adjust level of care according to patient needs. Support, both to the patient and caregiver, should then be put into place.        FALL PRECAUTIONS: Be cautious when walking. Scan the area for obstacles that may increase the risk of trips and falls. When getting up in the mornings, sit up at the edge of the bed for a few minutes before getting out  of bed. Consider elevating the bed at the head end to avoid drop of blood pressure when getting up. Walk always in a well-lit room (use night lights in the walls). Avoid area rugs or power cords from appliances in the middle of the walkways. Use a walker or a cane if necessary and consider physical therapy for balance exercise. Get your eyesight checked regularly.  FINANCIAL OVERSIGHT: Supervision, especially oversight when making financial decisions or transactions is also recommended.  HOME SAFETY: Consider the safety of the kitchen when operating appliances like stoves, microwave oven, and blender. Consider having supervision and share cooking responsibilities until no longer able to participate in those. Accidents with firearms and other hazards in the house should be identified and addressed as well.   ABILITY TO BE LEFT ALONE: If patient is unable to contact 911 operator, consider using LifeLine, or when the need is there, arrange for someone to stay with patients. Smoking is a fire hazard, consider supervision or cessation. Risk of wandering should be assessed by caregiver and if detected at any point, supervision and safe proof recommendations should be instituted.  MEDICATION SUPERVISION: Inability to self-administer medication needs to be constantly addressed. Implement a mechanism to ensure safe administration of the medications.   DRIVING: Regarding driving, in patients with progressive memory problems, driving will be impaired. We advise to have someone else do the driving if trouble finding directions or if minor accidents are reported. Independent driving assessment is available to determine safety of driving.   If you are interested in the driving assessment, you can contact the  following:  [employer] in [place]  [place] [phone] or  [phone]    [place] refers to food and lifestyle choices that are based on the traditions of countries located on the [employer]. This way of eating has been shown to help prevent certain conditions and improve outcomes for people who have chronic diseases, like kidney disease and heart disease. What are tips for following this plan? Lifestyle  Cook and eat meals together with your family, when possible. Drink enough fluid to keep your urine clear or pale yellow. Be physically active every day. This includes: Aerobic exercise like running or swimming. Leisure activities like gardening, walking, or housework. Get 7-8 hours of sleep each night. If recommended by your health care provider, drink red wine in moderation. This means 1 glass a day for nonpregnant women and 2 glasses a day for men. A glass of wine equals 5 oz (150 mL). Reading food labels  Check the serving size of packaged foods. For foods such as rice and pasta, the serving size refers to the amount of cooked product, not dry. Check the total fat in packaged foods. Avoid foods that have saturated fat or trans fats. Check the ingredients list for added sugars, such as corn syrup. Shopping  At the grocery store, buy most of your food from the areas near the walls of the store. This includes: Fresh fruits and vegetables (produce). Grains, beans, nuts, and seeds. Some of these may be available in unpackaged forms or large amounts (in bulk). Fresh seafood. Poultry and eggs. Low-fat dairy products. Buy whole ingredients instead of prepackaged foods. Buy fresh fruits and vegetables in-season from local farmers markets. Buy frozen fruits and vegetables in resealable bags. If you do not have access to quality fresh seafood, buy precooked frozen shrimp or canned fish, such as tuna, salmon, or sardines. Buy small amounts of raw or cooked vegetables, salads, or olives from the deli or salad bar  at your store. Stock your pantry so you always have certain foods on hand, such as olive oil, canned tuna, canned tomatoes, rice, pasta, and beans. Cooking  Cook foods with extra-virgin olive oil instead of using butter or other vegetable oils. Have meat as a side dish, and have vegetables or grains as your main dish. This means having meat in small portions or adding small amounts of meat to foods like pasta or stew. Use beans or vegetables instead of meat in common dishes like chili or lasagna. Experiment with different cooking methods. Try roasting or broiling vegetables instead of steaming or sauteing them. Add frozen vegetables to soups, stews, pasta, or rice. Add nuts or seeds for added healthy fat at each meal. You can add these to yogurt, salads, or vegetable dishes. Marinate fish or vegetables using olive oil, lemon juice, garlic, and fresh herbs. Meal planning  Plan to eat 1 vegetarian meal one day each week. Try to work up to 2 vegetarian meals, if possible. Eat seafood 2 or more times a week. Have healthy snacks readily available, such as: Vegetable sticks with hummus. Greek yogurt. Fruit and nut trail mix. Eat balanced meals throughout the week. This includes: Fruit: 2-3 servings a day Vegetables: 4-5 servings a day Low-fat dairy: 2 servings a day Fish, poultry, or lean meat: 1 serving a day Beans and legumes: 2 or more servings a week Nuts and seeds: 1-2 servings a day Whole grains:  6-8 servings a day Extra-virgin olive oil: 3-4 servings a day Limit red meat and sweets to only a few servings a month What are my food choices? Mediterranean diet Recommended Grains: Whole-grain pasta. Brown rice. Bulgar wheat. Polenta. Couscous. Whole-wheat bread. [place]. Vegetables: Artichokes. Beets. Broccoli. Cabbage. Carrots. Eggplant. Green beans. Chard. Kale. Spinach. Onions. Leeks. Peas. Squash. Tomatoes. Peppers. Radishes. Fruits: Apples. Apricots. Avocado. Berries.  Bananas. Cherries. Dates. Figs. Grapes. Lemons. Melon. Oranges. Peaches. Plums. Pomegranate. Meats and other protein foods: Beans. Almonds. Sunflower seeds. Pine nuts. Peanuts. [place]. Salmon. Scallops. Shrimp. [place]. Tilapia. Clams. Oysters. Eggs. Dairy: Low-fat milk. Cheese. Greek yogurt. Beverages: Water. Red wine. Herbal tea. Fats and oils: Extra virgin olive oil. Avocado oil. Grape seed oil. Sweets and desserts: [place] yogurt with honey. Baked apples. Poached pears. Trail mix. Seasoning and other foods: Basil. Cilantro. Coriander. Cumin. Mint. Parsley. Sage. Rosemary. Tarragon. Garlic. Oregano. Thyme. Pepper. Balsalmic vinegar. Tahini. Hummus. Tomato sauce. Olives. Mushrooms. Limit these Grains: Prepackaged pasta or rice dishes. Prepackaged cereal with added sugar. Vegetables: Deep fried potatoes (french fries). Fruits: Fruit canned in syrup. Meats and other protein foods: Beef. Pork. Lamb. Poultry with skin. Hot dogs. [place] Salines. Dairy: Ice cream. Sour cream. Whole milk. Beverages: Juice. Sugar-sweetened soft drinks. Beer. Liquor and spirits. Fats and oils: Butter. Canola oil. Vegetable oil. Beef fat (tallow). Lard. Sweets and desserts: Cookies. Cakes. Pies. Candy. Seasoning and other foods: Mayonnaise. Premade sauces and marinades. The items listed may not be a complete list. Talk with your dietitian about what dietary choices are right for you. Summary The Mediterranean diet includes both food and lifestyle choices. Eat a variety of fresh fruits and vegetables, beans, nuts, seeds, and whole grains. Limit the amount of red meat and sweets that you eat. Talk with your health care provider about whether it is safe for you to drink red wine in moderation. This means 1 glass a day for nonpregnant women and 2 glasses a day for men. A glass of wine equals 5 oz (150 mL). This information is not intended to replace advice given to you by your health care provider. Make sure you discuss any questions you  have with your health care provider. Document Released: 04/23/2016 Document Revised: 05/26/2016 Document Reviewed: 04/23/2016 Elsevier Interactive Patient Education  2017 [employer].

## 2021-06-10 ENCOUNTER — Telehealth: Payer: Self-pay | Admitting: Family Medicine

## 2021-06-10 NOTE — Telephone Encounter (Signed)
Patient would like a referral to [facility] ear, nose, and throat so she can get tested and get hearing aids. Please advice.

## 2021-06-12 ENCOUNTER — Other Ambulatory Visit: Payer: Self-pay | Admitting: Family Medicine

## 2021-06-12 DIAGNOSIS — H9190 Unspecified hearing loss, unspecified ear: Secondary | ICD-10-CM

## 2021-06-12 NOTE — Telephone Encounter (Signed)
I spoke with and she knows

## 2021-07-01 DIAGNOSIS — H903 Sensorineural hearing loss, bilateral: Secondary | ICD-10-CM | POA: Diagnosis not present

## 2021-09-02 DIAGNOSIS — H903 Sensorineural hearing loss, bilateral: Secondary | ICD-10-CM | POA: Diagnosis not present

## 2021-09-04 ENCOUNTER — Other Ambulatory Visit: Payer: Self-pay | Admitting: Family Medicine

## 2021-09-04 DIAGNOSIS — E079 Disorder of thyroid, unspecified: Secondary | ICD-10-CM

## 2021-09-10 ENCOUNTER — Other Ambulatory Visit: Payer: Self-pay | Admitting: Family Medicine

## 2021-09-15 ENCOUNTER — Other Ambulatory Visit: Payer: Self-pay | Admitting: Family Medicine

## 2021-09-15 DIAGNOSIS — E079 Disorder of thyroid, unspecified: Secondary | ICD-10-CM

## 2021-09-18 NOTE — Telephone Encounter (Signed)
Ok to change Euthyrox to [facility] Brand levothyroxin?  Euthyrox D/C

## 2021-09-29 ENCOUNTER — Ambulatory Visit (INDEPENDENT_AMBULATORY_CARE_PROVIDER_SITE_OTHER): Payer: Medicare PPO | Admitting: Family Medicine

## 2021-09-29 ENCOUNTER — Telehealth: Payer: Self-pay | Admitting: Family Medicine

## 2021-09-29 ENCOUNTER — Encounter: Payer: Self-pay | Admitting: Family Medicine

## 2021-09-29 VITALS — BP 128/64 | HR 97 | Temp 98.1°F | Resp 16 | Ht 62.0 in | Wt 116.0 lb

## 2021-09-29 DIAGNOSIS — E538 Deficiency of other specified B group vitamins: Secondary | ICD-10-CM | POA: Diagnosis not present

## 2021-09-29 DIAGNOSIS — N289 Disorder of kidney and ureter, unspecified: Secondary | ICD-10-CM | POA: Diagnosis not present

## 2021-09-29 DIAGNOSIS — D649 Anemia, unspecified: Secondary | ICD-10-CM | POA: Diagnosis not present

## 2021-09-29 DIAGNOSIS — M810 Age-related osteoporosis without current pathological fracture: Secondary | ICD-10-CM

## 2021-09-29 DIAGNOSIS — E559 Vitamin D deficiency, unspecified: Secondary | ICD-10-CM | POA: Diagnosis not present

## 2021-09-29 DIAGNOSIS — G3184 Mild cognitive impairment, so stated: Secondary | ICD-10-CM | POA: Diagnosis not present

## 2021-09-29 DIAGNOSIS — E782 Mixed hyperlipidemia: Secondary | ICD-10-CM | POA: Diagnosis not present

## 2021-09-29 DIAGNOSIS — G4700 Insomnia, unspecified: Secondary | ICD-10-CM

## 2021-09-29 DIAGNOSIS — E079 Disorder of thyroid, unspecified: Secondary | ICD-10-CM

## 2021-09-29 DIAGNOSIS — R739 Hyperglycemia, unspecified: Secondary | ICD-10-CM

## 2021-09-29 LAB — CBC
HCT: 38.4 % (ref 36.0–46.0)
Hemoglobin: 12.7 g/dL (ref 12.0–15.0)
MCHC: 33 g/dL (ref 30.0–36.0)
MCV: 97.8 fl (ref 78.0–100.0)
Platelets: 303 10*3/uL (ref 150.0–400.0)
RBC: 3.93 Mil/uL (ref 3.87–5.11)
RDW: 14 % (ref 11.5–15.5)
WBC: 6.1 10*3/uL (ref 4.0–10.5)

## 2021-09-29 LAB — VITAMIN B12: Vitamin B-12: 428 pg/mL (ref 211–911)

## 2021-09-29 LAB — LIPID PANEL
Cholesterol: 228 mg/dL — ABNORMAL HIGH (ref 0–200)
HDL: 106.1 mg/dL (ref >39.00)
LDL Cholesterol: 101 mg/dL — ABNORMAL HIGH (ref 0–99)
NonHDL: 122.25
Total CHOL/HDL Ratio: 2
Triglycerides: 104 mg/dL (ref 0.0–149.0)
VLDL: 20.8 mg/dL (ref 0.0–40.0)

## 2021-09-29 LAB — TSH: TSH: 4.38 u[IU]/mL (ref 0.35–5.50)

## 2021-09-29 LAB — COMPREHENSIVE METABOLIC PANEL
ALT: 12 U/L (ref 0–35)
AST: 16 U/L (ref 0–37)
Albumin: 4.3 g/dL (ref 3.5–5.2)
Alkaline Phosphatase: 82 U/L (ref 39–117)
BUN: 16 mg/dL (ref 6–23)
CO2: 27 mEq/L (ref 19–32)
Calcium: 9.3 mg/dL (ref 8.4–10.5)
Chloride: 102 mEq/L (ref 96–112)
Creatinine, Ser: 0.87 mg/dL (ref 0.40–1.20)
GFR: 59.43 mL/min — ABNORMAL LOW (ref >60.00)
Glucose, Bld: 98 mg/dL (ref 70–99)
Potassium: 5.2 mEq/L — ABNORMAL HIGH (ref 3.5–5.1)
Sodium: 135 mEq/L (ref 135–145)
Total Bilirubin: 0.4 mg/dL (ref 0.2–1.2)
Total Protein: 7.4 g/dL (ref 6.0–8.3)

## 2021-09-29 LAB — VITAMIN D 25 HYDROXY (VIT D DEFICIENCY, FRACTURES): VITD: 38.3 ng/mL (ref 30.00–100.00)

## 2021-09-29 LAB — HEMOGLOBIN A1C: Hgb A1c MFr Bld: 5.7 % (ref 4.6–6.5)

## 2021-09-29 MED ORDER — OMEPRAZOLE 20 MG PO CPDR: 20.0000 mg | DELAYED_RELEASE_CAPSULE | Freq: Every day | ORAL | 1 refills | Status: DC

## 2021-09-29 MED ORDER — TRAZODONE HCL 50 MG PO TABS: 25.0000 mg | ORAL_TABLET | Freq: Every evening | ORAL | 3 refills | Status: DC | PRN

## 2021-09-29 NOTE — Assessment & Plan Note (Signed)
She is accompanied by her adult children who are increasingly worried about her worsening short term memory loss. They have been encouraging her to let her son help with the bills, he lives in [place] so has to do what he can from a distance but her daughter lives near her and helps with the day to day. They agree to neuropsych testing to further evaluate.  She is encouraged to learn something new and to let her children help her with daily activities.

## 2021-09-29 NOTE — Assessment & Plan Note (Addendum)
Encouraged good sleep hygiene such as dark, quiet room. No blue/green glowing lights such as computer screens in bedroom. No alcohol or stimulants in evening. Cut down on caffeine as able. Regular exercise is helpful but not just prior to bed time. Started on Trazadone 50 mg po qhs.

## 2021-09-29 NOTE — Progress Notes (Signed)
Subjective:    Patient ID: Molly Williams, female    DOB: [birth year], 88 y.o.   MRN: [id number]  Chief Complaint  Patient presents with   Insomnia   Eczema    HPI Patient is in today for follow up on chronic medical concerns. No recent febrile illness or hospitalizations. She is accompanied by her 2 adult children who report they are worrying more and more about her short term memory and they and the patient are worried about her worsening insomnia and anxiety. She is struggling with worsening hearing loss as well and gets very anxious at night. She awaits her new hearing aides next month. She acknowledges she can struggle with managing her ADLs and remembering to pay all of her bills. She struggles to stay asleep and sometimes to fall asleep as well. Denies CP/palp/SOB/HA/congestion/fevers/GI or GU c/o. Taking meds as prescribed   Past Medical History:  Diagnosis Date   Anemia 03/29/2015   Arthritis    Atrophic vaginitis    Cholelithiasis 2008   Fibrocystic breast disease    bx 2008 neg per pt report   GERD (gastroesophageal reflux disease)    Hearing loss    Hearing loss sensory, bilateral    bil/hearing aids   Hematuria    chronic Dr. Burnell Blanks   Hyperlipidemia 11/17/2016   Lupus (systemic lupus erythematosus) ([place])    Dr. Charlestine Night   Macular degeneration 11/17/2016   Migraine aura without headache    h/o ocular migraines   Osteopenia    Preventative health care 11/18/2016   Renal insufficiency 03/29/2015   Retinal detachment with break    right/laser   Retinal tear of right eye 11/17/2016   Skin lesion of face 01/09/2016   Spinal stenosis    Thyroid disease    hypothyroidism   Urinary tract bacterial infections    has had urethral dilations in past   Vitamin B12 deficiency 01/09/2016   Vitamin D deficiency 07/11/2015    Past Surgical History:  Procedure Laterality Date   APPENDECTOMY  2006   CATARACT EXTRACTION W/ INTRAOCULAR LENS  IMPLANT, BILATERAL     CHOLECYSTECTOMY      EYE SURGERY Bilateral    cataracts   TONSILLECTOMY  1940   VAGINAL HYSTERECTOMY     partial    Family History  Problem Relation Age of Onset   Stroke Paternal Grandmother    Stroke Father    Coronary artery disease Father    Coronary artery disease Mother    Stroke Mother    Cancer Paternal Aunt        GYN cancer   Cancer Paternal Uncle        lung, smoker   Stroke Maternal Grandmother    Stroke Maternal Grandfather    Heart disease Paternal Grandfather    Hydrocephalus Brother        normopressure hydrocephalus, dementia    Social History   Socioeconomic History   Marital status: Widowed    Spouse name: Not on file   Number of children: Not on file   Years of education: 13   Highest education level: Not on file  Occupational History   Not on file  Tobacco Use   Smoking status: Former    Types: Cigarettes    Quit date: 1970    Years since quitting: 53.0   Smokeless tobacco: Never  Substance and Sexual Activity   Alcohol use: Yes    Alcohol/week: 10.0 standard drinks    Types: 10 Glasses of  wine per week   Drug use: No   Sexual activity: Not Currently    Comment: lives alone here because her daughter lives hier, no dietary restrictions, avoids dairy  Other Topics Concern   Not on file  Social History Narrative   Right hand   Caffeine yes   One story home   Social Determinants of Health   Financial Resource Strain: Low Risk    Difficulty of Paying Living Expenses: Not hard at all  Food Insecurity: No Food Insecurity   Worried About Charity fundraiser in the Last Year: Never true   Arboriculturist in the Last Year: Never true  Transportation Needs: No Transportation Needs   Lack of Transportation (Medical): No   Lack of Transportation (Non-Medical): No  Physical Activity: Insufficiently Active   Days of Exercise per Week: 6 days   Minutes of Exercise per Session: 20 min  Stress: No Stress Concern Present   Feeling of Stress : Not at all  Social  Connections: Moderately Integrated   Frequency of Communication with Friends and Family: More than three times a week   Frequency of Social Gatherings with Friends and Family: More than three times a week   Attends Religious Services: More than 4 times per year   Active Member of [employer] or Organizations: Yes   Attends Archivist Meetings: 1 to 4 times per year   Marital Status: Widowed  Human resources officer Violence: Not At Risk   Fear of Current or Ex-Partner: No   Emotionally Abused: No   Physically Abused: No   Sexually Abused: No    Outpatient Medications Prior to Visit  Medication Sig Dispense Refill   acetaminophen (TYLENOL) 500 MG tablet Take 1,000 mg by mouth every 6 (six) hours as needed. pain     albuterol (VENTOLIN HFA) 108 (90 Base) MCG/ACT inhaler Inhale 2 puffs into the lungs every 6 (six) hours as needed for wheezing or shortness of breath. 18 g 3   aspirin-acetaminophen-caffeine (EXCEDRIN MIGRAINE) 010-272-53 MG tablet Take by mouth every 6 (six) hours as needed for headache.     Cholecalciferol (VITAMIN D) 2000 units CAPS Take 2,000 Units by mouth daily.     famotidine (PEPCID) 20 MG tablet Take 20 mg by mouth daily as needed for heartburn.      levothyroxine (SYNTHROID) 25 MCG tablet TAKE 1 TABLET DAILY EXCEPT TAKE 2 TABLETS ON Tuesday AND Saturday. 60 tablet 0   Turmeric 500 MG CAPS Take 1 capsule by mouth daily.     omeprazole (PRILOSEC) 20 MG capsule Take 1 capsule (20 mg total) by mouth daily. 90 capsule 1   No facility-administered medications prior to visit.    Allergies  Allergen Reactions   Codeine Nausea And Vomiting and Nausea Only   Iodine Hives    Contrast dye   Alendronate    Bacitracin Swelling    Ophthalmic ointment   Iodinated Contrast Media    Bactrim  [Sulfamethoxazole-Trimethoprim] Hives, Itching and Rash    Review of Systems  Constitutional:  Negative for fever and malaise/fatigue.  HENT:  Negative for congestion.   Eyes:  Negative  for blurred vision.  Respiratory:  Negative for shortness of breath.   Cardiovascular:  Negative for chest pain, palpitations and leg swelling.  Gastrointestinal:  Negative for abdominal pain, blood in stool and nausea.  Genitourinary:  Negative for dysuria and frequency.  Musculoskeletal:  Negative for falls.  Skin:  Negative for rash.  Neurological:  Negative  for dizziness, loss of consciousness and headaches.  Endo/Heme/Allergies:  Negative for environmental allergies.  Psychiatric/Behavioral:  Positive for memory loss. Negative for depression. The patient is nervous/anxious and has insomnia.       Objective:    Physical Exam Constitutional:      General: She is not in acute distress.    Appearance: She is well-developed.  HENT:     Head: Normocephalic and atraumatic.  Eyes:     Conjunctiva/sclera: Conjunctivae normal.  Neck:     Thyroid: No thyromegaly.  Cardiovascular:     Rate and Rhythm: Normal rate and regular rhythm.     Heart sounds: Normal heart sounds. No murmur heard. Pulmonary:     Effort: Pulmonary effort is normal. No respiratory distress.     Breath sounds: Normal breath sounds.  Abdominal:     General: Bowel sounds are normal. There is no distension.     Palpations: Abdomen is soft. There is no mass.     Tenderness: There is no abdominal tenderness.  Musculoskeletal:     Cervical back: Neck supple.  Lymphadenopathy:     Cervical: No cervical adenopathy.  Skin:    General: Skin is warm and dry.  Neurological:     Mental Status: She is alert and oriented to person, place, and time.  Psychiatric:        Behavior: Behavior normal.    BP 128/64    Pulse 97    Temp 98.1 F (36.7 C)    Resp 16    Ht 5\' 2"  (1.575 m)    Wt 116 lb (52.6 kg)    SpO2 98%    BMI 21.22 kg/m  Wt Readings from Last 3 Encounters:  09/29/21 116 lb (52.6 kg)  06/03/21 111 lb (50.3 kg)  04/24/21 112 lb (50.8 kg)    Diabetic Foot Exam - Simple   No data filed    Lab Results   Component Value Date   WBC 6.1 09/29/2021   HGB 12.7 09/29/2021   HCT 38.4 09/29/2021   PLT 303.0 09/29/2021   GLUCOSE 98 09/29/2021   CHOL 228 (H) 09/29/2021   TRIG 104.0 09/29/2021   HDL 106.10 09/29/2021   LDLCALC 101 (H) 09/29/2021   ALT 12 09/29/2021   AST 16 09/29/2021   NA 135 09/29/2021   K 5.2 No hemolysis seen (H) 09/29/2021   CL 102 09/29/2021   CREATININE 0.87 09/29/2021   BUN 16 09/29/2021   CO2 27 09/29/2021   TSH 4.38 09/29/2021   INR 0.94 09/22/2014   HGBA1C 5.7 09/29/2021    Lab Results  Component Value Date   TSH 4.38 09/29/2021   Lab Results  Component Value Date   WBC 6.1 09/29/2021   HGB 12.7 09/29/2021   HCT 38.4 09/29/2021   MCV 97.8 09/29/2021   PLT 303.0 09/29/2021   Lab Results  Component Value Date   NA 135 09/29/2021   K 5.2 No hemolysis seen (H) 09/29/2021   CO2 27 09/29/2021   GLUCOSE 98 09/29/2021   BUN 16 09/29/2021   CREATININE 0.87 09/29/2021   BILITOT 0.4 09/29/2021   ALKPHOS 82 09/29/2021   AST 16 09/29/2021   ALT 12 09/29/2021   PROT 7.4 09/29/2021   ALBUMIN 4.3 09/29/2021   CALCIUM 9.3 09/29/2021   ANIONGAP 8 09/22/2014   GFR 59.43 (L) 09/29/2021   Lab Results  Component Value Date   CHOL 228 (H) 09/29/2021   Lab Results  Component Value Date  HDL 106.10 09/29/2021   Lab Results  Component Value Date   LDLCALC 101 (H) 09/29/2021   Lab Results  Component Value Date   TRIG 104.0 09/29/2021   Lab Results  Component Value Date   CHOLHDL 2 09/29/2021   Lab Results  Component Value Date   HGBA1C 5.7 09/29/2021       Assessment & Plan:   Problem List Items Addressed This Visit     Thyroid disease - Primary    On Levothyroxine, continue to monitor      Relevant Orders   TSH (Completed)   Osteoporosis   Insomnia    Encouraged good sleep hygiene such as dark, quiet room. No blue/green glowing lights such as computer screens in bedroom. No alcohol or stimulants in evening. Cut down on caffeine as  able. Regular exercise is helpful but not just prior to bed time. Started on Trazadone 50 mg po qhs.       Hyperglycemia    hgba1c acceptable, minimize simple carbs. Increase exercise as tolerated.       Relevant Orders   Comprehensive metabolic panel (Completed)   Hemoglobin A1c (Completed)   Renal insufficiency    Hydrate and monitor      Anemia   Relevant Orders   CBC (Completed)   Vitamin D deficiency    Supplement and monitor      Relevant Orders   VITAMIN D 25 Hydroxy (Vit-D Deficiency, Fractures) (Completed)   Vitamin B12 deficiency    Supplement and monitor      Relevant Orders   Vitamin B12 (Completed)   Hyperlipidemia    Encourage heart healthy diet such as MIND or DASH diet, increase exercise, avoid trans fats, simple carbohydrates and processed foods, consider a krill or fish or flaxseed oil cap daily.       Relevant Orders   Lipid panel (Completed)   Mild cognitive impairment    She is accompanied by her adult children who are increasingly worried about her worsening short term memory loss. They have been encouraging her to let her son help with the bills, he lives in [place] so has to do what he can from a distance but her daughter lives near her and helps with the day to day. They agree to neuropsych testing to further evaluate.  She is encouraged to learn something new and to let her children help her with daily activities.       Relevant Orders   Ambulatory referral to Neuropsychology    I am having Dejah Droessler. Randol Kern start on traZODone. I am also having her maintain her acetaminophen, Vitamin D, famotidine, aspirin-acetaminophen-caffeine, Turmeric, albuterol, levothyroxine, and omeprazole.  Meds ordered this encounter  Medications   traZODone (DESYREL) 50 MG tablet    Sig: Take 0.5-1 tablets (25-50 mg total) by mouth at bedtime as needed for sleep.    Dispense:  30 tablet    Refill:  3   omeprazole (PRILOSEC) 20 MG capsule    Sig: Take 1 capsule  (20 mg total) by mouth daily.    Dispense:  90 capsule    Refill:  1     Penni Homans, MD

## 2021-09-29 NOTE — Assessment & Plan Note (Signed)
Supplement and monitor 

## 2021-09-29 NOTE — Patient Instructions (Addendum)
Magnesium glycinate 200-400 mg at bedtime Encouraged good sleep hygiene such as dark, quiet room. No blue/green glowing lights such as computer screens in bedroom. No alcohol or stimulants in evening. Cut down on caffeine as able. Regular exercise is helpful but not just prior to bed time.    Insomnia Insomnia is a sleep disorder that makes it difficult to fall asleep or stay asleep. Insomnia can cause fatigue, low energy, difficulty concentrating, mood swings, and poor performance at work or school. There are three different ways to classify insomnia: Difficulty falling asleep. Difficulty staying asleep. Waking up too early in the morning. Any type of insomnia can be long-term (chronic) or short-term (acute). Both are common. Short-term insomnia usually lasts for three months or less. Chronic insomnia occurs at least three times a week for longer than three months. What are the causes? Insomnia may be caused by another condition, situation, or substance, such as: Anxiety. Certain medicines. Gastroesophageal reflux disease (GERD) or other gastrointestinal conditions. Asthma or other breathing conditions. Restless legs syndrome, sleep apnea, or other sleep disorders. Chronic pain. Menopause. Stroke. Abuse of alcohol, tobacco, or illegal drugs. Mental health conditions, such as depression. Caffeine. Neurological disorders, such as Alzheimer's disease. An overactive thyroid (hyperthyroidism). Sometimes, the cause of insomnia may not be known. What increases the risk? Risk factors for insomnia include: Gender. Women are affected more often than men. Age. Insomnia is more common as you get older. Stress. Lack of exercise. Irregular work schedule or working night shifts. Traveling between different time zones. Certain medical and mental health conditions. What are the signs or symptoms? If you have insomnia, the main symptom is having trouble falling asleep or having trouble staying  asleep. This may lead to other symptoms, such as: Feeling fatigued or having low energy. Feeling nervous about going to sleep. Not feeling rested in the morning. Having trouble concentrating. Feeling irritable, anxious, or depressed. How is this diagnosed? This condition may be diagnosed based on: Your symptoms and medical history. Your health care provider may ask about: Your sleep habits. Any medical conditions you have. Your mental health. A physical exam. How is this treated? Treatment for insomnia depends on the cause. Treatment may focus on treating an underlying condition that is causing insomnia. Treatment may also include: Medicines to help you sleep. Counseling or therapy. Lifestyle adjustments to help you sleep better. Follow these instructions at home: Eating and drinking  Limit or avoid alcohol, caffeinated beverages, and cigarettes, especially close to bedtime. These can disrupt your sleep. Do not eat a large meal or eat spicy foods right before bedtime. This can lead to digestive discomfort that can make it hard for you to sleep. Sleep habits  Keep a sleep diary to help you and your health care provider figure out what could be causing your insomnia. Write down: When you sleep. When you wake up during the night. How well you sleep. How rested you feel the next day. Any side effects of medicines you are taking. What you eat and drink. Make your bedroom a dark, comfortable place where it is easy to fall asleep. Put up shades or blackout curtains to block light from outside. Use a white noise machine to block noise. Keep the temperature cool. Limit screen use before bedtime. This includes: Watching TV. Using your smartphone, tablet, or computer. Stick to a routine that includes going to bed and waking up at the same times every day and night. This can help you fall asleep faster. Consider making a  quiet activity, such as reading, part of your nighttime routine. Try  to avoid taking naps during the day so that you sleep better at night. Get out of bed if you are still awake after 15 minutes of trying to sleep. Keep the lights down, but try reading or doing a quiet activity. When you feel sleepy, go back to bed. General instructions Take over-the-counter and prescription medicines only as told by your health care provider. Exercise regularly, as told by your health care provider. Avoid exercise starting several hours before bedtime. Use relaxation techniques to manage stress. Ask your health care provider to suggest some techniques that may work well for you. These may include: Breathing exercises. Routines to release muscle tension. Visualizing peaceful scenes. Make sure that you drive carefully. Avoid driving if you feel very sleepy. Keep all follow-up visits as told by your health care provider. This is important. Contact a health care provider if: You are tired throughout the day. You have trouble in your daily routine due to sleepiness. You continue to have sleep problems, or your sleep problems get worse. Get help right away if: You have serious thoughts about hurting yourself or someone else. If you ever feel like you may hurt yourself or others, or have thoughts about taking your own life, get help right away. You can go to your nearest emergency department or call: Your local emergency services (911 in the [place] A suicide crisis helpline, such as the [place] at 316-865-0183 or 988 in the [place]. This is open 24 hours a day. Summary Insomnia is a sleep disorder that makes it difficult to fall asleep or stay asleep. Insomnia can be long-term (chronic) or short-term (acute). Treatment for insomnia depends on the cause. Treatment may focus on treating an underlying condition that is causing insomnia. Keep a sleep diary to help you and your health care provider figure out what could be causing your insomnia. This  information is not intended to replace advice given to you by your health care provider. Make sure you discuss any questions you have with your health care provider. Document Revised: 03/26/2021 Document Reviewed: 07/11/2020 Elsevier Patient Education  2022 [employer].

## 2021-09-29 NOTE — Assessment & Plan Note (Signed)
Encourage heart healthy diet such as MIND or DASH diet, increase exercise, avoid trans fats, simple carbohydrates and processed foods, consider a krill or fish or flaxseed oil cap daily.  °

## 2021-09-29 NOTE — Assessment & Plan Note (Signed)
Hydrate and monitor 

## 2021-09-29 NOTE — Assessment & Plan Note (Signed)
On Levothyroxine, continue to monitor 

## 2021-09-29 NOTE — Telephone Encounter (Signed)
Per ov notes, patient needs a follow up in 2 months. Does not want to see another provider and would like to see if she could get an opening with Dr. Charlett Blake please advise.

## 2021-09-29 NOTE — Assessment & Plan Note (Signed)
hgba1c acceptable, minimize simple carbs. Increase exercise as tolerated.  

## 2021-10-01 ENCOUNTER — Other Ambulatory Visit: Payer: Self-pay

## 2021-10-01 DIAGNOSIS — E875 Hyperkalemia: Secondary | ICD-10-CM

## 2021-10-01 NOTE — Telephone Encounter (Signed)
Done

## 2021-10-06 ENCOUNTER — Other Ambulatory Visit (INDEPENDENT_AMBULATORY_CARE_PROVIDER_SITE_OTHER): Payer: Medicare PPO

## 2021-10-06 DIAGNOSIS — E875 Hyperkalemia: Secondary | ICD-10-CM | POA: Diagnosis not present

## 2021-10-06 LAB — COMPREHENSIVE METABOLIC PANEL
ALT: 13 U/L (ref 0–35)
AST: 17 U/L (ref 0–37)
Albumin: 4.2 g/dL (ref 3.5–5.2)
Alkaline Phosphatase: 77 U/L (ref 39–117)
BUN: 20 mg/dL (ref 6–23)
CO2: 26 mEq/L (ref 19–32)
Calcium: 9.3 mg/dL (ref 8.4–10.5)
Chloride: 100 mEq/L (ref 96–112)
Creatinine, Ser: 1.01 mg/dL (ref 0.40–1.20)
GFR: 49.68 mL/min — ABNORMAL LOW (ref >60.00)
Glucose, Bld: 95 mg/dL (ref 70–99)
Potassium: 4.4 mEq/L (ref 3.5–5.1)
Sodium: 135 mEq/L (ref 135–145)
Total Bilirubin: 0.4 mg/dL (ref 0.2–1.2)
Total Protein: 7.2 g/dL (ref 6.0–8.3)

## 2021-10-06 NOTE — Addendum Note (Signed)
Addended by: Manuela Schwartz on: 10/06/2021 09:14 AM   Modules accepted: Orders

## 2021-10-06 NOTE — Addendum Note (Signed)
Addended by: Manuela Schwartz on: 10/06/2021 09:13 AM   Modules accepted: Orders

## 2021-10-08 ENCOUNTER — Other Ambulatory Visit: Payer: Medicare PPO

## 2021-10-12 ENCOUNTER — Encounter: Payer: Self-pay | Admitting: Family Medicine

## 2021-11-11 ENCOUNTER — Other Ambulatory Visit: Payer: Self-pay | Admitting: Family Medicine

## 2021-11-11 DIAGNOSIS — E079 Disorder of thyroid, unspecified: Secondary | ICD-10-CM

## 2021-11-18 ENCOUNTER — Other Ambulatory Visit: Payer: Self-pay | Admitting: Family Medicine

## 2021-11-18 ENCOUNTER — Encounter: Payer: Self-pay | Admitting: Family Medicine

## 2021-11-18 DIAGNOSIS — E079 Disorder of thyroid, unspecified: Secondary | ICD-10-CM

## 2021-11-18 MED ORDER — LEVOTHYROXINE SODIUM 25 MCG PO TABS: ORAL_TABLET | ORAL | 1 refills | Status: DC

## 2021-12-02 ENCOUNTER — Ambulatory Visit: Payer: Medicare PPO | Admitting: Physician Assistant

## 2021-12-02 ENCOUNTER — Other Ambulatory Visit: Payer: Self-pay

## 2021-12-02 ENCOUNTER — Encounter: Payer: Self-pay | Admitting: Physician Assistant

## 2021-12-02 VITALS — BP 152/61 | HR 98 | Resp 18 | Ht 62.0 in | Wt 118.0 lb

## 2021-12-02 DIAGNOSIS — G3184 Mild cognitive impairment, so stated: Secondary | ICD-10-CM | POA: Diagnosis not present

## 2021-12-02 NOTE — Progress Notes (Signed)
? ?Assessment/Plan:  ? ? ?Mild Cognitive Impairment without behavioral disturbance ? ?No changes from her prior visit.  MMSE today 29/30.  Anxiety still present, as well as mild insomnia, which is to be addressed by her PCP.  Patient is currently on trazodone 25 to 50 mg as needed with some improvement.  Requests at this time to follow-up once a year unless her memory status changes.  Since she is stable from the cognitive standpoint, it is adequate to do so.  Her daughter agrees. ? ?Recommendations:  ? ?Discussed safety both in and out of the home.  ?Discussed the importance of regular daily schedule with inclusion of crossword puzzles to maintain brain function.  ?Continue to monitor mood by PCP Continue trazodone. ?Consider relaxation techniques for anxiety and sleep.  ?Stay active at least 30 minutes at least 3 times a week.  ?Naps should be scheduled and should be no longer than 60 minutes and should not occur after 2 PM.  ?Mediterranean diet is recommended  ?Control of cardiovascular risk factors ?Monitor driving ?Follow up in 1 year per patient's request ?Continue B12 supplements daily  ? ? ?Case discussed with Dr. Delice Lesch who agrees with the plan ? ? ? ? ?Subjective:  ? ?ED visits since last seen: none  ?Hospital admissions: none ? ?Molly Williams is a 88 y.o. female with risk factors including  age, hypertension, hyperlipidemia, B hearing loss, SLE, history of migraines, hypothyroidism, B12 deficiency, Vit D deficiency, seen today for evaluation of Mild Cognitive Impairment,  She was las seen on 06/03/21. MRI of the brain was remarkable for moderate atrophy and chronic microvascular ischemic disease without acute findings. Last MMSE was 26/30. Not on [facility] at this time, patient is not interested unless memory worse. She is accompanied in the office by daughter [name] who supplements the history.  Previous records as well as any outside records available were reviewed prior to todays visit.   ? ?Since her  last visit, she feels that her memory is about the same, especially her short term.  She has "some good days and some bad days ".  Her daughter [name] reports that she is much sharper in the morning, worse during the afternoon.  She still has issues not recalling conversations that took place at the beginning of the day.  There is some difficulty concentrating exacerbated by anxiety. She lives alone and her daughter monitors her for safety. She has encountered some situations in which she may be taken advantage of "she has been helping neighbors move the trash cans and I am concerned that she will fall one day ".  She also had some issues with some scams, while pain to charities that they may not exist, but [name] is tightening that control.  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  She is overall independent in all her ADLs, very sociable.  Her mood is very happy, at times anxious.  Denies irritability, hallucinations or paranoia.  She exercises daily.  There is no wandering behavior.  Her sleep is "not that good, maybe a little bit better with trazodone, I cannot tell when I have been taking it, then I have a harder time falling back asleep ".  She is on top of her medications and paying bills, denies missing any.  She is independent of bathing and dressing, not require any assistance at this time.  Her appetite is  good, she eats a gluten-free diet, enjoys cooking and denies leaving the stove on.  She denies forgetting any common recipes, favorite one being [place].  She ambulates well, does not required the use of a walker or a cane.  She continues to drive short distances, especially to the shopping center at the hair salon without getting lost.  She denies any recent headaches, falls, head injuries, double vision, dizziness, unilateral weakness, incontinence, retention, constipation or diarrhea. ? ?  Initial Evaluation 04/24/21 ?The patient is seen in neurologic consultation at the request of Mosie Lukes, MD for the evaluation of memory.  The patient is accompanied by daughter  who supplements the history. ?She is a 88 y.o. year old female who has had memory issues for about 3 to 4 years, when she noticed that her instant recall and short-term memory was "not good, [name] has to remind me ".  She is worried that very specific information "is gone "some days are worse than others.  This creates anxiety, at which time "the memory is not existent".  She is also concerned about memory storage, but daughter reports that she is much sharper in the morning, and later in the day is when she begins to have trouble remembering.  If the conversation went on, at the end of the day she will not have any recall of the conversation.  She admits to have difficulty concentrating.  She lives alone, and is independent of dressing and changing.  She is very sociable, and she reports her mood being "happy, thankful, and likes to be helpful to other people ".  After her husband's death, the patient may have had some degree of depression, but she is now better, having found new friends, and began dating, which has helped her emotionally.  She denies irritability she likes to exercise daily.  She reports her sleep as "has always been terrible, ever since I was young, and he used to work as a Social research officer, government, there is always something in my mind ".  During the night, sometimes she wakes up at 3 AM, and keeps herself busy, without going back to sleep immediately.  Because she is tired during the day, then she takes a small nap feeling refreshed.  She denies any vivid dreams or sleepwalking.  No hallucinations or paranoia.  She denies placing objects in unusual spots. She takes her own medications, without forgetting any doses.  She still manages her finances without missing any bills.  Her appetite is good, denies trouble swallowing.  She  enjoys cooking, and denies leaving the stove on. She has not forgotten her common recipes, her favorite one is Investment banker, operational. She ambulates without difficulty without the use of a walker or a cane.  She drives getting lost.  She only drives to places that she is familiar with.She has a history of classic headaches, with aura, but she has not have a months.  She denies any falls, head injuries, double vision, dizziness unilateral weakness or retention, constipation or diarrhea, or anosmia.  Denies history of deep apnea.  She drinks about 10 glasses of wine a week.   Family history significant stroke in several members of her family, and NPH  with dementia in brother ? ?Labs 8/11/22c TSH 4.16, B12 186 (low)  ? ?MRI brain wo contrast 05/09/21 No evidence of acute intracranial abnormality or reversible cause of memory loss.: No acute infarction, hemorrhage, hydrocephalus, extra-axial collection or mass lesion. Moderate atrophy and chronic microvascular ischemic disease. ?  ?  ?  PREVIOUS MEDICATIONS:  ? ?CURRENT MEDICATIONS:  ?Outpatient Encounter Medications as of 12/02/2021  ?Medication Sig  ? acetaminophen (TYLENOL) 500 MG tablet Take 1,000 mg by mouth every 6 (six) hours as needed. pain  ? albuterol (VENTOLIN HFA) 108 (90 Base) MCG/ACT inhaler Inhale 2 puffs into the lungs every 6 (six) hours as needed for wheezing or shortness of breath.  ? aspirin-acetaminophen-caffeine (EXCEDRIN MIGRAINE) 250-250-65 MG tablet Take by mouth every 6 (six) hours as needed for headache.  ? Cholecalciferol (VITAMIN D) 2000 units CAPS Take 2,000 Units by mouth daily.  ? famotidine (PEPCID) 20 MG tablet Take 20 mg by mouth daily as needed for heartburn.   ? levothyroxine (SYNTHROID) 25 MCG tablet TAKE 1 TABLET BY MOUTH ONCE DAILY EXCEPT  TAKE  2  TABLETS  ON  TUESDAY  AND  SATURDAY.  ? omeprazole (PRILOSEC) 20 MG capsule Take 1 capsule (20 mg total) by mouth daily.  ? traZODone (DESYREL) 50 MG tablet Take 0.5-1 tablets (25-50 mg total) by mouth  at bedtime as needed for sleep.  ? Turmeric 500 MG CAPS Take 1 capsule by mouth daily.  ? ?No facility-administered encounter medications on file as of 12/02/2021.  ? ? ? ?Objective:  ?  ? ?PHYSICAL EXAMIN

## 2021-12-02 NOTE — Patient Instructions (Addendum)
It was a pleasure to see you today at our office. You are  doing great! ? ?Recommendations: ? ?Follow up in 1 year ?Try meditation and breathing excercises to help calm down and sleep better  ? ? ?RECOMMENDATIONS FOR ALL PATIENTS WITH MEMORY PROBLEMS: ?1. Continue to exercise (Recommend 30 minutes of walking everyday, or 3 hours every week) ?2. Increase social interactions - continue going to [place] and enjoy social gatherings with friends and family ?3. Eat healthy, avoid fried foods and eat more fruits and vegetables ?4. Maintain adequate blood pressure, blood sugar, and blood cholesterol level. Reducing the risk of stroke and cardiovascular disease also helps promoting better memory. ?5. Avoid stressful situations. Live a simple life and avoid aggravations. Organize your time and prepare for the next day in anticipation. ?6. Sleep well, avoid any interruptions of sleep and avoid any distractions in the bedroom that may interfere with adequate sleep quality ?7. Avoid sugar, avoid sweets as there is a strong link between excessive sugar intake, diabetes, and cognitive impairment ?We discussed the Mediterranean diet, which has been shown to help patients reduce the risk of progressive memory disorders and reduces cardiovascular risk. This includes eating fish, eat fruits and green leafy vegetables, nuts like almonds and hazelnuts, walnuts, and also use olive oil. Avoid fast foods and fried foods as much as possible. Avoid sweets and sugar as sugar use has been linked to worsening of memory function. ? ?There is always a concern of gradual progression of memory problems. If this is the case, then we may need to adjust level of care according to patient needs. Support, both to the patient and caregiver, should then be put into place.  ? ? ?  ? ? ?FALL PRECAUTIONS: Be cautious when walking. Scan the area for obstacles that may increase the risk of trips and falls. When getting up in the mornings, sit up at the edge of  the bed for a few minutes before getting out of bed. Consider elevating the bed at the head end to avoid drop of blood pressure when getting up. Walk always in a well-lit room (use night lights in the walls). Avoid area rugs or power cords from appliances in the middle of the walkways. Use a walker or a cane if necessary and consider physical therapy for balance exercise. Get your eyesight checked regularly. ? ?FINANCIAL OVERSIGHT: Supervision, especially oversight when making financial decisions or transactions is also recommended. ? ?HOME SAFETY: Consider the safety of the kitchen when operating appliances like stoves, microwave oven, and blender. Consider having supervision and share cooking responsibilities until no longer able to participate in those. Accidents with firearms and other hazards in the house should be identified and addressed as well. ? ? ?ABILITY TO BE LEFT ALONE: If patient is unable to contact 911 operator, consider using LifeLine, or when the need is there, arrange for someone to stay with patients. Smoking is a fire hazard, consider supervision or cessation. Risk of wandering should be assessed by caregiver and if detected at any point, supervision and safe proof recommendations should be instituted. ? ?MEDICATION SUPERVISION: Inability to self-administer medication needs to be constantly addressed. Implement a mechanism to ensure safe administration of the medications. ? ? ?DRIVING: Regarding driving, in patients with progressive memory problems, driving will be impaired. We advise to have someone else do the driving if trouble finding directions or if minor accidents are reported. Independent driving assessment is available to determine safety of driving. ? ? ?If you are  interested in the driving assessment, you can contact the following: ? ?[facility] [employer] in [place] ? ?[place] [phone] ? ?[facility] [phone] ? ?[facility] [phone] or [phone] ? ? ? ?Mediterranean Diet ?A Mediterranean diet refers to food and lifestyle choices that are based on the traditions of countries located on the [employer]. This way of eating has been shown to help prevent certain conditions and improve outcomes for people who have chronic diseases, like kidney disease and heart disease. ?What are tips for following this plan? ?Lifestyle  ?Cook and eat meals together with your family, when possible. ?Drink enough fluid to keep your urine clear or pale yellow. ?Be physically active every day. This includes: ?Aerobic exercise like running or swimming. ?Leisure activities like gardening, walking, or housework. ?Get 7-8 hours of sleep each night. ?If recommended by your health care provider, drink red wine in moderation. This means 1 glass a day for nonpregnant women and 2 glasses a day for men. A glass of wine equals 5 oz (150 mL). ?Reading food labels  ?Check the serving size of packaged foods. For foods such as rice and pasta, the serving size refers to the amount of cooked product, not dry. ?Check the total fat in packaged foods. Avoid foods that have saturated fat or trans fats. ?Check the ingredients list for added sugars, such as corn syrup. ?Shopping  ?At the grocery store, buy most of your food from the areas near the walls of the store. This includes: ?Fresh fruits and vegetables (produce). ?Grains, beans, nuts, and seeds. Some of these may be available in unpackaged forms or large amounts (in bulk). ?Fresh seafood. ?Poultry and eggs. ?Low-fat dairy products. ?Buy whole ingredients instead of prepackaged foods. ?Buy fresh fruits and vegetables in-season from local farmers markets. ?Buy frozen fruits and vegetables in resealable bags. ?If you do not have access to quality fresh seafood, buy precooked frozen shrimp or canned fish, such as tuna, salmon, or sardines. ?Buy small amounts of raw or cooked vegetables, salads, or olives from  the deli or salad bar at your store. ?Stock your pantry so you always have certain foods on hand, such as olive oil, canned tuna, canned tomatoes, rice, pasta, and beans. ?Cooking  ?Cook foods with extra-virgin olive oil instead of using butter or other vegetable oils. ?Have meat as a side dish, and have vegetables or grains as your main dish. This means having meat in small portions or adding small amounts of meat to foods like pasta or stew. ?Use beans or vegetables instead of meat in common dishes like chili or lasagna. ?Experiment with different cooking methods. Try roasting or broiling vegetables instead of steaming or saut?eing them. ?Add frozen vegetables to soups, stews, pasta, or rice. ?Add nuts or seeds for added healthy fat at each meal. You can add these to yogurt, salads, or vegetable dishes. ?Marinate fish or vegetables using olive oil, lemon juice, garlic, and fresh herbs. ?Meal planning  ?Plan to eat 1 vegetarian meal one day each week. Try to work up to 2 vegetarian meals, if possible. ?Eat seafood 2 or more times a week. ?Have healthy snacks readily available, such as: ?Vegetable sticks with hummus. ?[place] yogurt. ?Fruit and nut trail mix. ?Eat balanced meals throughout the week. This includes: ?Fruit: 2-3 servings a day ?Vegetables: 4-5 servings a day ?Low-fat dairy: 2 servings a day ?Fish, poultry, or lean meat: 1 serving a day ?Beans and legumes: 2 or more servings a week ?  Nuts and seeds: 1-2 servings a day ?Whole grains: 6-8 servings a day ?Extra-virgin olive oil: 3-4 servings a day ?Limit red meat and sweets to only a few servings a month ?What are my food choices? ?Mediterranean diet ?Recommended ?Grains: Whole-grain pasta. Brown rice. Bulgar wheat. Polenta. Couscous. Whole-wheat bread. [place]. ?Vegetables: Artichokes. Beets. Broccoli. Cabbage. Carrots. Eggplant. Green beans. Chard. Kale. Spinach. Onions. Leeks. Peas. Squash. Tomatoes. Peppers. Radishes. ?Fruits: Apples. Apricots.  Avocado. Berries. Bananas. Cherries. Dates. Figs. Grapes. Lemons. Melon. Oranges. Peaches. Plums. Pomegranate. ?Meats and other protein foods: Beans. Almonds. Sunflower seeds. Pine nuts. Peanuts. [place]. S

## 2021-12-30 ENCOUNTER — Other Ambulatory Visit: Payer: Self-pay | Admitting: Family Medicine

## 2021-12-30 DIAGNOSIS — E079 Disorder of thyroid, unspecified: Secondary | ICD-10-CM

## 2022-01-05 NOTE — Progress Notes (Signed)
? ?Subjective:  ? ? Patient ID: Molly Williams, female    DOB: 01/08/1931, 88 y.o.   MRN: [id number] ? ?Chief Complaint  ?Patient presents with  ? Follow-up  ? Skin Problem  ? ? ?HPI ?Patient is in today for a follow up and she is accompanied by her daughter. They deny any recent febrile illness or hospitalizations. She is concerned about her memory and she notes it seems to be worse in the afternoon when she is tired. If she rests it is some better. She also notes a recurrent lesion is scaly on her left eyelid and one on her scalp no itching or pain. Denies CP/palp/SOB/HA/congestion/fevers/GI or GU c/o. Taking meds as prescribed  ? ?Past Medical History:  ?Diagnosis Date  ? Anemia 03/29/2015  ? Arthritis   ? Atrophic vaginitis   ? Cholelithiasis 2008  ? Fibrocystic breast disease   ? bx 2008 neg per pt report  ? GERD (gastroesophageal reflux disease)   ? Hearing loss   ? Hearing loss sensory, bilateral   ? bil/hearing aids  ? Hematuria   ? chronic Dr. Burnell Blanks  ? Hyperlipidemia 11/17/2016  ? Lupus (systemic lupus erythematosus) ([place])   ? Dr. Charlestine Night  ? Macular degeneration 11/17/2016  ? Migraine aura without headache   ? h/o ocular migraines  ? Osteopenia   ? Preventative health care 11/18/2016  ? Renal insufficiency 03/29/2015  ? Retinal detachment with break   ? right/laser  ? Retinal tear of right eye 11/17/2016  ? Skin lesion of face 01/09/2016  ? Spinal stenosis   ? Thyroid disease   ? hypothyroidism  ? Urinary tract bacterial infections   ? has had urethral dilations in past  ? Vitamin B12 deficiency 01/09/2016  ? Vitamin D deficiency 07/11/2015  ? ? ?Past Surgical History:  ?Procedure Laterality Date  ? APPENDECTOMY  2006  ? CATARACT EXTRACTION W/ INTRAOCULAR LENS  IMPLANT, BILATERAL    ? CHOLECYSTECTOMY    ? EYE SURGERY Bilateral   ? cataracts  ? TONSILLECTOMY  1940  ? VAGINAL HYSTERECTOMY    ? partial  ? ? ?Family History  ?Problem Relation Age of Onset  ? Stroke Paternal Grandmother   ? Stroke Father   ? Coronary  artery disease Father   ? Coronary artery disease Mother   ? Stroke Mother   ? Cancer Paternal Aunt   ?     GYN cancer  ? Cancer Paternal Uncle   ?     lung, smoker  ? Stroke Maternal Grandmother   ? Stroke Maternal Grandfather   ? Heart disease Paternal Grandfather   ? Hydrocephalus Brother   ?     normopressure hydrocephalus, dementia  ? ? ?Social History  ? ?Socioeconomic History  ? Marital status: Widowed  ?  Spouse name: Not on file  ? Number of children: Not on file  ? Years of education: 63  ? Highest education level: Not on file  ?Occupational History  ? Not on file  ?Tobacco Use  ? Smoking status: Former  ?  Types: Cigarettes  ?  Quit date: 1970  ?  Years since quitting: 53.3  ? Smokeless tobacco: Never  ?Substance and Sexual Activity  ? Alcohol use: Yes  ?  Alcohol/week: 10.0 standard drinks  ?  Types: 10 Glasses of wine per week  ? Drug use: No  ? Sexual activity: Not Currently  ?  Comment: lives alone here because her daughter lives hier, no dietary restrictions, avoids  dairy  ?Other Topics Concern  ? Not on file  ?Social History Narrative  ? Right hand  ? Caffeine yes  ? One story home  ? ?Social Determinants of Health  ? ?Financial Resource Strain: Low Risk   ? Difficulty of Paying Living Expenses: Not hard at all  ?Food Insecurity: No Food Insecurity  ? Worried About Charity fundraiser in the Last Year: Never true  ? Ran Out of Food in the Last Year: Never true  ?Transportation Needs: No Transportation Needs  ? Lack of Transportation (Medical): No  ? Lack of Transportation (Non-Medical): No  ?Physical Activity: Not on file  ?Stress: No Stress Concern Present  ? Feeling of Stress : Not at all  ?Social Connections: Moderately Integrated  ? Frequency of Communication with Friends and Family: More than three times a week  ? Frequency of Social Gatherings with Friends and Family: More than three times a week  ? Attends Religious Services: More than 4 times per year  ? Active Member of Clubs or  Organizations: Yes  ? Attends Archivist Meetings: 1 to 4 times per year  ? Marital Status: Widowed  ?Intimate Partner Violence: Not At Risk  ? Fear of Current or Ex-Partner: No  ? Emotionally Abused: No  ? Physically Abused: No  ? Sexually Abused: No  ? ? ?Outpatient Medications Prior to Visit  ?Medication Sig Dispense Refill  ? acetaminophen (TYLENOL) 500 MG tablet Take 1,000 mg by mouth every 6 (six) hours as needed. pain    ? albuterol (VENTOLIN HFA) 108 (90 Base) MCG/ACT inhaler Inhale 2 puffs into the lungs every 6 (six) hours as needed for wheezing or shortness of breath. 18 g 3  ? aspirin-acetaminophen-caffeine (EXCEDRIN MIGRAINE) 465-035-46 MG tablet Take by mouth every 6 (six) hours as needed for headache.    ? Cholecalciferol (VITAMIN D) 2000 units CAPS Take 2,000 Units by mouth daily.    ? famotidine (PEPCID) 20 MG tablet Take 20 mg by mouth daily as needed for heartburn.     ? levothyroxine (SYNTHROID) 25 MCG tablet TAKE 1 TABLET BY MOUTH ONCE DAILY EXCEPT  TAKE  2  TABLETS  ON  TUESDAY  AND  SATURDAY. 60 tablet 0  ? omeprazole (PRILOSEC) 20 MG capsule Take 1 capsule (20 mg total) by mouth daily. 90 capsule 1  ? traZODone (DESYREL) 50 MG tablet Take 0.5-1 tablets (25-50 mg total) by mouth at bedtime as needed for sleep. 30 tablet 3  ? Turmeric 500 MG CAPS Take 1 capsule by mouth daily.    ? ?No facility-administered medications prior to visit.  ? ? ?Allergies  ?Allergen Reactions  ? Bee Venom Anaphylaxis  ? Codeine Nausea And Vomiting and Nausea Only  ? Iodine Hives  ?  Contrast dye  ? Alendronate   ? Bacitracin Swelling  ?  Ophthalmic ointment  ? Iodinated Contrast Media   ? Bactrim  [Sulfamethoxazole-Trimethoprim] Hives, Itching and Rash  ? ? ?Review of Systems  ?Constitutional:  Positive for malaise/fatigue. Negative for fever.  ?HENT:  Negative for congestion.   ?Eyes:  Negative for blurred vision.  ?Respiratory:  Negative for shortness of breath.   ?Cardiovascular:  Negative for chest  pain, palpitations and leg swelling.  ?Gastrointestinal:  Negative for abdominal pain, blood in stool and nausea.  ?Genitourinary:  Negative for dysuria and frequency.  ?Musculoskeletal:  Negative for falls.  ?Skin:  Negative for rash.  ?Neurological:  Negative for dizziness, loss of consciousness and headaches.  ?  Endo/Heme/Allergies:  Negative for environmental allergies.  ?Psychiatric/Behavioral:  Positive for memory loss. Negative for depression. The patient is nervous/anxious and has insomnia.   ? ?   ?Objective:  ?  ?Physical Exam ?Constitutional:   ?   General: She is not in acute distress. ?   Appearance: She is well-developed.  ?HENT:  ?   Head: Normocephalic and atraumatic.  ?Eyes:  ?   Conjunctiva/sclera: Conjunctivae normal.  ?Neck:  ?   Thyroid: No thyromegaly.  ?Cardiovascular:  ?   Rate and Rhythm: Normal rate and regular rhythm.  ?   Heart sounds: Normal heart sounds. No murmur heard. ?Pulmonary:  ?   Effort: Pulmonary effort is normal. No respiratory distress.  ?   Breath sounds: Normal breath sounds.  ?Abdominal:  ?   General: Bowel sounds are normal. There is no distension.  ?   Palpations: Abdomen is soft. There is no mass.  ?   Tenderness: There is no abdominal tenderness.  ?Musculoskeletal:  ?   Cervical back: Neck supple.  ?Lymphadenopathy:  ?   Cervical: No cervical adenopathy.  ?Skin: ?   General: Skin is warm and dry.  ?Neurological:  ?   Mental Status: She is alert and oriented to person, place, and time.  ?Psychiatric:     ?   Behavior: Behavior normal.  ? ? ?BP (!) 122/52 (BP Location: Left Arm, Patient Position: Sitting, Cuff Size: Normal)   Pulse 66   Resp 20   Ht '5\' 2"'$  (1.575 m)   Wt 117 lb 12.8 oz (53.4 kg)   SpO2 94%   BMI 21.55 kg/m?  ?Wt Readings from Last 3 Encounters:  ?01/06/22 117 lb 12.8 oz (53.4 kg)  ?12/02/21 118 lb (53.5 kg)  ?09/29/21 116 lb (52.6 kg)  ? ? ?Diabetic Foot Exam - Simple   ?No data filed ?  ? ?Lab Results  ?Component Value Date  ? WBC 6.1 09/29/2021  ?  HGB 12.7 09/29/2021  ? HCT 38.4 09/29/2021  ? PLT 303.0 09/29/2021  ? GLUCOSE 95 10/06/2021  ? CHOL 228 (H) 09/29/2021  ? TRIG 104.0 09/29/2021  ? HDL 106.10 09/29/2021  ? LDLCALC 101 (H) 09/29/2021  ? ALT 13 10/06/2021

## 2022-01-06 ENCOUNTER — Encounter: Payer: Self-pay | Admitting: Family Medicine

## 2022-01-06 ENCOUNTER — Ambulatory Visit (INDEPENDENT_AMBULATORY_CARE_PROVIDER_SITE_OTHER): Payer: Medicare PPO | Admitting: Family Medicine

## 2022-01-06 VITALS — BP 122/52 | HR 66 | Resp 20 | Ht 62.0 in | Wt 117.8 lb

## 2022-01-06 DIAGNOSIS — N289 Disorder of kidney and ureter, unspecified: Secondary | ICD-10-CM

## 2022-01-06 DIAGNOSIS — D649 Anemia, unspecified: Secondary | ICD-10-CM

## 2022-01-06 DIAGNOSIS — E782 Mixed hyperlipidemia: Secondary | ICD-10-CM

## 2022-01-06 DIAGNOSIS — M810 Age-related osteoporosis without current pathological fracture: Secondary | ICD-10-CM | POA: Diagnosis not present

## 2022-01-06 DIAGNOSIS — E538 Deficiency of other specified B group vitamins: Secondary | ICD-10-CM

## 2022-01-06 DIAGNOSIS — G3184 Mild cognitive impairment, so stated: Secondary | ICD-10-CM

## 2022-01-06 DIAGNOSIS — E079 Disorder of thyroid, unspecified: Secondary | ICD-10-CM

## 2022-01-06 DIAGNOSIS — G4700 Insomnia, unspecified: Secondary | ICD-10-CM | POA: Diagnosis not present

## 2022-01-06 DIAGNOSIS — E875 Hyperkalemia: Secondary | ICD-10-CM

## 2022-01-06 DIAGNOSIS — L578 Other skin changes due to chronic exposure to nonionizing radiation: Secondary | ICD-10-CM

## 2022-01-06 DIAGNOSIS — E559 Vitamin D deficiency, unspecified: Secondary | ICD-10-CM

## 2022-01-06 DIAGNOSIS — R739 Hyperglycemia, unspecified: Secondary | ICD-10-CM

## 2022-01-06 NOTE — Patient Instructions (Addendum)
Belsomra for sleep  if Trazodone is not helpful ? ?Insomnia ?Insomnia is a sleep disorder that makes it difficult to fall asleep or stay asleep. Insomnia can cause fatigue, low energy, difficulty concentrating, mood swings, and poor performance at work or school. ?There are three different ways to classify insomnia: ?Difficulty falling asleep. ?Difficulty staying asleep. ?Waking up too early in the morning. ?Any type of insomnia can be long-term (chronic) or short-term (acute). Both are common. Short-term insomnia usually lasts for 3 months or less. Chronic insomnia occurs at least three times a week for longer than 3 months. ?What are the causes? ?Insomnia may be caused by another condition, situation, or substance, such as: ?Having certain mental health conditions, such as anxiety and depression. ?Using caffeine, alcohol, tobacco, or drugs. ?Having gastrointestinal conditions, such as gastroesophageal reflux disease (GERD). ?Having certain medical conditions. These include: ?Asthma. ?Alzheimer's disease. ?Stroke. ?Chronic pain. ?An overactive thyroid gland (hyperthyroidism). ?Other sleep disorders, such as restless legs syndrome and sleep apnea. ?Menopause. ?Sometimes, the cause of insomnia may not be known. ?What increases the risk? ?Risk factors for insomnia include: ?Gender. Females are affected more often than males. ?Age. Insomnia is more common as people get older. ?Stress and certain medical and mental health conditions. ?Lack of exercise. ?Having an irregular work schedule. This may include working night shifts and traveling between different time zones. ?What are the signs or symptoms? ?If you have insomnia, the main symptom is having trouble falling asleep or having trouble staying asleep. This may lead to other symptoms, such as: ?Feeling tired or having low energy. ?Feeling nervous about going to sleep. ?Not feeling rested in the morning. ?Having trouble concentrating. ?Feeling irritable, anxious, or  depressed. ?How is this diagnosed? ?This condition may be diagnosed based on: ?Your symptoms and medical history. Your health care provider may ask about: ?Your sleep habits. ?Any medical conditions you have. ?Your mental health. ?A physical exam. ?How is this treated? ?Treatment for insomnia depends on the cause. Treatment may focus on treating an underlying condition that is causing the insomnia. Treatment may also include: ?Medicines to help you sleep. ?Counseling or therapy. ?Lifestyle adjustments to help you sleep better. ?Follow these instructions at home: ?Eating and drinking ? ?Limit or avoid alcohol, caffeinated beverages, and products that contain nicotine and tobacco, especially close to bedtime. These can disrupt your sleep. ?Do not eat a large meal or eat spicy foods right before bedtime. This can lead to digestive discomfort that can make it hard for you to sleep. ?Sleep habits ? ?Keep a sleep diary to help you and your health care provider figure out what could be causing your insomnia. Write down: ?When you sleep. ?When you wake up during the night. ?How well you sleep and how rested you feel the next day. ?Any side effects of medicines you are taking. ?What you eat and drink. ?Make your bedroom a dark, comfortable place where it is easy to fall asleep. ?Put up shades or blackout curtains to block light from outside. ?Use a white noise machine to block noise. ?Keep the temperature cool. ?Limit screen use before bedtime. This includes: ?Not watching TV. ?Not using your smartphone, tablet, or computer. ?Stick to a routine that includes going to bed and waking up at the same times every day and night. This can help you fall asleep faster. Consider making a quiet activity, such as reading, part of your nighttime routine. ?Try to avoid taking naps during the day so that you sleep better at night. ?  Get out of bed if you are still awake after 15 minutes of trying to sleep. Keep the lights down, but try  reading or doing a quiet activity. When you feel sleepy, go back to bed. ?General instructions ?Take over-the-counter and prescription medicines only as told by your health care provider. ?Exercise regularly as told by your health care provider. However, avoid exercising in the hours right before bedtime. ?Use relaxation techniques to manage stress. Ask your health care provider to suggest some techniques that may work well for you. These may include: ?Breathing exercises. ?Routines to release muscle tension. ?Visualizing peaceful scenes. ?Make sure that you drive carefully. Do not drive if you feel very sleepy. ?Keep all follow-up visits. This is important. ?Contact a health care provider if: ?You are tired throughout the day. ?You have trouble in your daily routine due to sleepiness. ?You continue to have sleep problems, or your sleep problems get worse. ?Get help right away if: ?You have thoughts about hurting yourself or someone else. ?Get help right away if you feel like you may hurt yourself or others, or have thoughts about taking your own life. Go to your nearest emergency room or: ?Call 911. ?Call the [facility] at [phone] or [phone]. This is open 24 hours a day. ?Text the Crisis Text Line at 956 492 6453. ?Summary ?Insomnia is a sleep disorder that makes it difficult to fall asleep or stay asleep. ?Insomnia can be long-term (chronic) or short-term (acute). ?Treatment for insomnia depends on the cause. Treatment may focus on treating an underlying condition that is causing the insomnia. ?Keep a sleep diary to help you and your health care provider figure out what could be causing your insomnia. ?This information is not intended to replace advice given to you by your health care provider. Make sure you discuss any questions you have with your health care provider. ?Document Revised: 08/11/2021 Document Reviewed: 08/11/2021 ?Elsevier Patient Education ? [place]. ? ?

## 2022-01-11 DIAGNOSIS — E875 Hyperkalemia: Secondary | ICD-10-CM | POA: Insufficient documentation

## 2022-01-11 DIAGNOSIS — L578 Other skin changes due to chronic exposure to nonionizing radiation: Secondary | ICD-10-CM | POA: Insufficient documentation

## 2022-01-11 NOTE — Assessment & Plan Note (Signed)
Hydrate and monitor 

## 2022-01-11 NOTE — Assessment & Plan Note (Signed)
Referred to dermatology  For evaluation ?

## 2022-01-11 NOTE — Assessment & Plan Note (Signed)
hgba1c acceptable, minimize simple carbs. Increase exercise as tolerated.  

## 2022-01-11 NOTE — Assessment & Plan Note (Signed)
Supplement and monitor 

## 2022-01-11 NOTE — Assessment & Plan Note (Signed)
Notes most trouble when she is tired especially after lunch but improves some with rest she agrees to go for neuropsych testing so they can make more informed decisions ?

## 2022-01-11 NOTE — Assessment & Plan Note (Signed)
Increase leafy greens, consider increased lean red meat and using cast iron cookware. Continue to monitor, report any concerns 

## 2022-01-11 NOTE — Assessment & Plan Note (Signed)
On Levothyroxine, continue to monitor 

## 2022-01-11 NOTE — Assessment & Plan Note (Signed)
Minimize in diet and monitor ?

## 2022-01-11 NOTE — Assessment & Plan Note (Signed)
Encourage heart healthy diet such as MIND or DASH diet, increase exercise, avoid trans fats, simple carbohydrates and processed foods, consider a krill or fish or flaxseed oil cap daily.  °

## 2022-02-18 ENCOUNTER — Other Ambulatory Visit: Payer: Self-pay | Admitting: Family Medicine

## 2022-02-18 DIAGNOSIS — E079 Disorder of thyroid, unspecified: Secondary | ICD-10-CM

## 2022-02-19 DIAGNOSIS — D23121 Other benign neoplasm of skin of left upper eyelid, including canthus: Secondary | ICD-10-CM | POA: Diagnosis not present

## 2022-02-19 DIAGNOSIS — H04123 Dry eye syndrome of bilateral lacrimal glands: Secondary | ICD-10-CM | POA: Diagnosis not present

## 2022-03-04 ENCOUNTER — Other Ambulatory Visit: Payer: Self-pay | Admitting: Family Medicine

## 2022-03-09 DIAGNOSIS — H02413 Mechanical ptosis of bilateral eyelids: Secondary | ICD-10-CM | POA: Diagnosis not present

## 2022-03-09 DIAGNOSIS — H02831 Dermatochalasis of right upper eyelid: Secondary | ICD-10-CM | POA: Diagnosis not present

## 2022-03-09 DIAGNOSIS — D485 Neoplasm of uncertain behavior of skin: Secondary | ICD-10-CM | POA: Diagnosis not present

## 2022-03-09 DIAGNOSIS — H02834 Dermatochalasis of left upper eyelid: Secondary | ICD-10-CM | POA: Diagnosis not present

## 2022-03-09 DIAGNOSIS — H04123 Dry eye syndrome of bilateral lacrimal glands: Secondary | ICD-10-CM | POA: Diagnosis not present

## 2022-03-09 DIAGNOSIS — H57813 Brow ptosis, bilateral: Secondary | ICD-10-CM | POA: Diagnosis not present

## 2022-03-30 ENCOUNTER — Ambulatory Visit (INDEPENDENT_AMBULATORY_CARE_PROVIDER_SITE_OTHER): Payer: Medicare PPO

## 2022-03-30 VITALS — Ht 62.0 in | Wt 117.0 lb

## 2022-03-30 DIAGNOSIS — Z0000 Encounter for general adult medical examination without abnormal findings: Secondary | ICD-10-CM

## 2022-03-30 DIAGNOSIS — Z1231 Encounter for screening mammogram for malignant neoplasm of breast: Secondary | ICD-10-CM | POA: Diagnosis not present

## 2022-03-30 NOTE — Patient Instructions (Signed)
Molly Williams , Thank you for taking time to complete your Medicare Wellness Visit. I appreciate your ongoing commitment to your health goals. Please review the following plan we discussed and let me know if I can assist you in the future.   Screening recommendations/referrals: Colonoscopy: No longer required Mammogram: Ordered today. Someone will call you to schedule. Bone Density: Declined Recommended yearly ophthalmology/optometry visit for glaucoma screening and checkup Recommended yearly dental visit for hygiene and checkup  Vaccinations: Influenza vaccine: Up to date Pneumococcal vaccine: Up to date Tdap vaccine: Up to date Shingles vaccine: Due-May obtain vaccine at your local pharmacy. Covid-19:Booster available at the pharmacy  Advanced directives: Copy in chart  Conditions/risks identified: See problem list  Next appointment: Follow up in one year for your annual wellness visit    Preventive Care 65 Years and Older, Female Preventive care refers to lifestyle choices and visits with your health care provider that can promote health and wellness. What does preventive care include? A yearly physical exam. This is also called an annual well check. Dental exams once or twice a year. Routine eye exams. Ask your health care provider how often you should have your eyes checked. Personal lifestyle choices, including: Daily care of your teeth and gums. Regular physical activity. Eating a healthy diet. Avoiding tobacco and drug use. Limiting alcohol use. Practicing safe sex. Taking low-dose aspirin every day. Taking vitamin and mineral supplements as recommended by your health care provider. What happens during an annual well check? The services and screenings done by your health care provider during your annual well check will depend on your age, overall health, lifestyle risk factors, and family history of disease. Counseling  Your health care provider may ask you questions  about your: Alcohol use. Tobacco use. Drug use. Emotional well-being. Home and relationship well-being. Sexual activity. Eating habits. History of falls. Memory and ability to understand (cognition). Work and work Statistician. Reproductive health. Screening  You may have the following tests or measurements: Height, weight, and BMI. Blood pressure. Lipid and cholesterol levels. These may be checked every 5 years, or more frequently if you are over 28 years old. Skin check. Lung cancer screening. You may have this screening every year starting at age 107 if you have a 30-pack-year history of smoking and currently smoke or have quit within the past 15 years. Fecal occult blood test (FOBT) of the stool. You may have this test every year starting at age 32. Flexible sigmoidoscopy or colonoscopy. You may have a sigmoidoscopy every 5 years or a colonoscopy every 10 years starting at age 99. Hepatitis C blood test. Hepatitis B blood test. Sexually transmitted disease (STD) testing. Diabetes screening. This is done by checking your blood sugar (glucose) after you have not eaten for a while (fasting). You may have this done every 1-3 years. Bone density scan. This is done to screen for osteoporosis. You may have this done starting at age 43. Mammogram. This may be done every 1-2 years. Talk to your health care provider about how often you should have regular mammograms. Talk with your health care provider about your test results, treatment options, and if necessary, the need for more tests. Vaccines  Your health care provider may recommend certain vaccines, such as: Influenza vaccine. This is recommended every year. Tetanus, diphtheria, and acellular pertussis (Tdap, Td) vaccine. You may need a Td booster every 10 years. Zoster vaccine. You may need this after age 82. Pneumococcal 13-valent conjugate (PCV13) vaccine. One dose is recommended  after age 44. Pneumococcal polysaccharide (PPSV23)  vaccine. One dose is recommended after age 18. Talk to your health care provider about which screenings and vaccines you need and how often you need them. This information is not intended to replace advice given to you by your health care provider. Make sure you discuss any questions you have with your health care provider. Document Released: 09/27/2015 Document Revised: 05/20/2016 Document Reviewed: 07/02/2015 Elsevier Interactive Patient Education  2017 [place] Prevention in the Home Falls can cause injuries. They can happen to people of all ages. There are many things you can do to make your home safe and to help prevent falls. What can I do on the outside of my home? Regularly fix the edges of walkways and driveways and fix any cracks. Remove anything that might make you trip as you walk through a door, such as a raised step or threshold. Trim any bushes or trees on the path to your home. Use bright outdoor lighting. Clear any walking paths of anything that might make someone trip, such as rocks or tools. Regularly check to see if handrails are loose or broken. Make sure that both sides of any steps have handrails. Any raised decks and porches should have guardrails on the edges. Have any leaves, snow, or ice cleared regularly. Use sand or salt on walking paths during winter. Clean up any spills in your garage right away. This includes oil or grease spills. What can I do in the bathroom? Use night lights. Install grab bars by the toilet and in the tub and shower. Do not use towel bars as grab bars. Use non-skid mats or decals in the tub or shower. If you need to sit down in the shower, use a plastic, non-slip stool. Keep the floor dry. Clean up any water that spills on the floor as soon as it happens. Remove soap buildup in the tub or shower regularly. Attach bath mats securely with double-sided non-slip rug tape. Do not have throw rugs and other things on the floor that  can make you trip. What can I do in the bedroom? Use night lights. Make sure that you have a light by your bed that is easy to reach. Do not use any sheets or blankets that are too big for your bed. They should not hang down onto the floor. Have a firm chair that has side arms. You can use this for support while you get dressed. Do not have throw rugs and other things on the floor that can make you trip. What can I do in the kitchen? Clean up any spills right away. Avoid walking on wet floors. Keep items that you use a lot in easy-to-reach places. If you need to reach something above you, use a strong step stool that has a grab bar. Keep electrical cords out of the way. Do not use floor polish or wax that makes floors slippery. If you must use wax, use non-skid floor wax. Do not have throw rugs and other things on the floor that can make you trip. What can I do with my stairs? Do not leave any items on the stairs. Make sure that there are handrails on both sides of the stairs and use them. Fix handrails that are broken or loose. Make sure that handrails are as long as the stairways. Check any carpeting to make sure that it is firmly attached to the stairs. Fix any carpet that is loose or worn. Avoid having throw  rugs at the top or bottom of the stairs. If you do have throw rugs, attach them to the floor with carpet tape. Make sure that you have a light switch at the top of the stairs and the bottom of the stairs. If you do not have them, ask someone to add them for you. What else can I do to help prevent falls? Wear shoes that: Do not have high heels. Have rubber bottoms. Are comfortable and fit you well. Are closed at the toe. Do not wear sandals. If you use a stepladder: Make sure that it is fully opened. Do not climb a closed stepladder. Make sure that both sides of the stepladder are locked into place. Ask someone to hold it for you, if possible. Clearly mark and make sure that you  can see: Any grab bars or handrails. First and last steps. Where the edge of each step is. Use tools that help you move around (mobility aids) if they are needed. These include: Canes. Walkers. Scooters. Crutches. Turn on the lights when you go into a dark area. Replace any light bulbs as soon as they burn out. Set up your furniture so you have a clear path. Avoid moving your furniture around. If any of your floors are uneven, fix them. If there are any pets around you, be aware of where they are. Review your medicines with your doctor. Some medicines can make you feel dizzy. This can increase your chance of falling. Ask your doctor what other things that you can do to help prevent falls. This information is not intended to replace advice given to you by your health care provider. Make sure you discuss any questions you have with your health care provider. Document Released: 06/27/2009 Document Revised: 02/06/2016 Document Reviewed: 10/05/2014 Elsevier Interactive Patient Education  2017 [employer].

## 2022-03-30 NOTE — Progress Notes (Signed)
Subjective:   Molly Williams is a 88 y.o. female who presents for Medicare Annual (Subsequent) preventive examination.  I connected with Kiyara today by telephone and verified that I am speaking with the correct person using two identifiers. Location patient: home Location provider: work Persons participating in the virtual visit: patient, Marine scientist.    I discussed the limitations, risks, security and privacy concerns of performing an evaluation and management service by telephone and the availability of in person appointments. I also discussed with the patient that there may be a patient responsible charge related to this service. The patient expressed understanding and verbally consented to this telephonic visit.    Interactive audio and video telecommunications were attempted between this provider and patient, however failed, due to patient having technical difficulties OR patient did not have access to video capability.  We continued and completed visit with audio only.  Some vital signs may be absent or patient reported.   Time Spent with patient on telephone encounter: 25 minutes   Review of Systems     Cardiac Risk Factors include: advanced age (>21mn, >>71women);dyslipidemia     Objective:    Today's Vitals   03/30/22 1332  Weight: 117 lb (53.1 kg)  Height: '5\' 2"'$  (1.575 m)   Body mass index is 21.4 kg/m.     03/30/2022    1:36 PM 12/02/2021   11:12 AM 06/03/2021    9:24 AM 03/24/2021    9:10 AM 03/12/2020    9:41 AM 08/09/2018    1:07 PM 09/28/2017    9:29 AM  Advanced Directives  Does Patient Have a Medical Advance Directive? Yes Yes Yes Yes Yes Yes Yes  Type of AParamedicof [place]Living will   [place]Living will [place]Living will  H[place]Living will  Does patient want to make changes to medical advance directive?     No - Patient declined No - Patient declined   Copy of  [place] Chart? Yes - validated most recent copy scanned in chart (See row information)   Yes - validated most recent copy scanned in chart (See row information) Yes - validated most recent copy scanned in chart (See row information)  Yes    Current Medications (verified) Outpatient Encounter Medications as of 03/30/2022  Medication Sig   acetaminophen (TYLENOL) 500 MG tablet Take 1,000 mg by mouth every 6 (six) hours as needed. pain   albuterol (VENTOLIN HFA) 108 (90 Base) MCG/ACT inhaler Inhale 2 puffs into the lungs every 6 (six) hours as needed for wheezing or shortness of breath.   aspirin-acetaminophen-caffeine (EXCEDRIN MIGRAINE) 250-250-65 MG tablet Take by mouth every 6 (six) hours as needed for headache.   Cholecalciferol (VITAMIN D) 2000 units CAPS Take 2,000 Units by mouth daily.   famotidine (PEPCID) 20 MG tablet Take 20 mg by mouth daily as needed for heartburn.    levothyroxine (SYNTHROID) 25 MCG tablet TAKE 1 TABLET BY MOUTH ONCE DAILY EXCEPT  TAKE  2  TABLETS  ON  TUESDAY  AND  SATURDAY.   omeprazole (PRILOSEC) 20 MG capsule Take 1 capsule (20 mg total) by mouth daily.   traZODone (DESYREL) 50 MG tablet TAKE 1/2 TO 1 (ONE-HALF TO ONE) TABLET BY MOUTH AT BEDTIME AS NEEDED FOR SLEEP   Turmeric 500 MG CAPS Take 1 capsule by mouth daily.   No facility-administered encounter medications on file as of 03/30/2022.    Allergies (verified)  Bee venom, Codeine, Iodine, Alendronate, Bacitracin, Iodinated contrast media, and Bactrim  [sulfamethoxazole-trimethoprim]   History: Past Medical History:  Diagnosis Date   Anemia 03/29/2015   Arthritis    Atrophic vaginitis    Cholelithiasis 2008   Fibrocystic breast disease    bx 2008 neg per pt report   GERD (gastroesophageal reflux disease)    Hearing loss    Hearing loss sensory, bilateral    bil/hearing aids   Hematuria    chronic Dr. Burnell Blanks   Hyperlipidemia 11/17/2016   Lupus (systemic lupus erythematosus)  ([place])    Dr. Charlestine Night   Macular degeneration 11/17/2016   Migraine aura without headache    h/o ocular migraines   Osteopenia    Preventative health care 11/18/2016   Renal insufficiency 03/29/2015   Retinal detachment with break    right/laser   Retinal tear of right eye 11/17/2016   Skin lesion of face 01/09/2016   Spinal stenosis    Thyroid disease    hypothyroidism   Urinary tract bacterial infections    has had urethral dilations in past   Vitamin B12 deficiency 01/09/2016   Vitamin D deficiency 07/11/2015   Past Surgical History:  Procedure Laterality Date   APPENDECTOMY  2006   CATARACT EXTRACTION W/ INTRAOCULAR LENS  IMPLANT, BILATERAL     CHOLECYSTECTOMY     EYE SURGERY Bilateral    cataracts   TONSILLECTOMY  1940   VAGINAL HYSTERECTOMY     partial   Family History  Problem Relation Age of Onset   Stroke Paternal Grandmother    Stroke Father    Coronary artery disease Father    Coronary artery disease Mother    Stroke Mother    Cancer Paternal Aunt        GYN cancer   Cancer Paternal Uncle        lung, smoker   Stroke Maternal Grandmother    Stroke Maternal Grandfather    Heart disease Paternal Grandfather    Hydrocephalus Brother        normopressure hydrocephalus, dementia   Social History   Socioeconomic History   Marital status: Widowed    Spouse name: Not on file   Number of children: Not on file   Years of education: 13   Highest education level: Not on file  Occupational History   Not on file  Tobacco Use   Smoking status: Former    Types: Cigarettes    Quit date: 1970    Years since quitting: 53.5   Smokeless tobacco: Never  Substance and Sexual Activity   Alcohol use: Yes    Alcohol/week: 10.0 standard drinks of alcohol    Types: 10 Glasses of wine per week   Drug use: No   Sexual activity: Not Currently    Comment: lives alone here because her daughter lives hier, no dietary restrictions, avoids dairy  Other Topics Concern   Not on file   Social History Narrative   Right hand   Caffeine yes   One story home   Social Determinants of Health   Financial Resource Strain: Low Risk  (03/30/2022)   Overall Financial Resource Strain (CARDIA)    Difficulty of Paying Living Expenses: Not hard at all  Food Insecurity: No Food Insecurity (03/30/2022)   Hunger Vital Sign    Worried About Running Out of Food in the Last Year: Never true    Ran Out of Food in the Last Year: Never true  Transportation Needs: No Transportation Needs (  03/30/2022)   PRAPARE - Hydrologist (Medical): No    Lack of Transportation (Non-Medical): No  Physical Activity: Inactive (03/30/2022)   Exercise Vital Sign    Days of Exercise per Week: 0 days    Minutes of Exercise per Session: 0 min  Stress: No Stress Concern Present (03/30/2022)   [place]    Feeling of Stress : Not at all  Social Connections: Moderately Integrated (03/30/2022)   Social Connection and Isolation Panel [NHANES]    Frequency of Communication with Friends and Family: More than three times a week    Frequency of Social Gatherings with Friends and Family: More than three times a week    Attends Religious Services: More than 4 times per year    Active Member of [employer] or Organizations: Yes    Attends Archivist Meetings: More than 4 times per year    Marital Status: Widowed    Tobacco Counseling Counseling given: Not Answered   Clinical Intake:  Pre-visit preparation completed: Yes  Pain : No/denies pain     BMI - recorded: 21.4 Nutritional Status: BMI of 19-24  Normal Nutritional Risks: None Diabetes: No  How often do you need to have someone help you when you read instructions, pamphlets, or other written materials from your doctor or pharmacy?: 1 - Never  Diabetic?No  Interpreter Needed?: No  Information entered by :: Caroleen Hamman LPN   Activities of Daily  Living    03/30/2022    1:44 PM 03/24/2022    3:33 PM  In your present state of health, do you have any difficulty performing the following activities:  Hearing? 1 1  Comment hearing aids   Vision? 0 0  Difficulty concentrating or making decisions? 1 0  Comment occasionally-sees neurology   Walking or climbing stairs? 0 0  Dressing or bathing? 0 0  Doing errands, shopping? 0 0  Preparing Food and eating ? N N  Using the Toilet? N N  In the past six months, have you accidently leaked urine? N N  Do you have problems with loss of bowel control? N N  Managing your Medications? N N  Managing your Finances? N N  Housekeeping or managing your Housekeeping? N N    Patient Care Team: Mosie Lukes, MD as PCP - General (Family Medicine) [place], Jennefer Bravo, MD as Referring Physician (Dermatology) Cherre Robins, RPH-CPP (Pharmacist)  Indicate any recent Medical Services you may have received from other than [facility] providers in the past year (date may be approximate).     Assessment:   This is a routine wellness examination for Rhesa.  Hearing/Vision screen Hearing Screening - Comments:: Bilateral hearing aids Vision Screening - Comments:: Last eye exam-02/19/2021-Dr. Eulas Post  Dietary issues and exercise activities discussed: Current Exercise Habits: The patient does not participate in regular exercise at present, Exercise limited by: None identified   Goals Addressed               This Visit's Progress     Patient Stated     Maintain current healthy lifestyle and independence (pt-stated)   On track      Depression Screen    03/30/2022    1:44 PM 01/06/2022    3:39 PM 03/24/2021    9:14 AM 03/12/2020    9:48 AM 06/26/2019   10:23 AM 09/28/2017    9:30 AM 01/09/2016   10:14 AM  PHQ 2/9  Scores  PHQ - 2 Score 0 0 0 0 0 0 0  PHQ- 9 Score     3      Fall Risk    03/30/2022    1:40 PM 03/24/2022    3:33 PM 01/06/2022    3:39 PM 12/02/2021   11:12 AM 06/03/2021    9:31 AM   Fall Risk   Falls in the past year? 0 0 0 0 0  Number falls in past yr: 0 0 0 0 0  Injury with Fall? 0  0 0 0  Risk for fall due to :   No Fall Risks    Follow up Falls prevention discussed  Falls evaluation completed      FALL RISK PREVENTION PERTAINING TO THE HOME:  Any stairs in or around the home? Yes  If so, are there any without handrails? No  Home free of loose throw rugs in walkways, pet beds, electrical cords, etc? Yes  Adequate lighting in your home to reduce risk of falls? Yes   ASSISTIVE DEVICES UTILIZED TO PREVENT FALLS:  Life alert? No  Use of a cane, walker or w/c? No  Grab bars in the bathroom? Yes  Shower chair or bench in shower? Yes  Elevated toilet seat or a handicapped toilet? No   TIMED UP AND GO:  Was the test performed? No . Phone visit   Cognitive Function:observation. Patient has current diagnosis of mild cognitive impairment. Patient has seen neurology recently.      12/02/2021   12:00 PM 04/24/2021   12:00 PM 09/28/2017    9:30 AM  MMSE - Mini Mental State Exam  Orientation to time '5 5 5  '$ Orientation to Place '5 5 5  '$ Registration '3 3 3  '$ Attention/ Calculation '5 2 5  '$ Recall '2 2 1  '$ Language- name 2 objects '2 2 2  '$ Language- repeat '1 1 1  '$ Language- follow 3 step command '3 3 3  '$ Language- read & follow direction '1 1 1  '$ Write a sentence '1 1 1  '$ Copy design '1 1 1  '$ Total score '29 26 28        '$ 03/24/2021    9:24 AM 03/12/2020    9:48 AM  6CIT Screen  What Year? 0 points 0 points  What month? 0 points 0 points  What time? 3 points 0 points  Count back from 20 0 points 0 points  Months in reverse 0 points 0 points  Repeat phrase 0 points 0 points  Total Score 3 points 0 points    Immunizations Immunization History  Administered Date(s) Administered   Fluad Quad(high Dose 65+) 06/15/2019   Influenza Split 07/10/2011, 07/05/2012   Influenza Whole 07/15/2010   Influenza, High Dose Seasonal PF 06/29/2016, 06/29/2017, 06/13/2018, 10/18/2020    Influenza,inj,Quad PF,6+ Mos 06/27/2013, 07/04/2014, 07/11/2015   Moderna Sars-Covid-2 Vaccination 11/09/2019, 12/07/2019, 07/25/2020   Pneumococcal Conjugate-13 07/14/2008   Pneumococcal Polysaccharide-23 07/11/2015   Td 09/15/2007   Tdap 06/22/2018   Zoster, Live 09/14/2006    TDAP status: Up to date  Flu Vaccine status: Up to date  Pneumococcal vaccine status: Up to date  Covid-19 vaccine status: Information provided on how to obtain vaccines.   Qualifies for Shingles Vaccine? Yes   Zostavax completed Yes   Shingrix Completed?: No.    Education has been provided regarding the importance of this vaccine. Patient has been advised to call insurance company to determine out of pocket expense if they have not yet received  this vaccine. Advised may also receive vaccine at local pharmacy or Health Dept. Verbalized acceptance and understanding.  Screening Tests Health Maintenance  Topic Date Due   Zoster Vaccines- Shingrix (1 of 2) Never done   MAMMOGRAM  09/10/2020   COVID-19 Vaccine (4 - Moderna series) 09/19/2020   INFLUENZA VACCINE  04/14/2022   TETANUS/TDAP  06/22/2028   Pneumonia Vaccine 23+ Years old  Completed   DEXA SCAN  Completed   HPV VACCINES  Aged Out    Health Maintenance  Health Maintenance Due  Topic Date Due   Zoster Vaccines- Shingrix (1 of 2) Never done   MAMMOGRAM  09/10/2020   COVID-19 Vaccine (4 - Moderna series) 09/19/2020    Colorectal cancer screening: No longer required.   Mammogram status: Ordered today. Pt provided with contact info and advised to call to schedule appt.   Bone density status: Declined  Lung Cancer Screening: (Low Dose CT Chest recommended if Age 28-80 years, 30 pack-year currently smoking OR have quit w/in 15years.) does not qualify.     Additional Screening:  Hepatitis C Screening: does not qualify  Vision Screening: Recommended annual ophthalmology exams for early detection of glaucoma and other disorders of the  eye. Is the patient up to date with their annual eye exam?  Yes  Who is the provider or what is the name of the office in which the patient attends annual eye exams? Dr. Eulas Post   Dental Screening: Recommended annual dental exams for proper oral hygiene  Community Resource Referral / Chronic Care Management: CRR required this visit?  No   CCM required this visit?  No      Plan:     I have personally reviewed and noted the following in the patient's chart:   Medical and social history Use of alcohol, tobacco or illicit drugs  Current medications and supplements including opioid prescriptions.  Functional ability and status Nutritional status Physical activity Advanced directives List of other physicians Hospitalizations, surgeries, and ER visits in previous 12 months Vitals Screenings to include cognitive, depression, and falls Referrals and appointments  In addition, I have reviewed and discussed with patient certain preventive protocols, quality metrics, and best practice recommendations. A written personalized care plan for preventive services as well as general preventive health recommendations were provided to patient.   Due to this being a telephonic visit, the after visit summary with patients personalized plan was offered to patient via mail or my-chart. Patient would like to access on my-chart.   Marta Antu, LPN   11/12/6008  Nurse Health Advisor  Nurse Notes: None

## 2022-04-01 ENCOUNTER — Telehealth (HOSPITAL_BASED_OUTPATIENT_CLINIC_OR_DEPARTMENT_OTHER): Payer: Self-pay

## 2022-04-02 ENCOUNTER — Telehealth (HOSPITAL_BASED_OUTPATIENT_CLINIC_OR_DEPARTMENT_OTHER): Payer: Self-pay

## 2022-04-03 DIAGNOSIS — L820 Inflamed seborrheic keratosis: Secondary | ICD-10-CM | POA: Diagnosis not present

## 2022-04-03 DIAGNOSIS — D485 Neoplasm of uncertain behavior of skin: Secondary | ICD-10-CM | POA: Diagnosis not present

## 2022-04-07 DIAGNOSIS — L2089 Other atopic dermatitis: Secondary | ICD-10-CM | POA: Diagnosis not present

## 2022-04-28 DIAGNOSIS — D1801 Hemangioma of skin and subcutaneous tissue: Secondary | ICD-10-CM | POA: Diagnosis not present

## 2022-04-28 DIAGNOSIS — L821 Other seborrheic keratosis: Secondary | ICD-10-CM | POA: Diagnosis not present

## 2022-04-28 DIAGNOSIS — D225 Melanocytic nevi of trunk: Secondary | ICD-10-CM | POA: Diagnosis not present

## 2022-04-28 DIAGNOSIS — L602 Onychogryphosis: Secondary | ICD-10-CM | POA: Diagnosis not present

## 2022-04-28 DIAGNOSIS — D2262 Melanocytic nevi of left upper limb, including shoulder: Secondary | ICD-10-CM | POA: Diagnosis not present

## 2022-04-28 DIAGNOSIS — D2261 Melanocytic nevi of right upper limb, including shoulder: Secondary | ICD-10-CM | POA: Diagnosis not present

## 2022-04-28 DIAGNOSIS — L570 Actinic keratosis: Secondary | ICD-10-CM | POA: Diagnosis not present

## 2022-04-28 DIAGNOSIS — L2089 Other atopic dermatitis: Secondary | ICD-10-CM | POA: Diagnosis not present

## 2022-05-07 ENCOUNTER — Ambulatory Visit: Payer: Medicare PPO | Admitting: Family Medicine

## 2022-05-07 NOTE — Progress Notes (Shared)
Subjective:   By signing my name below, I, Molly Williams, attest that this documentation has been prepared under the direction and in the presence of Mosie Lukes, MD 05/07/2022.   Patient ID: Molly Williams, female    DOB: May 04, 1931, 88 y.o.   MRN: [id number]  No chief complaint on file.  HPI Patient is in today for an office visit.   Past Medical History:  Diagnosis Date   Anemia 03/29/2015   Arthritis    Atrophic vaginitis    Cholelithiasis 2008   Fibrocystic breast disease    bx 2008 neg per pt report   GERD (gastroesophageal reflux disease)    Hearing loss    Hearing loss sensory, bilateral    bil/hearing aids   Hematuria    chronic Dr. Burnell Blanks   Hyperlipidemia 11/17/2016   Lupus (systemic lupus erythematosus) ([place])    Dr. Charlestine Night   Macular degeneration 11/17/2016   Migraine aura without headache    h/o ocular migraines   Osteopenia    Preventative health care 11/18/2016   Renal insufficiency 03/29/2015   Retinal detachment with break    right/laser   Retinal tear of right eye 11/17/2016   Skin lesion of face 01/09/2016   Spinal stenosis    Thyroid disease    hypothyroidism   Urinary tract bacterial infections    has had urethral dilations in past   Vitamin B12 deficiency 01/09/2016   Vitamin D deficiency 07/11/2015   Past Surgical History:  Procedure Laterality Date   APPENDECTOMY  2006   CATARACT EXTRACTION W/ INTRAOCULAR LENS  IMPLANT, BILATERAL     CHOLECYSTECTOMY     EYE SURGERY Bilateral    cataracts   TONSILLECTOMY  1940   VAGINAL HYSTERECTOMY     partial   Family History  Problem Relation Age of Onset   Stroke Paternal Grandmother    Stroke Father    Coronary artery disease Father    Coronary artery disease Mother    Stroke Mother    Cancer Paternal Aunt        GYN cancer   Cancer Paternal Uncle        lung, smoker   Stroke Maternal Grandmother    Stroke Maternal Grandfather    Heart disease Paternal Grandfather    Hydrocephalus  Brother        normopressure hydrocephalus, dementia   Social History   Socioeconomic History   Marital status: Widowed    Spouse name: Not on file   Number of children: Not on file   Years of education: 13   Highest education level: Not on file  Occupational History   Not on file  Tobacco Use   Smoking status: Former    Types: Cigarettes    Quit date: 1970    Years since quitting: 53.6   Smokeless tobacco: Never  Substance and Sexual Activity   Alcohol use: Yes    Alcohol/week: 10.0 standard drinks of alcohol    Types: 10 Glasses of wine per week   Drug use: No   Sexual activity: Not Currently    Comment: lives alone here because her daughter lives hier, no dietary restrictions, avoids dairy  Other Topics Concern   Not on file  Social History Narrative   Right hand   Caffeine yes   One story home   Social Determinants of Health   Financial Resource Strain: Low Risk  (03/30/2022)   Overall Financial Resource Strain (CARDIA)    Difficulty of Paying  Living Expenses: Not hard at all  Food Insecurity: No Food Insecurity (03/30/2022)   Hunger Vital Sign    Worried About Running Out of Food in the Last Year: Never true    Ran Out of Food in the Last Year: Never true  Transportation Needs: No Transportation Needs (03/30/2022)   PRAPARE - Hydrologist (Medical): No    Lack of Transportation (Non-Medical): No  Physical Activity: Inactive (03/30/2022)   Exercise Vital Sign    Days of Exercise per Week: 0 days    Minutes of Exercise per Session: 0 min  Stress: No Stress Concern Present (03/30/2022)   [place]    Feeling of Stress : Not at all  Social Connections: Moderately Integrated (03/30/2022)   Social Connection and Isolation Panel [NHANES]    Frequency of Communication with Friends and Family: More than three times a week    Frequency of Social Gatherings with Friends and  Family: More than three times a week    Attends Religious Services: More than 4 times per year    Active Member of [employer] or Organizations: Yes    Attends Archivist Meetings: More than 4 times per year    Marital Status: Widowed  Intimate Partner Violence: Not At Risk (03/30/2022)   Humiliation, Afraid, Rape, and Kick questionnaire    Fear of Current or Ex-Partner: No    Emotionally Abused: No    Physically Abused: No    Sexually Abused: No   Outpatient Medications Prior to Visit  Medication Sig Dispense Refill   acetaminophen (TYLENOL) 500 MG tablet Take 1,000 mg by mouth every 6 (six) hours as needed. pain     albuterol (VENTOLIN HFA) 108 (90 Base) MCG/ACT inhaler Inhale 2 puffs into the lungs every 6 (six) hours as needed for wheezing or shortness of breath. 18 g 3   aspirin-acetaminophen-caffeine (EXCEDRIN MIGRAINE) 623-762-83 MG tablet Take by mouth every 6 (six) hours as needed for headache.     Cholecalciferol (VITAMIN D) 2000 units CAPS Take 2,000 Units by mouth daily.     famotidine (PEPCID) 20 MG tablet Take 20 mg by mouth daily as needed for heartburn.      levothyroxine (SYNTHROID) 25 MCG tablet TAKE 1 TABLET BY MOUTH ONCE DAILY EXCEPT  TAKE  2  TABLETS  ON  TUESDAY  AND  SATURDAY. 60 tablet 5   omeprazole (PRILOSEC) 20 MG capsule Take 1 capsule (20 mg total) by mouth daily. 90 capsule 1   traZODone (DESYREL) 50 MG tablet TAKE 1/2 TO 1 (ONE-HALF TO ONE) TABLET BY MOUTH AT BEDTIME AS NEEDED FOR SLEEP 30 tablet 3   Turmeric 500 MG CAPS Take 1 capsule by mouth daily.     No facility-administered medications prior to visit.   Allergies  Allergen Reactions   Bee Venom Anaphylaxis   Codeine Nausea And Vomiting and Nausea Only   Iodine Hives    Contrast dye   Alendronate    Bacitracin Swelling    Ophthalmic ointment   Iodinated Contrast Media    Bactrim  [Sulfamethoxazole-Trimethoprim] Hives, Itching and Rash   ROS     Objective:    Physical  Exam Constitutional:      General: She is not in acute distress.    Appearance: Normal appearance. She is not ill-appearing.  HENT:     Head: Normocephalic and atraumatic.     Right Ear: External ear normal.  Left Ear: External ear normal.     Mouth/Throat:     Mouth: Mucous membranes are moist.     Pharynx: Oropharynx is clear.  Eyes:     Extraocular Movements: Extraocular movements intact.     Pupils: Pupils are equal, round, and reactive to light.  Cardiovascular:     Rate and Rhythm: Normal rate and regular rhythm.     Pulses: Normal pulses.     Heart sounds: Normal heart sounds. No murmur heard.    No gallop.  Pulmonary:     Effort: Pulmonary effort is normal. No respiratory distress.     Breath sounds: Normal breath sounds. No wheezing or rales.  Abdominal:     General: Bowel sounds are normal.  Skin:    General: Skin is warm and dry.  Neurological:     Mental Status: She is alert and oriented to person, place, and time.  Psychiatric:        Mood and Affect: Mood normal.        Behavior: Behavior normal.        Judgment: Judgment normal.    There were no vitals taken for this visit. Wt Readings from Last 3 Encounters:  03/30/22 117 lb (53.1 kg)  01/06/22 117 lb 12.8 oz (53.4 kg)  12/02/21 118 lb (53.5 kg)   Diabetic Foot Exam - Simple   No data filed    Lab Results  Component Value Date   WBC 6.1 09/29/2021   HGB 12.7 09/29/2021   HCT 38.4 09/29/2021   PLT 303.0 09/29/2021   GLUCOSE 95 10/06/2021   CHOL 228 (H) 09/29/2021   TRIG 104.0 09/29/2021   HDL 106.10 09/29/2021   LDLCALC 101 (H) 09/29/2021   ALT 13 10/06/2021   AST 17 10/06/2021   NA 135 10/06/2021   K 4.4 10/06/2021   CL 100 10/06/2021   CREATININE 1.01 10/06/2021   BUN 20 10/06/2021   CO2 26 10/06/2021   TSH 4.38 09/29/2021   INR 0.94 09/22/2014   HGBA1C 5.7 09/29/2021   Lab Results  Component Value Date   TSH 4.38 09/29/2021   Lab Results  Component Value Date   WBC 6.1  09/29/2021   HGB 12.7 09/29/2021   HCT 38.4 09/29/2021   MCV 97.8 09/29/2021   PLT 303.0 09/29/2021   Lab Results  Component Value Date   NA 135 10/06/2021   K 4.4 10/06/2021   CO2 26 10/06/2021   GLUCOSE 95 10/06/2021   BUN 20 10/06/2021   CREATININE 1.01 10/06/2021   BILITOT 0.4 10/06/2021   ALKPHOS 77 10/06/2021   AST 17 10/06/2021   ALT 13 10/06/2021   PROT 7.2 10/06/2021   ALBUMIN 4.2 10/06/2021   CALCIUM 9.3 10/06/2021   ANIONGAP 8 09/22/2014   GFR 49.68 (L) 10/06/2021   Lab Results  Component Value Date   CHOL 228 (H) 09/29/2021   Lab Results  Component Value Date   HDL 106.10 09/29/2021   Lab Results  Component Value Date   LDLCALC 101 (H) 09/29/2021   Lab Results  Component Value Date   TRIG 104.0 09/29/2021   Lab Results  Component Value Date   CHOLHDL 2 09/29/2021   Lab Results  Component Value Date   HGBA1C 5.7 09/29/2021      Assessment & Plan:   Problem List Items Addressed This Visit   None  No orders of the defined types were placed in this encounter.  I, Molly Williams, personally preformed the services  described in this documentation.  All medical record entries made by the scribe were at my direction and in my presence.  I have reviewed the chart and discharge instructions (if applicable) and agree that the record reflects my personal performance and is accurate and complete. 05/07/2022  I,Mohammed Iqbal,acting as a scribe for Penni Homans, MD.,have documented all relevant documentation on the behalf of Penni Homans, MD,as directed by  Penni Homans, MD while in the presence of Penni Homans, MD.  Molly Williams

## 2022-05-27 ENCOUNTER — Other Ambulatory Visit: Payer: Self-pay

## 2022-05-27 ENCOUNTER — Telehealth: Payer: Self-pay | Admitting: Family

## 2022-05-27 ENCOUNTER — Encounter: Payer: Self-pay | Admitting: Family

## 2022-05-27 ENCOUNTER — Ambulatory Visit (INDEPENDENT_AMBULATORY_CARE_PROVIDER_SITE_OTHER): Payer: Medicare PPO | Admitting: Family

## 2022-05-27 VITALS — BP 140/70 | HR 62 | Temp 98.9°F | Resp 18 | Ht 62.0 in | Wt 117.2 lb

## 2022-05-27 DIAGNOSIS — K1379 Other lesions of oral mucosa: Secondary | ICD-10-CM | POA: Diagnosis not present

## 2022-05-27 DIAGNOSIS — E538 Deficiency of other specified B group vitamins: Secondary | ICD-10-CM | POA: Diagnosis not present

## 2022-05-27 LAB — B12 AND FOLATE PANEL
Folate: 13.3 ng/mL (ref >5.9)
Vitamin B-12: 293 pg/mL (ref 211–911)

## 2022-05-27 MED ORDER — B-12 1000 MCG PO CAPS: 1.0000 | ORAL_CAPSULE | Freq: Every day | ORAL | Status: DC

## 2022-05-27 MED ORDER — LIDOCAINE VISCOUS HCL 2 % MT SOLN: 10.0000 mL | Freq: Four times a day (QID) | OROMUCOSAL | 0 refills | Status: DC | PRN

## 2022-05-27 NOTE — Telephone Encounter (Signed)
Please advise pt that her b12 is low normal. I would like her to add b12 1067mg caps once daily (available OTC).  Repeat b12 level in 1 month, dx b12 deficiency. Let me know if mouth pain does not improve in the next few weeks.

## 2022-05-27 NOTE — Assessment & Plan Note (Signed)
Not currently taking b12 supplement. Check follow up level.

## 2022-05-27 NOTE — Patient Instructions (Signed)
Please complete lab work prior to leaving. You may use magic mouth was shish and spit 4 times daily as needed. Call if symptoms worsen or if not improved in 1 week.

## 2022-05-27 NOTE — Progress Notes (Signed)
Subjective:   By signing my name below, I, Shehryar Baig, attest that this documentation has been prepared under the direction and in the presence of Debbrah Alar, NP 05/27/2022    Patient ID: Molly Williams, female    DOB: [birth year], 88 y.o.   MRN: [id number]  Chief Complaint  Patient presents with   Mouth concern    Pt states her niece came over back in August and she thinks she was exposed to something. Pt states having some dry mouth and some pain. Pt states also states lips are red.    HPI Patient is in today for a office visit.   She complains of dry mouth and painful red sores for the past 2 weeks. She denies nasal congestion, cough, fevers. She reports meeting her niece 2 weeks ago who came back from vacation. She is currently taking OTC mouth sprays and lozenges to manage her symptoms.   Past Medical History:  Diagnosis Date   Anemia 03/29/2015   Arthritis    Atrophic vaginitis    Cholelithiasis 2008   Fibrocystic breast disease    bx 2008 neg per pt report   GERD (gastroesophageal reflux disease)    Hearing loss    Hearing loss sensory, bilateral    bil/hearing aids   Hematuria    chronic Dr. Burnell Blanks   Hyperlipidemia 11/17/2016   Lupus (systemic lupus erythematosus) ([place])    Dr. Charlestine Night   Macular degeneration 11/17/2016   Migraine aura without headache    h/o ocular migraines   Osteopenia    Preventative health care 11/18/2016   Renal insufficiency 03/29/2015   Retinal detachment with break    right/laser   Retinal tear of right eye 11/17/2016   Skin lesion of face 01/09/2016   Spinal stenosis    Thyroid disease    hypothyroidism   Urinary tract bacterial infections    has had urethral dilations in past   Vitamin B12 deficiency 01/09/2016   Vitamin D deficiency 07/11/2015    Past Surgical History:  Procedure Laterality Date   APPENDECTOMY  2006   CATARACT EXTRACTION W/ INTRAOCULAR LENS  IMPLANT, BILATERAL     CHOLECYSTECTOMY     EYE SURGERY  Bilateral    cataracts   TONSILLECTOMY  1940   VAGINAL HYSTERECTOMY     partial    Family History  Problem Relation Age of Onset   Stroke Paternal Grandmother    Stroke Father    Coronary artery disease Father    Coronary artery disease Mother    Stroke Mother    Cancer Paternal Aunt        GYN cancer   Cancer Paternal Uncle        lung, smoker   Stroke Maternal Grandmother    Stroke Maternal Grandfather    Heart disease Paternal Grandfather    Hydrocephalus Brother        normopressure hydrocephalus, dementia    Social History   Socioeconomic History   Marital status: Widowed    Spouse name: Not on file   Number of children: Not on file   Years of education: 13   Highest education level: Not on file  Occupational History   Not on file  Tobacco Use   Smoking status: Former    Types: Cigarettes    Quit date: 1970    Years since quitting: 53.7   Smokeless tobacco: Never  Substance and Sexual Activity   Alcohol use: Yes    Alcohol/week: 10.0 standard drinks  of alcohol    Types: 10 Glasses of wine per week   Drug use: No   Sexual activity: Not Currently    Comment: lives alone here because her daughter lives hier, no dietary restrictions, avoids dairy  Other Topics Concern   Not on file  Social History Narrative   Right hand   Caffeine yes   One story home   Social Determinants of Health   Financial Resource Strain: Low Risk  (03/30/2022)   Overall Financial Resource Strain (CARDIA)    Difficulty of Paying Living Expenses: Not hard at all  Food Insecurity: No Food Insecurity (03/30/2022)   Hunger Vital Sign    Worried About Running Out of Food in the Last Year: Never true    Ran Out of Food in the Last Year: Never true  Transportation Needs: No Transportation Needs (03/30/2022)   PRAPARE - Hydrologist (Medical): No    Lack of Transportation (Non-Medical): No  Physical Activity: Inactive (03/30/2022)   Exercise Vital Sign     Days of Exercise per Week: 0 days    Minutes of Exercise per Session: 0 min  Stress: No Stress Concern Present (03/30/2022)   [place]    Feeling of Stress : Not at all  Social Connections: Moderately Integrated (03/30/2022)   Social Connection and Isolation Panel [NHANES]    Frequency of Communication with Friends and Family: More than three times a week    Frequency of Social Gatherings with Friends and Family: More than three times a week    Attends Religious Services: More than 4 times per year    Active Member of [employer] or Organizations: Yes    Attends Archivist Meetings: More than 4 times per year    Marital Status: Widowed  Intimate Partner Violence: Not At Risk (03/30/2022)   Humiliation, Afraid, Rape, and Kick questionnaire    Fear of Current or Ex-Partner: No    Emotionally Abused: No    Physically Abused: No    Sexually Abused: No    Outpatient Medications Prior to Visit  Medication Sig Dispense Refill   acetaminophen (TYLENOL) 500 MG tablet Take 1,000 mg by mouth every 6 (six) hours as needed. pain     albuterol (VENTOLIN HFA) 108 (90 Base) MCG/ACT inhaler Inhale 2 puffs into the lungs every 6 (six) hours as needed for wheezing or shortness of breath. 18 g 3   aspirin-acetaminophen-caffeine (EXCEDRIN MIGRAINE) 384-665-99 MG tablet Take by mouth every 6 (six) hours as needed for headache.     Cholecalciferol (VITAMIN D) 2000 units CAPS Take 2,000 Units by mouth daily.     famotidine (PEPCID) 20 MG tablet Take 20 mg by mouth daily as needed for heartburn.      levothyroxine (SYNTHROID) 25 MCG tablet TAKE 1 TABLET BY MOUTH ONCE DAILY EXCEPT  TAKE  2  TABLETS  ON  TUESDAY  AND  SATURDAY. 60 tablet 5   omeprazole (PRILOSEC) 20 MG capsule Take 1 capsule (20 mg total) by mouth daily. 90 capsule 1   traZODone (DESYREL) 50 MG tablet TAKE 1/2 TO 1 (ONE-HALF TO ONE) TABLET BY MOUTH AT BEDTIME AS NEEDED FOR SLEEP  30 tablet 3   Turmeric 500 MG CAPS Take 1 capsule by mouth daily.     No facility-administered medications prior to visit.    Allergies  Allergen Reactions   Bee Venom Anaphylaxis   Codeine Nausea And Vomiting and  Nausea Only   Iodine Hives    Contrast dye   Alendronate    Bacitracin Swelling    Ophthalmic ointment   Iodinated Contrast Media    Bactrim  [Sulfamethoxazole-Trimethoprim] Hives, Itching and Rash    Review of Systems  Constitutional:  Negative for fever.  HENT:  Negative for congestion.        (+)painful red sores in mouth (+)dry mouth  Respiratory:  Negative for cough.        Objective:    Physical Exam Constitutional:      General: She is not in acute distress.    Appearance: Normal appearance. She is not ill-appearing.  HENT:     Head: Normocephalic and atraumatic.     Comments: Normal mouth exam, tongue looks normal    Right Ear: Tympanic membrane, ear canal and external ear normal.     Left Ear: Tympanic membrane, ear canal and external ear normal.     Nose: No congestion.     Mouth/Throat:     Pharynx: Oropharynx is clear. No oropharyngeal exudate or posterior oropharyngeal erythema.  Eyes:     Extraocular Movements: Extraocular movements intact.     Pupils: Pupils are equal, round, and reactive to light.  Cardiovascular:     Rate and Rhythm: Normal rate and regular rhythm.     Heart sounds: Normal heart sounds. No murmur heard.    No gallop.  Pulmonary:     Effort: Pulmonary effort is normal. No respiratory distress.     Breath sounds: Normal breath sounds. No wheezing or rales.  Lymphadenopathy:     Cervical: No cervical adenopathy.  Skin:    General: Skin is warm and dry.  Neurological:     Mental Status: She is alert and oriented to person, place, and time.  Psychiatric:        Judgment: Judgment normal.     BP (!) 140/70 (BP Location: Left Arm, Patient Position: Sitting, Cuff Size: Normal)   Pulse 62   Temp 98.9 F (37.2 C)  (Oral)   Resp 18   Ht '5\' 2"'$  (1.575 m)   Wt 117 lb 3.2 oz (53.2 kg)   SpO2 98%   BMI 21.44 kg/m  Wt Readings from Last 3 Encounters:  05/27/22 117 lb 3.2 oz (53.2 kg)  03/30/22 117 lb (53.1 kg)  01/06/22 117 lb 12.8 oz (53.4 kg)      Assessment & Plan:   Problem List Items Addressed This Visit       Unprioritized   Mouth pain    I suspect that this may be due to b12 deficiency.  Will check b12 level and folate level.  In the meantime, rx sent for magic mouthwash. Pt is advised to call if symptoms worsen or if not improved in 1 week.       B12 deficiency - Primary    Not currently taking b12 supplement. Check follow up level.       Relevant Orders   B12 and Folate Panel     Meds ordered this encounter  Medications   magic mouthwash (lidocaine, diphenhydrAMINE, alum & mag hydroxide) suspension    Sig: Swish and spit 10 mLs 4 (four) times daily as needed for mouth pain. Ration for meds is 1:1:1:1 please.    Dispense:  360 mL    Refill:  0    Order Specific Question:   Supervising Provider    Answer:   Penni Homans A [[id number]]    I, Franki Stemen  Rowe Pavy, NP, personally preformed the services described in this documentation.  All medical record entries made by the scribe were at my direction and in my presence.  I have reviewed the chart and discharge instructions (if applicable) and agree that the record reflects my personal performance and is accurate and complete. 05/27/2022   I,Shehryar Baig,acting as a Education administrator for Nance Pear, NP.,have documented all relevant documentation on the behalf of Nance Pear, NP,as directed by  Nance Pear, NP while in the presence of Nance Pear, NP.   Nance Pear, NP

## 2022-05-27 NOTE — Telephone Encounter (Signed)
Patient advised of results, to re-start B12 and scheduled to come in 06-26-22 for labs. Order entered as future

## 2022-05-27 NOTE — Assessment & Plan Note (Signed)
I suspect that this may be due to b12 deficiency.  Will check b12 level and folate level.  In the meantime, rx sent for magic mouthwash. Pt is advised to call if symptoms worsen or if not improved in 1 week.

## 2022-06-02 ENCOUNTER — Other Ambulatory Visit: Payer: Self-pay

## 2022-06-02 ENCOUNTER — Telehealth: Payer: Self-pay | Admitting: Family Medicine

## 2022-06-02 MED ORDER — OMEPRAZOLE 20 MG PO CPDR: 20.0000 mg | DELAYED_RELEASE_CAPSULE | Freq: Every day | ORAL | 1 refills | Status: DC

## 2022-06-02 NOTE — Telephone Encounter (Signed)
Medication: omeprazole (PRILOSEC) 20 MG capsule [[id number]]   Has the patient contacted their pharmacy? Yes.   Patient transferred from [facility] to [facility] and the pharmacist said she would have to call to get new rx since it's the first time they are filling it.   Preferred Pharmacy (with phone number or street name): [facility] [address] [place], [place], [place] [address], [phone]  Agent: Please be advised that RX refills may take up to 3 business days. We ask that you follow-up with your pharmacy.

## 2022-06-26 ENCOUNTER — Other Ambulatory Visit (INDEPENDENT_AMBULATORY_CARE_PROVIDER_SITE_OTHER): Payer: Medicare PPO

## 2022-06-26 DIAGNOSIS — E538 Deficiency of other specified B group vitamins: Secondary | ICD-10-CM | POA: Diagnosis not present

## 2022-06-26 LAB — VITAMIN B12: Vitamin B-12: 348 pg/mL (ref 211–911)

## 2022-06-28 ENCOUNTER — Telehealth: Payer: Self-pay | Admitting: Family

## 2022-06-28 NOTE — Telephone Encounter (Signed)
B12 level has not come up much despite her taking the oral b12. I would recommend that she stop oral b12 and start b12 injections- 1073mg PO once weekly for 4 weeks the once monthly. Repeat b12 level in 12 weeks.  How is her mouth pain?

## 2022-06-29 NOTE — Telephone Encounter (Signed)
Patient advised of B12 results and she was scheduled to come in for first B12 shot tomorrow.  She reports her mouth is completely heal the dentist diagnosed her with dry mouth and is treating her for it.

## 2022-06-30 ENCOUNTER — Ambulatory Visit (INDEPENDENT_AMBULATORY_CARE_PROVIDER_SITE_OTHER): Payer: Medicare PPO

## 2022-06-30 DIAGNOSIS — E538 Deficiency of other specified B group vitamins: Secondary | ICD-10-CM | POA: Diagnosis not present

## 2022-06-30 MED ORDER — CYANOCOBALAMIN 1000 MCG/ML IJ SOLN
1000.0000 ug | Freq: Once | INTRAMUSCULAR | Status: AC
  Administered 2022-06-30: 1000 ug via INTRAMUSCULAR

## 2022-06-30 NOTE — Progress Notes (Signed)
Molly Williams is a 88 y.o. female resents to the office today for 1/4 weekly B12:injections, per physician's orders. Original order: 06/28/22:  "B12 level has not come up much despite her taking the oral b12. I would recommend that she stop oral b12 and start b12 injections- 1022mg PO once weekly for 4 weeks then once monthly. Repeat b12 level in 12 weeks." B12 10075m given IM, was administered L Deltoid today. Patient tolerated injection. Patient due for follow up labs/provider appt: No.  Patient next injection due: 1 week, appt made Yes  [place], Molly Williams

## 2022-07-06 NOTE — Progress Notes (Signed)
[name] is a 88 Molly.o. female resents to the office today for 2/4 weekly B12:injections, per physician's orders. Original order: 06/28/22:  "B12 level has not come up much despite her taking the oral b12. I would recommend that she stop oral b12 and start b12 injections- 1024mg PO once weekly for 4 weeks then once monthly. Repeat b12 level in 12 weeks." Cyanocobalamin, 1000 mg/ml IM was administered in L Deltoid today.Patient tolerated injection. Patient due for follow up labs/provider appt: No.  Patient next injection due: 1 week, appt made Yes  Molly Lowne- DOD  Molly Williams Molly Williams

## 2022-07-07 ENCOUNTER — Ambulatory Visit (INDEPENDENT_AMBULATORY_CARE_PROVIDER_SITE_OTHER): Payer: Medicare PPO

## 2022-07-07 DIAGNOSIS — E538 Deficiency of other specified B group vitamins: Secondary | ICD-10-CM | POA: Diagnosis not present

## 2022-07-07 MED ORDER — CYANOCOBALAMIN 1000 MCG/ML IJ SOLN
1000.0000 ug | Freq: Once | INTRAMUSCULAR | Status: AC
  Administered 2022-07-07: 1000 ug via INTRAMUSCULAR

## 2022-07-15 ENCOUNTER — Ambulatory Visit (INDEPENDENT_AMBULATORY_CARE_PROVIDER_SITE_OTHER): Payer: Medicare PPO

## 2022-07-15 DIAGNOSIS — E538 Deficiency of other specified B group vitamins: Secondary | ICD-10-CM

## 2022-07-15 MED ORDER — CYANOCOBALAMIN 1000 MCG/ML IJ SOLN
1000.0000 ug | Freq: Once | INTRAMUSCULAR | Status: AC
  Administered 2022-07-15: 1000 ug via INTRAMUSCULAR

## 2022-07-15 NOTE — Progress Notes (Signed)
Molly Williams is a 88 y.o. female resents to the office today for 3/4 weekly B12:injections, per physician's orders. Original order: 06/28/22: "B12 level has not come up much despite her taking the oral b12. I would recommend that she stop oral b12 and start b12 injections- 1046mg PO once weekly for 4 weeks then once monthly. Repeat b12 level in 12 weeks." Cyanocobalamin, 1000 mg/ml IM was administered in L Deltoid today.Patient tolerated injection. Patient due for follow up labs/provider appt: No

## 2022-07-22 ENCOUNTER — Ambulatory Visit (INDEPENDENT_AMBULATORY_CARE_PROVIDER_SITE_OTHER): Payer: Medicare PPO

## 2022-07-22 DIAGNOSIS — E538 Deficiency of other specified B group vitamins: Secondary | ICD-10-CM | POA: Diagnosis not present

## 2022-07-22 MED ORDER — CYANOCOBALAMIN 1000 MCG/ML IJ SOLN
1000.0000 ug | INTRAMUSCULAR | Status: AC
  Administered 2022-07-22: 1000 ug via INTRAMUSCULAR

## 2022-07-22 NOTE — Progress Notes (Signed)
Pt here for monthly B12 injection per provider orders  B12 1057mg given rigt IM, and pt tolerated injection well.  Next B12 injection scheduled for monthly injection in December

## 2022-08-26 ENCOUNTER — Ambulatory Visit (INDEPENDENT_AMBULATORY_CARE_PROVIDER_SITE_OTHER): Payer: Medicare PPO

## 2022-08-26 DIAGNOSIS — E538 Deficiency of other specified B group vitamins: Secondary | ICD-10-CM | POA: Diagnosis not present

## 2022-08-26 MED ORDER — CYANOCOBALAMIN 1000 MCG/ML IJ SOLN
1000.0000 ug | Freq: Once | INTRAMUSCULAR | Status: AC
  Administered 2022-08-26: 1000 ug via INTRAMUSCULAR

## 2022-08-26 NOTE — Progress Notes (Signed)
Pt here for monthly B12 injection per provider's orders  B12 1088mg given IM, and pt tolerated injection well.  Next B12 injection scheduled for 09/24/2022

## 2022-09-13 ENCOUNTER — Other Ambulatory Visit: Payer: Self-pay | Admitting: Family Medicine

## 2022-09-23 ENCOUNTER — Ambulatory Visit: Payer: Medicare Other

## 2022-09-24 ENCOUNTER — Ambulatory Visit: Payer: Medicare Other

## 2022-10-07 DIAGNOSIS — L820 Inflamed seborrheic keratosis: Secondary | ICD-10-CM | POA: Diagnosis not present

## 2022-10-23 ENCOUNTER — Other Ambulatory Visit: Payer: Self-pay | Admitting: Family Medicine

## 2022-11-29 ENCOUNTER — Other Ambulatory Visit: Payer: Self-pay | Admitting: Family Medicine

## 2022-11-29 DIAGNOSIS — E079 Disorder of thyroid, unspecified: Secondary | ICD-10-CM

## 2022-12-03 ENCOUNTER — Ambulatory Visit: Payer: Medicare PPO | Admitting: Physician Assistant

## 2022-12-03 ENCOUNTER — Encounter: Payer: Self-pay | Admitting: Physician Assistant

## 2022-12-12 ENCOUNTER — Emergency Department (HOSPITAL_BASED_OUTPATIENT_CLINIC_OR_DEPARTMENT_OTHER): Payer: Medicare Other

## 2022-12-12 ENCOUNTER — Encounter (HOSPITAL_BASED_OUTPATIENT_CLINIC_OR_DEPARTMENT_OTHER): Payer: Self-pay | Admitting: Emergency Medicine

## 2022-12-12 ENCOUNTER — Observation Stay (HOSPITAL_COMMUNITY): Payer: Medicare Other

## 2022-12-12 ENCOUNTER — Other Ambulatory Visit: Payer: Self-pay

## 2022-12-12 ENCOUNTER — Observation Stay (HOSPITAL_BASED_OUTPATIENT_CLINIC_OR_DEPARTMENT_OTHER)
Admission: EM | Admit: 2022-12-12 | Discharge: 2022-12-13 | Disposition: A | Discharge status: Home / Self Care | Payer: Medicare Other | Attending: Internal Medicine | Admitting: Internal Medicine

## 2022-12-12 DIAGNOSIS — N183 Chronic kidney disease, stage 3 unspecified: Secondary | ICD-10-CM | POA: Diagnosis not present

## 2022-12-12 DIAGNOSIS — Z87891 Personal history of nicotine dependence: Secondary | ICD-10-CM | POA: Insufficient documentation

## 2022-12-12 DIAGNOSIS — Z79899 Other long term (current) drug therapy: Secondary | ICD-10-CM | POA: Diagnosis not present

## 2022-12-12 DIAGNOSIS — K219 Gastro-esophageal reflux disease without esophagitis: Secondary | ICD-10-CM | POA: Diagnosis present

## 2022-12-12 DIAGNOSIS — I10 Essential (primary) hypertension: Secondary | ICD-10-CM | POA: Diagnosis present

## 2022-12-12 DIAGNOSIS — Z7982 Long term (current) use of aspirin: Secondary | ICD-10-CM | POA: Insufficient documentation

## 2022-12-12 DIAGNOSIS — G43909 Migraine, unspecified, not intractable, without status migrainosus: Secondary | ICD-10-CM | POA: Diagnosis present

## 2022-12-12 DIAGNOSIS — R1310 Dysphagia, unspecified: Secondary | ICD-10-CM

## 2022-12-12 DIAGNOSIS — I5032 Chronic diastolic (congestive) heart failure: Secondary | ICD-10-CM | POA: Diagnosis not present

## 2022-12-12 DIAGNOSIS — E039 Hypothyroidism, unspecified: Secondary | ICD-10-CM | POA: Diagnosis present

## 2022-12-12 DIAGNOSIS — G459 Transient cerebral ischemic attack, unspecified: Secondary | ICD-10-CM | POA: Diagnosis not present

## 2022-12-12 DIAGNOSIS — E538 Deficiency of other specified B group vitamins: Secondary | ICD-10-CM | POA: Diagnosis present

## 2022-12-12 DIAGNOSIS — I130 Hypertensive heart and chronic kidney disease with heart failure and stage 1 through stage 4 chronic kidney disease, or unspecified chronic kidney disease: Secondary | ICD-10-CM | POA: Diagnosis not present

## 2022-12-12 DIAGNOSIS — E559 Vitamin D deficiency, unspecified: Secondary | ICD-10-CM | POA: Diagnosis present

## 2022-12-12 DIAGNOSIS — E871 Hypo-osmolality and hyponatremia: Secondary | ICD-10-CM | POA: Diagnosis present

## 2022-12-12 DIAGNOSIS — G4700 Insomnia, unspecified: Secondary | ICD-10-CM | POA: Diagnosis present

## 2022-12-12 DIAGNOSIS — J45909 Unspecified asthma, uncomplicated: Secondary | ICD-10-CM | POA: Diagnosis present

## 2022-12-12 DIAGNOSIS — R4701 Aphasia: Secondary | ICD-10-CM | POA: Diagnosis present

## 2022-12-12 DIAGNOSIS — E785 Hyperlipidemia, unspecified: Secondary | ICD-10-CM | POA: Diagnosis present

## 2022-12-12 LAB — APTT: aPTT: 33 seconds (ref 24–36)

## 2022-12-12 LAB — URINALYSIS, ROUTINE W REFLEX MICROSCOPIC
Bilirubin Urine: NEGATIVE
Glucose, UA: NEGATIVE mg/dL
Ketones, ur: 15 mg/dL — AB
Leukocytes,Ua: NEGATIVE
Nitrite: NEGATIVE
Protein, ur: NEGATIVE mg/dL
Specific Gravity, Urine: 1.02 (ref 1.005–1.030)
pH: 6 (ref 5.0–8.0)

## 2022-12-12 LAB — COMPREHENSIVE METABOLIC PANEL
ALT: 16 U/L (ref 0–44)
AST: 23 U/L (ref 15–41)
Albumin: 3.9 g/dL (ref 3.5–5.0)
Alkaline Phosphatase: 73 U/L (ref 38–126)
Anion gap: 9 (ref 5–15)
BUN: 15 mg/dL (ref 8–23)
CO2: 22 mmol/L (ref 22–32)
Calcium: 8.6 mg/dL — ABNORMAL LOW (ref 8.9–10.3)
Chloride: 101 mmol/L (ref 98–111)
Creatinine, Ser: 0.99 mg/dL (ref 0.44–1.00)
GFR, Estimated: 55 mL/min — ABNORMAL LOW (ref >60)
Glucose, Bld: 90 mg/dL (ref 70–99)
Potassium: 3.7 mmol/L (ref 3.5–5.1)
Sodium: 132 mmol/L — ABNORMAL LOW (ref 135–145)
Total Bilirubin: 0.8 mg/dL (ref 0.3–1.2)
Total Protein: 7.3 g/dL (ref 6.5–8.1)

## 2022-12-12 LAB — PROTIME-INR
INR: 1.1 (ref 0.8–1.2)
Prothrombin Time: 13.6 seconds (ref 11.4–15.2)

## 2022-12-12 LAB — URINALYSIS, MICROSCOPIC (REFLEX)

## 2022-12-12 LAB — CBC
HCT: 36.9 % (ref 36.0–46.0)
Hemoglobin: 12.5 g/dL (ref 12.0–15.0)
MCH: 32.5 pg (ref 26.0–34.0)
MCHC: 33.9 g/dL (ref 30.0–36.0)
MCV: 95.8 fL (ref 80.0–100.0)
Platelets: 287 10*3/uL (ref 150–400)
RBC: 3.85 MIL/uL — ABNORMAL LOW (ref 3.87–5.11)
RDW: 13.1 % (ref 11.5–15.5)
WBC: 6.2 10*3/uL (ref 4.0–10.5)
nRBC: 0 % (ref 0.0–0.2)

## 2022-12-12 LAB — DIFFERENTIAL
Abs Immature Granulocytes: 0.01 10*3/uL (ref 0.00–0.07)
Basophils Absolute: 0 10*3/uL (ref 0.0–0.1)
Basophils Relative: 1 %
Eosinophils Absolute: 0.2 10*3/uL (ref 0.0–0.5)
Eosinophils Relative: 3 %
Immature Granulocytes: 0 %
Lymphocytes Relative: 16 %
Lymphs Abs: 1 10*3/uL (ref 0.7–4.0)
Monocytes Absolute: 0.6 10*3/uL (ref 0.1–1.0)
Monocytes Relative: 9 %
Neutro Abs: 4.4 10*3/uL (ref 1.7–7.7)
Neutrophils Relative %: 71 %

## 2022-12-12 LAB — LIPID PANEL
Cholesterol: 210 mg/dL — ABNORMAL HIGH (ref 0–200)
HDL: 100 mg/dL (ref >40)
LDL Cholesterol: 98 mg/dL (ref 0–99)
Total CHOL/HDL Ratio: 2.1 RATIO
Triglycerides: 62 mg/dL (ref <150)
VLDL: 12 mg/dL (ref 0–40)

## 2022-12-12 LAB — CBG MONITORING, ED: Glucose-Capillary: 84 mg/dL (ref 70–99)

## 2022-12-12 LAB — ETHANOL: Alcohol, Ethyl (B): <10 mg/dL (ref <10)

## 2022-12-12 LAB — TSH: TSH: 4.614 u[IU]/mL — ABNORMAL HIGH (ref 0.350–4.500)

## 2022-12-12 LAB — RAPID URINE DRUG SCREEN, HOSP PERFORMED
Amphetamines: NOT DETECTED
Barbiturates: NOT DETECTED
Benzodiazepines: NOT DETECTED
Cocaine: NOT DETECTED
Opiates: NOT DETECTED
Tetrahydrocannabinol: NOT DETECTED

## 2022-12-12 LAB — VITAMIN B12: Vitamin B-12: 387 pg/mL (ref 180–914)

## 2022-12-12 MED ORDER — VITAMIN D 50 MCG (2000 UT) PO CAPS
2000.0000 [IU] | ORAL_CAPSULE | Freq: Every day | ORAL | Status: DC
  Filled 2022-12-12: qty 1

## 2022-12-12 MED ORDER — LEVOTHYROXINE SODIUM 25 MCG PO TABS
25.0000 ug | ORAL_TABLET | Freq: Every day | ORAL | Status: DC
  Administered 2022-12-13: 25 ug via ORAL
  Filled 2022-12-12: qty 1

## 2022-12-12 MED ORDER — ACETAMINOPHEN 325 MG PO TABS: 650.0000 mg | ORAL_TABLET | ORAL | Status: DC | PRN

## 2022-12-12 MED ORDER — ATORVASTATIN CALCIUM 40 MG PO TABS
40.0000 mg | ORAL_TABLET | Freq: Every day | ORAL | Status: DC
  Administered 2022-12-13: 40 mg via ORAL
  Filled 2022-12-12: qty 1

## 2022-12-12 MED ORDER — TRAZODONE HCL 50 MG PO TABS
25.0000 mg | ORAL_TABLET | Freq: Every day | ORAL | Status: DC
  Administered 2022-12-12: 25 mg via ORAL
  Filled 2022-12-12: qty 1

## 2022-12-12 MED ORDER — STROKE: EARLY STAGES OF RECOVERY BOOK
Freq: Once | Status: AC
  Filled 2022-12-12: qty 1

## 2022-12-12 MED ORDER — ALBUTEROL SULFATE HFA 108 (90 BASE) MCG/ACT IN AERS: 2.0000 | INHALATION_SPRAY | Freq: Four times a day (QID) | RESPIRATORY_TRACT | Status: DC | PRN

## 2022-12-12 MED ORDER — ALBUTEROL SULFATE (2.5 MG/3ML) 0.083% IN NEBU: 2.5000 mg | INHALATION_SOLUTION | Freq: Four times a day (QID) | RESPIRATORY_TRACT | Status: DC | PRN

## 2022-12-12 MED ORDER — ASPIRIN 300 MG RE SUPP: 300.0000 mg | Freq: Every day | RECTAL | Status: DC

## 2022-12-12 MED ORDER — PANTOPRAZOLE SODIUM 40 MG PO TBEC
40.0000 mg | DELAYED_RELEASE_TABLET | Freq: Every day | ORAL | Status: DC
  Administered 2022-12-13: 40 mg via ORAL
  Filled 2022-12-12: qty 1

## 2022-12-12 MED ORDER — ENOXAPARIN SODIUM 30 MG/0.3ML IJ SOSY
30.0000 mg | PREFILLED_SYRINGE | INTRAMUSCULAR | Status: DC
  Administered 2022-12-13: 30 mg via SUBCUTANEOUS
  Filled 2022-12-12: qty 0.3

## 2022-12-12 MED ORDER — ASPIRIN 325 MG PO TABS
325.0000 mg | ORAL_TABLET | Freq: Every day | ORAL | Status: DC
  Administered 2022-12-12: 325 mg via ORAL
  Filled 2022-12-12: qty 1

## 2022-12-12 MED ORDER — SODIUM CHLORIDE 0.9% FLUSH
3.0000 mL | Freq: Once | INTRAVENOUS | Status: AC
  Administered 2022-12-12: 3 mL via INTRAVENOUS
  Filled 2022-12-12: qty 3

## 2022-12-12 MED ORDER — ACETAMINOPHEN 650 MG RE SUPP: 650.0000 mg | RECTAL | Status: DC | PRN

## 2022-12-12 MED ORDER — ACETAMINOPHEN 160 MG/5ML PO SOLN: 650.0000 mg | ORAL | Status: DC | PRN

## 2022-12-12 NOTE — ED Notes (Signed)
Called [facility] for transport at 3:26.

## 2022-12-12 NOTE — Assessment & Plan Note (Addendum)
Underwent full stroke workup.  Seen by neurology.  Placed on aspirin indefinitely plus Plavix x 3 weeks.  Also found to have elevated lipid panel.  She had briefly been on a statin many years ago, but had since stopped.  LDL at 98 and have restarted medication.  Cleared by speech and PT/OT.  Echocardiogram unremarkable.

## 2022-12-12 NOTE — Assessment & Plan Note (Signed)
Patient normally takes it probably cycle most every day.  Given the interaction with Plavix, advised patient to hold this for the next 3 weeks.

## 2022-12-12 NOTE — H&P (Addendum)
History and Physical    Patient: Molly Williams [id number] DOB: 03-Jan-1931 DOA: 12/12/2022 DOS: the patient was seen and examined on 12/12/2022 PCP: Mosie Lukes, MD  Patient coming from: Outside Hospital  Chief Complaint: Expressive Aphasia  HPI: This is a very pleasant 89 year old female with a history of hyperlipidemia migraine disorder macular degeneration and hypothyroidism in addition to questionable SLE who presents with a transient episode of expressive aphasia at approximately 11 AM this morning <10 minutes duration.  The patient denies any other neurological symptoms specifically she denies any sensory or motor loss she denies any dysphagia or facial asymmetry.  In regards to her SLE diagnosis she reports she was told by rheumatologist that this was a unlikely diagnosis previously given to her, no hx of unexplained thrombotic events.  She is a retired Surveyor, mining and her son is a retired family medicine physician her daughter [name] is her main caregiver who is local to [place].  The patient denies any recurrence of the symptoms but does report a history of diarrhea from the 27th to the 30th immediately following a visit to the dentist office however she denies any accompanying vomiting denies any abdominal pain denies any fever or chills.  She reports this diarrhea has resolved with no bowel movements today.  Patient does note episodes of short-term memory loss over the last 6 to 8 months.  The patient presented to [facility] after her friend noted the expressive aphasia and she called her daughter and was subsequently transferred to [facility] for TIA investigation.  At outside emergency department urinalysis notable for less than 10 WBCs although bacteria noted patient denies any symptoms.  Patient was noted to have mild hyponatremia at 132, coagulation studies are within normal limits  [facility] records reviewed and  confirmed the patient's HPI reviewed outpatient labs and see 5 months ago her B12 was noted at 348.  ECG from 103 today independently reviewed and interpreted has significant artifact although it sinus rhythm with ventricular rate of 77 QTc of 461 with no ST changes  Patient reports she would be like to be DO NOT RESUSCITATE, meaning she would not like CPR or intubation or defibrillation however she like all other measures and reports in the past she has filled out a living will.  I spoke to my neurology colleague at approximately 7:55 PM Dr. Bartholome Bill who will review the patient in consultation kindly.  Attempted to contact the patient's daughter did leave message that attempted just for routine update.  Review of Systems:   Constitutional: Denies Weight Loss, Fever, Chills or Night Sweats Eyes: Denies Blurry Vision, Eye Pain or Decreased Vision Respiratory: Denies Shortness of Breath, Cough, Hemoptysis, Wheezing, Pleurisy Cardiovascular: Denies Chest Pain, Paroxsymal Nocturnal Dyspnea, Palpitations, Edema Gastrointestinal: Denies Nausea, Vomiting, Reports Diarrhea but no Hematemesis, Melena Genitourinary: Denies hematuria, dysuria, hesitancy, incontinence Neurological: Denies Migraines, Numbness, Ataxia, Tremors, Vertigo but expresses episode of aphasia Endocrine: Denies Excess Thirst, Polyuria, Cold Intolerance, Heat Intolerance All other systems were reviewed and are negative   Past Medical History:  Diagnosis Date   Anemia 03/29/2015   Arthritis    Atrophic vaginitis    Cholelithiasis 2008   Fibrocystic breast disease    bx 2008 neg per pt report   GERD (gastroesophageal reflux disease)    Hearing loss    Hearing loss sensory, bilateral    bil/hearing aids   Hematuria    chronic Dr. Burnell Blanks   Hyperlipidemia 11/17/2016  Lupus (systemic lupus erythematosus) ([place])    Dr. Charlestine Night   Macular degeneration 11/17/2016   Migraine aura without headache    h/o ocular migraines    Osteopenia    Preventative health care 11/18/2016   Renal insufficiency 03/29/2015   Retinal detachment with break    right/laser   Retinal tear of right eye 11/17/2016   Skin lesion of face 01/09/2016   Spinal stenosis    Thyroid disease    hypothyroidism   Urinary tract bacterial infections    has had urethral dilations in past   Vitamin B12 deficiency 01/09/2016   Vitamin D deficiency 07/11/2015   Past Surgical History:  Procedure Laterality Date   APPENDECTOMY  2006   CATARACT EXTRACTION W/ INTRAOCULAR LENS  IMPLANT, BILATERAL     CHOLECYSTECTOMY     EYE SURGERY Bilateral    cataracts   TONSILLECTOMY  1940   VAGINAL HYSTERECTOMY     partial   Social History:  reports that she quit smoking about 54 years ago. Her smoking use included cigarettes. She has never used smokeless tobacco. She reports current alcohol use of about 10.0 standard drinks of alcohol per week. She reports that she does not use drugs.  Allergies  Allergen Reactions   Bee Venom Anaphylaxis   Codeine Nausea And Vomiting and Nausea Only   Iodine Hives    Contrast dye   Alendronate    Bacitracin Swelling    Ophthalmic ointment   Iodinated Contrast Media    Bactrim  [Sulfamethoxazole-Trimethoprim] Hives, Itching and Rash    Family History  Problem Relation Age of Onset   Stroke Paternal Grandmother    Stroke Father    Coronary artery disease Father    Coronary artery disease Mother    Stroke Mother    Cancer Paternal Aunt        GYN cancer   Cancer Paternal Uncle        lung, smoker   Stroke Maternal Grandmother    Stroke Maternal Grandfather    Heart disease Paternal Grandfather    Hydrocephalus Brother        normopressure hydrocephalus, dementia    Prior to Admission medications   Medication Sig Start Date End Date Taking? Authorizing Provider  albuterol (VENTOLIN HFA) 108 (90 Base) MCG/ACT inhaler Inhale 2 puffs into the lungs every 6 (six) hours as needed for wheezing or shortness of  breath. 12/09/20   Mosie Lukes, MD  Cholecalciferol (VITAMIN D) 2000 units CAPS Take 2,000 Units by mouth daily.    [provider]  levothyroxine (SYNTHROID) 25 MCG tablet TAKE ONE TABLET BY MOUTH ONE TIME DAILY EXCEPT ON TUESDAYS AND SATURDAYS TAKE 2 TABS 11/30/22   Mosie Lukes, MD  omeprazole (PRILOSEC) 20 MG capsule Take 1 capsule (20 mg total) by mouth daily. 10/23/22   Mosie Lukes, MD  traZODone (DESYREL) 50 MG tablet TAKE 1/2 TO 1 (ONE-HALF TO ONE) TABLET BY MOUTH AT BEDTIME AS NEEDED FOR SLEEP 09/15/22   Mosie Lukes, MD    Physical Exam: Patient seen and examined approximately 7:15 PM on the floor 3 W. room 1 Vitals:   12/12/22 1600 12/12/22 1603 12/12/22 1659 12/12/22 1947  BP: (!) 167/67  (!) 156/65 (!) 150/112  Pulse: 76  69 80  Resp: (!) 23   17  Temp:  98.4 F (36.9 C) 98.6 F (37 C) 98.4 F (36.9 C)  TempSrc:  Oral Oral Oral  SpO2: 100%  99% 95%  Weight:  54.3 kg   Height:   5\' 2"  (1.575 m)    Constitutional:  Vital Signs as per Above [facility] than three noted] No Acute Distress Eyes:  Pink Conjunctiva and no Ptosis ENMT:   External Appearance of Ears and Nose without obvious deformity, masses or scar             Examination of teeth lips and gums: Neck:     Trachea Midline, Neck Symmetric             Thyroid without tenderness, palpable masses or nodules Respiratory:   Respiratory Effort Normal: No Use of Respiratory Muscles,No  Intercostal Retractions             Lungs Clear to Auscultation Bilaterally Cardiovascular:   Heart Auscultated: Regular Regular without any added sounds or murmurs              No Lower Extremity Edema Gastrointestinal:  Abdomen soft and nontender without palpable masses, guarding or rebound  No Palpable Splenomegaly or Hepatomegaly Lymphatic:  No Palpable Cervical Lymphadenopathy or Palpable No Axillary Lymphadenopathy Neurologic:  MENTAL STATUS: AAOx3, memory intact, fund of knowledge appropriate LANG/SPEECH:  Naming and repetition intact, fluent, follows 3-step commands CRANIAL NERVES: II: Pupils equal and reactive,  III, IV, VI: EOM intact, no gaze preference or deviation, no nystagmus. V: normal sensation in V1, V2, and V3 segments bilaterally VII: no asymmetry, no nasolabial fold flattening VIII: impaired hearing to speech symetric XI: 5/5 head turn and 5/5 shoulder shrug bilaterally XII: midline tongue protrusion MOTOR: 5/5 muscle power in Rt shoulder abductors/adductors, elbow flexors/extensors, wrist flexors/extensors,   5/5 in Rt hipflexors/extensors, knee flexors/extensors, ankle dorsiflexors and planter flexors. 5/5 muscle power in Lt shoulder abductors/adductors, elbow flexors/extensors, wrist flexors/extensors, f 5/5 in Lt hipflexors/extensors, knee flexors/extensors, ankle dorsiflexors and planter flexors. REFLEXES: 1/4 throughout, bilateral flexor plantar response, no Hoffman's, no clonus SENSORY: Normal to touch,  all limbs COORD: Normal finger to nose and heel to shin, no tremor, no dysmetria Psychiatric:  Patient Orientated to Time, Place and Person Patient with appropriate mood and affect Recent and Remote Memory Intact  Data Reviewed: ECG from 103 today independently reviewed and interpreted has significant artifact although it sinus rhythm with ventricular rate of 77 QTc of 461 with no ST changes  CT of the head reviewed without any ischemic changes or bleed   Assessment and Plan: * TIA (transient ischemic attack) Transient ischemic attack ABCD2 Score is 3 90-Day Stroke Risk: 3.1% Patient presents with 1 episode of expressive aphasia which was transient at 11 AM today with no other neurological symptoms and all aphasia has resolved, exam is currently benign CT head at [facility] negative for any bleed or ischemic event no acute intracranial pathology noted although advanced mild white matter disease Low suspicion that this is a mycotic embolism from her  recent dentist appointment given the lack of fever or chills however will obtain blood cultures if she develops any infective symptoms Plan : obtaining echocardiogram as well as MRI of the brain and MRA head and neck, lipid panel, A1c, PT, OT, SLP for language evaluation given dysphagia has been ruled out with a bedside swallow, starting aspirin as well as atorvastatin 40 mg, will defer to neurology regarding a second antiplatelet agent, we will continue telemetry and serial NIH, neurology consultation  Essential hypertension In the setting of acute stroke we will continue to monitor Currently systolic blood pressure in 160s    CKD (chronic kidney disease) stage  3, GFR 30-59 ml/min (HCC) Stage IIIa chronic kidney disease Estimated GFR is 55, creatinine is close to her baseline We will avoid nephrotoxins and keep volume replete Would not provide further fluids given the patient is not taking p.o. and is at her baseline creatinine  Hyponatremia Sodium is 132 mild and asymptomatic Will encourage solute intake and follow  Asthma, chronic Obstructive lung disease Lungs currently are clear we will continue her home albuterol as needed every 6 hours  Dysphagia Dysphagia ruled out Discussed with bedside nurse patient had bedside swallow at [facility] for which she passed We will proceed with a dysphagia diet   Migraine Migraine disorder Patient denies any recent symptoms Reports 2 episodes a year early denies any past or recent headache Will hold home Excedrin Migraine currently  B12 deficiency Patient currently on p.o. supplementation previously on IM monthly We will check a methylmalonic acid as well as a B12 level given her neurological presenting symptoms  Vitamin D deficiency Vitamin D deficiency Will continue 2000 units daily of vitamin D  Insomnia Insomnia Continue trazodone 25 mg at nighttime  Hypothyroidism Patient is on 25 mcg daily except sometimes she  takes 50 mcg on Tuesdays and Saturdays We will continue 25 mcg daily and check a TSH  GERD (gastroesophageal reflux disease) Gastroesophageal reflux disease Continue patient's home pantoprazole 40 mg daily     Advance Care Planning:   Code Status: DNR   Consults: Neurology  Family Communication: Attempted Daughter As per above, Updated the patient's son Dr. Billee Cashing at approximate 8:15 p.m.  Severity of Illness: The appropriate patient status for this patient is OBSERVATION. Observation status is judged to be reasonable and necessary in order to provide the required intensity of service to ensure the patient's safety. The patient's presenting symptoms, physical exam findings, and initial radiographic and laboratory data in the context of their medical condition is felt to place them at decreased risk for further clinical deterioration. Furthermore, it is anticipated that the patient will be medically stable for discharge from the hospital within 2 midnights of admission.   Author: Joelyn Oms, MD 12/12/2022 8:27 PM  For on call review [email or web address].

## 2022-12-12 NOTE — Assessment & Plan Note (Signed)
Insomnia Continue trazodone 25 mg at nighttime

## 2022-12-12 NOTE — Assessment & Plan Note (Signed)
Sodium is 132 mild and asymptomatic Will encourage solute intake and follow

## 2022-12-12 NOTE — ED Notes (Signed)
Report given to [facility]  

## 2022-12-12 NOTE — ED Notes (Signed)
ED TO INPATIENT HANDOFF REPORT  ED Nurse Name and Phone #: Wilmer Floor RN [phone]  S Name/Age/Gender Molly Williams 89 y.o. female Room/Bed: MH10/MH10  Code Status   Code Status: Not on file  Home/SNF/Other Home Patient oriented to: self, place, time, and situation Is this baseline? Yes   Triage Complete: Triage complete  Chief Complaint TIA (transient ischemic attack) [G45.9]  Triage Note Per daughter patient called her, about 1 hour prior to arrival,  to say she couldn't find her words or remember the name of her friend. Patient reports having diarrhea x 2 days. Patient able to talk in full sentences without difficulty during triage, patient able to remember name of friend and recall events.   Allergies Allergies  Allergen Reactions   Bee Venom Anaphylaxis   Codeine Nausea And Vomiting and Nausea Only   Iodine Hives    Contrast dye   Alendronate    Bacitracin Swelling    Ophthalmic ointment   Iodinated Contrast Media    Bactrim  [Sulfamethoxazole-Trimethoprim] Hives, Itching and Rash    Level of Care/Admitting Diagnosis ED Disposition     ED Disposition  Admit   Condition  --   Comment  Hospital Area: [facility] [[id number]]  Level of Care: Telemetry Medical [104]  Interfacility transfer: Yes  May place patient in observation at [facility] or [facility] if equivalent level of care is available:: No  Covid Evaluation: Asymptomatic - no recent exposure (last 10 days) testing not required  Diagnosis: TIA (transient ischemic attackPP:[id number]  Admitting Physician: Karmen Bongo [[id number]]  Attending Physician: Karmen Bongo [[id number]]          B Medical/Surgery History Past Medical History:  Diagnosis Date   Anemia 03/29/2015   Arthritis    Atrophic vaginitis    Cholelithiasis 2008   Fibrocystic breast disease    bx 2008 neg per pt report   GERD (gastroesophageal reflux disease)    Hearing loss    Hearing loss sensory, bilateral     bil/hearing aids   Hematuria    chronic Dr. Burnell Blanks   Hyperlipidemia 11/17/2016   Lupus (systemic lupus erythematosus) ([place])    Dr. Charlestine Night   Macular degeneration 11/17/2016   Migraine aura without headache    h/o ocular migraines   Osteopenia    Preventative health care 11/18/2016   Renal insufficiency 03/29/2015   Retinal detachment with break    right/laser   Retinal tear of right eye 11/17/2016   Skin lesion of face 01/09/2016   Spinal stenosis    Thyroid disease    hypothyroidism   Urinary tract bacterial infections    has had urethral dilations in past   Vitamin B12 deficiency 01/09/2016   Vitamin D deficiency 07/11/2015   Past Surgical History:  Procedure Laterality Date   APPENDECTOMY  2006   CATARACT EXTRACTION W/ INTRAOCULAR LENS  IMPLANT, BILATERAL     CHOLECYSTECTOMY     EYE SURGERY Bilateral    cataracts   TONSILLECTOMY  1940   VAGINAL HYSTERECTOMY     partial     A IV Location/Drains/Wounds Patient Lines/Drains/Airways Status     Active Line/Drains/Airways     Name Placement date Placement time Site Days   Peripheral IV 12/12/22 20 G Right Antecubital 12/12/22  1344  Antecubital  less than 1            Intake/Output Last 24 hours No intake or output data in the 24 hours ending 12/12/22 1553  Labs/Imaging  Results for orders placed or performed during the hospital encounter of 12/12/22 (from the past 48 hour(s))  CBG monitoring, ED     Status: None   Collection Time: 12/12/22 12:59 PM  Result Value Ref Range   Glucose-Capillary 84 70 - 99 mg/dL    Comment: Glucose reference range applies only to samples taken after fasting for at least 8 hours.  Protime-INR     Status: None   Collection Time: 12/12/22  1:36 PM  Result Value Ref Range   Prothrombin Time 13.6 11.4 - 15.2 seconds   INR 1.1 0.8 - 1.2    Comment: (NOTE) INR goal varies based on device and disease states. Performed at [facility], [address] [place], [place]  [address]   APTT     Status: None   Collection Time: 12/12/22  1:36 PM  Result Value Ref Range   aPTT 33 24 - 36 seconds    Comment: Performed at [facility], [address] [place], [place] [address]  CBC     Status: Abnormal   Collection Time: 12/12/22  1:36 PM  Result Value Ref Range   WBC 6.2 4.0 - 10.5 K/uL   RBC 3.85 (L) 3.87 - 5.11 MIL/uL   Hemoglobin 12.5 12.0 - 15.0 g/dL   HCT 36.9 36.0 - 46.0 %   MCV 95.8 80.0 - 100.0 fL   MCH 32.5 26.0 - 34.0 pg   MCHC 33.9 30.0 - 36.0 g/dL   RDW 13.1 11.5 - 15.5 %   Platelets 287 150 - 400 K/uL   nRBC 0.0 0.0 - 0.2 %    Comment: Performed at [facility], [address] [place], [place] [address]  Differential     Status: None   Collection Time: 12/12/22  1:36 PM  Result Value Ref Range   Neutrophils Relative % 71 %   Neutro Abs 4.4 1.7 - 7.7 K/uL   Lymphocytes Relative 16 %   Lymphs Abs 1.0 0.7 - 4.0 K/uL   Monocytes Relative 9 %   Monocytes Absolute 0.6 0.1 - 1.0 K/uL   Eosinophils Relative 3 %   Eosinophils Absolute 0.2 0.0 - 0.5 K/uL   Basophils Relative 1 %   Basophils Absolute 0.0 0.0 - 0.1 K/uL   Immature Granulocytes 0 %   Abs Immature Granulocytes 0.01 0.00 - 0.07 K/uL    Comment: Performed at [facility], [address] [place], [place] [address]  Comprehensive metabolic panel     Status: Abnormal   Collection Time: 12/12/22  1:36 PM  Result Value Ref Range   Sodium 132 (L) 135 - 145 mmol/L   Potassium 3.7 3.5 - 5.1 mmol/L   Chloride 101 98 - 111 mmol/L   CO2 22 22 - 32 mmol/L   Glucose, Bld 90 70 - 99 mg/dL    Comment: Glucose reference range applies only to samples taken after fasting for at least 8 hours.   BUN 15 8 - 23 mg/dL   Creatinine, Ser 0.99 0.44 - 1.00 mg/dL   Calcium 8.6 (L) 8.9 - 10.3 mg/dL   Total Protein 7.3 6.5 - 8.1 g/dL   Albumin 3.9 3.5 - 5.0 g/dL   AST 23 15 - 41 U/L   ALT 16 0 - 44 U/L   Alkaline Phosphatase 73 38 - 126 U/L   Total Bilirubin 0.8 0.3 -  1.2 mg/dL   GFR, Estimated 55 (L) >60  mL/min    Comment: (NOTE) Calculated using the CKD-EPI Creatinine Equation (2021)    Anion gap 9 5 - 15    Comment: Performed at [facility], [address] [place], [place] [address]  Ethanol     Status: None   Collection Time: 12/12/22  1:36 PM  Result Value Ref Range   Alcohol, Ethyl (B) <10 <10 mg/dL    Comment: (NOTE) Lowest detectable limit for serum alcohol is 10 mg/dL.  For medical purposes only. Performed at [facility], [address] [place], [place] [address]   Urine rapid drug screen (hosp performed)     Status: None   Collection Time: 12/12/22  2:31 PM  Result Value Ref Range   Opiates NONE DETECTED NONE DETECTED   Cocaine NONE DETECTED NONE DETECTED   Benzodiazepines NONE DETECTED NONE DETECTED   Amphetamines NONE DETECTED NONE DETECTED   Tetrahydrocannabinol NONE DETECTED NONE DETECTED   Barbiturates NONE DETECTED NONE DETECTED    Comment: (NOTE) DRUG SCREEN FOR MEDICAL PURPOSES ONLY.  IF CONFIRMATION IS NEEDED FOR ANY PURPOSE, NOTIFY LAB WITHIN 5 DAYS.  LOWEST DETECTABLE LIMITS FOR URINE DRUG SCREEN Drug Class                     Cutoff (ng/mL) Amphetamine and metabolites    1000 Barbiturate and metabolites    200 Benzodiazepine                 200 Opiates and metabolites        300 Cocaine and metabolites        300 THC                            50 Performed at [facility], [address] [place], [place] [address]   Urinalysis, Routine w reflex microscopic -Urine, Clean Catch     Status: Abnormal   Collection Time: 12/12/22  2:31 PM  Result Value Ref Range   Color, Urine YELLOW YELLOW   APPearance CLEAR CLEAR   Specific Gravity, Urine 1.020 1.005 - 1.030   pH 6.0 5.0 - 8.0   Glucose, UA NEGATIVE NEGATIVE mg/dL   Hgb urine dipstick MODERATE (A) NEGATIVE   Bilirubin Urine NEGATIVE NEGATIVE   Ketones, ur 15 (A) NEGATIVE mg/dL   Protein, ur NEGATIVE NEGATIVE mg/dL    Nitrite NEGATIVE NEGATIVE   Leukocytes,Ua NEGATIVE NEGATIVE    Comment: Performed at [facility], [address] [place], [place] [address]  Urinalysis, Microscopic (reflex)     Status: Abnormal   Collection Time: 12/12/22  2:31 PM  Result Value Ref Range   RBC / HPF 0-5 0 - 5 RBC/hpf   WBC, UA 6-10 0 - 5 WBC/hpf   Bacteria, UA MANY (A) NONE SEEN   Squamous Epithelial / HPF 0-5 0 - 5 /HPF    Comment: Performed at [facility], [address] [place], [place] [address]   CT Head Wo Contrast  Result Date: 12/12/2022 CLINICAL DATA:  Altered mental status, word finding difficulty EXAM: CT HEAD WITHOUT CONTRAST TECHNIQUE: Contiguous axial images were obtained from the base of the skull through the vertex without intravenous contrast. RADIATION DOSE REDUCTION: This exam was performed according to the departmental dose-optimization program which includes automated exposure control, adjustment of the mA and/or kV according to patient size and/or use of iterative reconstruction technique.  COMPARISON:  None Available. FINDINGS: Brain: No evidence of acute infarction, hemorrhage, hydrocephalus, extra-axial collection or mass lesion/mass effect. Periventricular and deep white matter hypodensity. Vascular: No hyperdense vessel or unexpected calcification. Skull: Normal. Negative for fracture or focal lesion. Sinuses/Orbits: No acute finding. Other: None. IMPRESSION: No acute intracranial pathology. Advanced small-vessel white matter disease. Electronically Signed   By: Delanna Ahmadi M.D.   On: 12/12/2022 13:39    Pending Labs Unresulted Labs (From admission, onward)    None       Vitals/Pain Today's Vitals   12/12/22 1258 12/12/22 1300 12/12/22 1530  BP: (!) 169/81  (!) 141/66  Pulse: 85  71  Resp: 18  19  Temp: 98.7 F (37.1 C)    TempSrc: Oral    SpO2: 100%  96%  Weight:  52.6 kg   Height:  5\' 2"  (1.575 m)   PainSc:  0-No pain     Isolation Precautions No active  isolations  Medications Medications  sodium chloride flush (NS) 0.9 % injection 3 mL (3 mLs Intravenous Given 12/12/22 1357)    Mobility walks with person assist     Focused Assessments Neuro Assessment Handoff:  Swallow screen pass? Yes    NIH Stroke Scale  Dizziness Present: No Headache Present: No Interval: Initial Level of Consciousness (1a.)   : Alert, keenly responsive LOC Questions (1b. )   : Answers both questions correctly LOC Commands (1c. )   : Performs both tasks correctly Best Gaze (2. )  : Normal Visual (3. )  : No visual loss Facial Palsy (4. )    : Normal symmetrical movements Motor Arm, Left (5a. )   : No drift Motor Arm, Right (5b. ) : No drift Motor Leg, Left (6a. )  : No drift Motor Leg, Right (6b. ) : No drift Limb Ataxia (7. ): Absent Sensory (8. )  : Normal, no sensory loss Best Language (9. )  : No aphasia Dysarthria (10. ): Normal Extinction/Inattention (11.)   : No Abnormality Complete NIHSS TOTAL: 0 Last date known well: 12/12/22 Last time known well: 0600 Neuro Assessment:   Neuro Checks:   Initial (12/12/22 1346)  Has TPA been given? No If patient is a Neuro Trauma and patient is going to OR before floor call report to [place] nurse: [phone] or [phone]   R Recommendations: See Admitting Provider Note  Report given to:   Additional Notes: NIH 0, GCS 15, passed stroke swallow screen, ambulatory

## 2022-12-12 NOTE — Assessment & Plan Note (Signed)
Stage IIIa chronic kidney disease Estimated GFR is 55, creatinine is close to her baseline We will avoid nephrotoxins and keep volume replete Would not provide further fluids given the patient is not taking p.o. and is at her baseline creatinine

## 2022-12-12 NOTE — ED Provider Notes (Signed)
[place] [facility] Provider Note   CSN: [id number] Arrival date & time: 12/12/22  1252     History  Chief Complaint  Patient presents with   Altered Mental Status    Molly Williams is a 89 y.o. female.  Patient with a history of high thyroidism, GERD presenting with difficulty speaking onset prior to 11 AM.  Patient states she lives alone.  Her friend "Bud" who lives across the street called her and realized she was not speaking normally.  He advised her to call her daughter immediately.  Patient called her daughter around noon time and at that time she was not able to say what she wanted though she was making sense and putting her words together but the thought content was not what she wanted to say.  She could not remember Bud's name.  Symptoms have since improved.  Daughter feels she is talking normally now.  Patient realized that she was not speaking the way she wanted to speak though her words were making sense there is no content.  There is no weakness to her arms or her legs.  No facial droop.  No headache, chest pain, shortness of breath, nausea or vomiting.  No room spinning dizziness or lightheadedness.  No fever.  She had diarrhea the past several days but this seems to be improving.  No nausea or vomiting.  States she was in the bathroom most of the day yesterday with diarrhea but cannot quantify how many episodes.  No blood in it.  No vomiting or fever.  Feels back to baseline now and daughter confirms.  Symptoms lasted apparently at least 1 hour possibly more.  The history is provided by the patient and a relative.  Altered Mental Status Associated symptoms: no fever, no headaches, no rash and no weakness        Home Medications Prior to Admission medications   Medication Sig Start Date End Date Taking? Authorizing Provider  acetaminophen (TYLENOL) 500 MG tablet Take 1,000 mg by mouth every 6 (six) hours as needed. pain     [provider]  albuterol (VENTOLIN HFA) 108 (90 Base) MCG/ACT inhaler Inhale 2 puffs into the lungs every 6 (six) hours as needed for wheezing or shortness of breath. 12/09/20   Mosie Lukes, MD  aspirin-acetaminophen-caffeine (EXCEDRIN MIGRAINE) [phone] MG tablet Take by mouth every 6 (six) hours as needed for headache.    [provider]  Cholecalciferol (VITAMIN D) 2000 units CAPS Take 2,000 Units by mouth daily.    [provider]  famotidine (PEPCID) 20 MG tablet Take 20 mg by mouth daily as needed for heartburn.     [provider]  levothyroxine (SYNTHROID) 25 MCG tablet TAKE ONE TABLET BY MOUTH ONE TIME DAILY EXCEPT ON TUESDAYS AND SATURDAYS TAKE 2 TABS 11/30/22   Mosie Lukes, MD  magic mouthwash (lidocaine, diphenhydrAMINE, alum & mag hydroxide) suspension Swish and spit 10 mLs 4 (four) times daily as needed for mouth pain. Ration for meds is 1:1:1:1 please. 05/27/22   Debbrah Alar, NP  omeprazole (PRILOSEC) 20 MG capsule Take 1 capsule (20 mg total) by mouth daily. 10/23/22   Mosie Lukes, MD  traZODone (DESYREL) 50 MG tablet TAKE 1/2 TO 1 (ONE-HALF TO ONE) TABLET BY MOUTH AT BEDTIME AS NEEDED FOR SLEEP 09/15/22   Mosie Lukes, MD  Turmeric 500 MG CAPS Take 1 capsule by mouth daily.    [provider]  Allergies    Bee venom, Codeine, Iodine, Alendronate, Bacitracin, Iodinated contrast media, and Bactrim  [sulfamethoxazole-trimethoprim]    Review of Systems   Review of Systems  Constitutional:  Negative for activity change, appetite change and fever.  HENT:  Negative for congestion.   Respiratory:  Negative for cough, chest tightness and shortness of breath.   Cardiovascular:  Negative for chest pain.  Gastrointestinal:  Positive for diarrhea.  Genitourinary:  Negative for dysuria and hematuria.  Musculoskeletal:  Negative for arthralgias and myalgias.  Skin:  Negative for rash.  Neurological:  Positive for speech  difficulty. Negative for dizziness, weakness and headaches.   all other systems are negative except as noted in the HPI and PMH.    Physical Exam Updated Vital Signs BP (!) 169/81 (BP Location: Left Arm)   Pulse 85   Temp 98.7 F (37.1 C) (Oral)   Resp 18   Ht 5\' 2"  (1.575 m)   Wt 52.6 kg   SpO2 100%   BMI 21.22 kg/m  Physical Exam Vitals and nursing note reviewed.  Constitutional:      General: She is not in acute distress.    Appearance: She is well-developed.  HENT:     Head: Normocephalic and atraumatic.     Mouth/Throat:     Pharynx: No oropharyngeal exudate.  Eyes:     Conjunctiva/sclera: Conjunctivae normal.     Pupils: Pupils are equal, round, and reactive to light.  Neck:     Comments: No meningismus. Cardiovascular:     Rate and Rhythm: Normal rate and regular rhythm.     Heart sounds: Normal heart sounds. No murmur heard. Pulmonary:     Effort: Pulmonary effort is normal. No respiratory distress.     Breath sounds: Normal breath sounds.  Abdominal:     Palpations: Abdomen is soft.     Tenderness: There is no abdominal tenderness. There is no guarding or rebound.  Musculoskeletal:        General: No tenderness. Normal range of motion.     Cervical back: Normal range of motion and neck supple.  Skin:    General: Skin is warm.  Neurological:     Mental Status: She is alert and oriented to person, place, and time.     Cranial Nerves: No cranial nerve deficit.     Motor: No abnormal muscle tone.     Coordination: Coordination normal.     Comments: Alert and oriented x 3. CN 2-12 intact, no ataxia on finger to nose, no nystagmus, 5/5 strength throughout, no pronator drift No ataxia on finger-to-nose.  No ataxia on heel-to-shin.  Psychiatric:        Behavior: Behavior normal.     ED Results / Procedures / Treatments   Labs (all labs ordered are listed, but only abnormal results are displayed) Labs Reviewed  CBC - Abnormal; Notable for the following  components:      Result Value   RBC 3.85 (*)    All other components within normal limits  COMPREHENSIVE METABOLIC PANEL - Abnormal; Notable for the following components:   Sodium 132 (*)    Calcium 8.6 (*)    GFR, Estimated 55 (*)    All other components within normal limits  URINALYSIS, ROUTINE W REFLEX MICROSCOPIC - Abnormal; Notable for the following components:   Hgb urine dipstick MODERATE (*)    Ketones, ur 15 (*)    All other components within normal limits  URINALYSIS, MICROSCOPIC (REFLEX) - Abnormal; Notable for  the following components:   Bacteria, UA MANY (*)    All other components within normal limits  PROTIME-INR  APTT  DIFFERENTIAL  ETHANOL  RAPID URINE DRUG SCREEN, HOSP PERFORMED  CBG MONITORING, ED  CBG MONITORING, ED    EKG EKG Interpretation  Date/Time:  Saturday December 12 2022 13:03:33 EDT Ventricular Rate:  77 PR Interval:  176 QRS Duration: 94 QT Interval:  410 QTC Calculation: 461 R Axis:   68 Text Interpretation: Sinus rhythm Low voltage, precordial leads Minimal ST depression, inferior leads No previous ECGs available Artifact Confirmed by Ezequiel Essex [phone]) on 12/12/2022 1:08:48 PM  Radiology CT Head Wo Contrast  Result Date: 12/12/2022 CLINICAL DATA:  Altered mental status, word finding difficulty EXAM: CT HEAD WITHOUT CONTRAST TECHNIQUE: Contiguous axial images were obtained from the base of the skull through the vertex without intravenous contrast. RADIATION DOSE REDUCTION: This exam was performed according to the departmental dose-optimization program which includes automated exposure control, adjustment of the mA and/or kV according to patient size and/or use of iterative reconstruction technique. COMPARISON:  None Available. FINDINGS: Brain: No evidence of acute infarction, hemorrhage, hydrocephalus, extra-axial collection or mass lesion/mass effect. Periventricular and deep white matter hypodensity. Vascular: No hyperdense vessel or  unexpected calcification. Skull: Normal. Negative for fracture or focal lesion. Sinuses/Orbits: No acute finding. Other: None. IMPRESSION: No acute intracranial pathology. Advanced small-vessel white matter disease. Electronically Signed   By: Delanna Ahmadi M.D.   On: 12/12/2022 13:39    Procedures Procedures    Medications Ordered in ED Medications  sodium chloride flush (NS) 0.9 % injection 3 mL (has no administration in time range)    ED Course/ Medical Decision Making/ A&P                             Medical Decision Making Amount and/or Complexity of Data Reviewed Independent Historian: caregiver Labs: ordered. Decision-making details documented in ED Course. Radiology: ordered and independent interpretation performed. Decision-making details documented in ED Course. ECG/medicine tests: ordered and independent interpretation performed. Decision-making details documented in ED Course.  Risk Decision regarding hospitalization.  Speech difficulty now resolved.  Patient describes speaking words but not able to say what she wanted to say.  No focal weakness, numbness or tingling.  No facial droop.  Code stroke not activated due to resolution of symptoms.  CT head negative for hemorrhage or large infarct.  Results reviewed and interpreted by me.  Discussed with neurology Dr. Luevenia Maxin.  He agrees with admission for TIA workup.  Labs reassuring. UA with pyuria. Denies UTI symptoms. Will send culture.   Back to baseline per patient and daughter in the room. Vitals remain stable.   Admission for TIA workup d/w Dr. Lorin Mercy.          Final Clinical Impression(s) / ED Diagnoses Final diagnoses:  TIA (transient ischemic attack)    Rx / DC Orders ED Discharge Orders     None         Lindy Pennisi, Annie Main, MD 12/12/22 [phone]

## 2022-12-12 NOTE — ED Notes (Signed)
Pt and pt's daughter updated on plan of care

## 2022-12-12 NOTE — Assessment & Plan Note (Signed)
Vitamin D deficiency Will continue 2000 units daily of vitamin D

## 2022-12-12 NOTE — Assessment & Plan Note (Signed)
Dysphagia ruled out Discussed with bedside nurse patient had bedside swallow at [facility] for which she passed We will proceed with a dysphagia diet

## 2022-12-12 NOTE — Assessment & Plan Note (Signed)
In the setting of acute stroke we will continue to monitor Currently systolic blood pressure in 160s

## 2022-12-12 NOTE — Assessment & Plan Note (Signed)
Migraine disorder Patient denies any recent symptoms Reports 2 episodes a year early denies any past or recent headache Will hold home Excedrin Migraine currently

## 2022-12-12 NOTE — Assessment & Plan Note (Signed)
Obstructive lung disease Lungs currently are clear we will continue her home albuterol as needed every 6 hours

## 2022-12-12 NOTE — ED Notes (Signed)
ED Provider in to see in triage

## 2022-12-12 NOTE — ED Triage Notes (Signed)
Per daughter patient called her, about 1 hour prior to arrival,  to say she couldn't find her words or remember the name of her friend. Patient reports having diarrhea x 2 days. Patient able to talk in full sentences without difficulty during triage, patient able to remember name of friend and recall events.

## 2022-12-12 NOTE — Assessment & Plan Note (Signed)
Patient currently on p.o. supplementation previously on IM monthly We will check a methylmalonic acid as well as a B12 level given her neurological presenting symptoms

## 2022-12-12 NOTE — Assessment & Plan Note (Signed)
Patient is on 25 mcg daily except sometimes she takes 50 mcg on Tuesdays and Saturdays We will continue 25 mcg daily and check a TSH

## 2022-12-12 NOTE — Plan of Care (Signed)

## 2022-12-12 NOTE — Progress Notes (Signed)
Plan of Care Note for accepted transfer   Patient: Molly Williams MRN: [id number]   [place]: 12/12/2022  Facility requesting transfer: [facility] Requesting Provider: [place] Reason for transfer: TIA  Facility course: Patient with h/o HLD, SLE, macular degeneration, and hypothyroidism presenting with a transient episode of expressive aphasia, resolved.  Non-focal exam otherwise.  Intermittent diarrhea for several days, improving.  VSS.  Neurology agrees that patient needs TIA evaluation.   Plan of care: The patient is accepted for observation to Telemetry unit, at [facility].   Author: Karmen Bongo, MD 12/12/2022  Check [email or web address] for on-call coverage.  Nursing staff, Please call [place] number on Amion as soon as patient's arrival, so appropriate admitting provider can evaluate the pt.

## 2022-12-13 ENCOUNTER — Observation Stay (HOSPITAL_BASED_OUTPATIENT_CLINIC_OR_DEPARTMENT_OTHER): Payer: Medicare Other

## 2022-12-13 DIAGNOSIS — G459 Transient cerebral ischemic attack, unspecified: Secondary | ICD-10-CM

## 2022-12-13 DIAGNOSIS — I10 Essential (primary) hypertension: Secondary | ICD-10-CM | POA: Diagnosis not present

## 2022-12-13 DIAGNOSIS — E871 Hypo-osmolality and hyponatremia: Secondary | ICD-10-CM | POA: Diagnosis not present

## 2022-12-13 DIAGNOSIS — E782 Mixed hyperlipidemia: Secondary | ICD-10-CM

## 2022-12-13 DIAGNOSIS — I5032 Chronic diastolic (congestive) heart failure: Secondary | ICD-10-CM | POA: Diagnosis present

## 2022-12-13 DIAGNOSIS — G43109 Migraine with aura, not intractable, without status migrainosus: Secondary | ICD-10-CM

## 2022-12-13 LAB — BASIC METABOLIC PANEL
Anion gap: 10 (ref 5–15)
BUN: 11 mg/dL (ref 8–23)
CO2: 23 mmol/L (ref 22–32)
Calcium: 8.9 mg/dL (ref 8.9–10.3)
Chloride: 102 mmol/L (ref 98–111)
Creatinine, Ser: 1.03 mg/dL — ABNORMAL HIGH (ref 0.44–1.00)
GFR, Estimated: 52 mL/min — ABNORMAL LOW (ref >60)
Glucose, Bld: 74 mg/dL (ref 70–99)
Potassium: 3.8 mmol/L (ref 3.5–5.1)
Sodium: 135 mmol/L (ref 135–145)

## 2022-12-13 LAB — ECHOCARDIOGRAM COMPLETE
AR max vel: 2.85 cm2
AV Area VTI: 2.71 cm2
AV Area mean vel: 2.75 cm2
AV Mean grad: 9 mmHg
AV Peak grad: 15.1 mmHg
Ao pk vel: 1.94 m/s
Area-P 1/2: 2.39 cm2
Est EF: >75
Height: 62 in
S' Lateral: 1.5 cm
Weight: 1915.36 oz

## 2022-12-13 MED ORDER — CLOPIDOGREL BISULFATE 75 MG PO TABS: 75.0000 mg | ORAL_TABLET | Freq: Every day | ORAL | 0 refills | Status: DC

## 2022-12-13 MED ORDER — ATORVASTATIN CALCIUM 40 MG PO TABS: 40.0000 mg | ORAL_TABLET | Freq: Every day | ORAL | 11 refills | Status: AC

## 2022-12-13 MED ORDER — CLOPIDOGREL BISULFATE 75 MG PO TABS
75.0000 mg | ORAL_TABLET | Freq: Every day | ORAL | Status: DC
  Administered 2022-12-13: 75 mg via ORAL
  Filled 2022-12-13: qty 1

## 2022-12-13 MED ORDER — VITAMIN D 25 MCG (1000 UNIT) PO TABS
2000.0000 [IU] | ORAL_TABLET | Freq: Every day | ORAL | Status: DC
  Administered 2022-12-13: 2000 [IU] via ORAL
  Filled 2022-12-13: qty 2

## 2022-12-13 MED ORDER — ASPIRIN 81 MG PO TBEC
81.0000 mg | DELAYED_RELEASE_TABLET | Freq: Every day | ORAL | Status: DC
  Administered 2022-12-13: 81 mg via ORAL
  Filled 2022-12-13: qty 1

## 2022-12-13 MED ORDER — ASPIRIN 81 MG PO TBEC: 81.0000 mg | DELAYED_RELEASE_TABLET | Freq: Every day | ORAL | 12 refills | Status: AC

## 2022-12-13 NOTE — Evaluation (Signed)
Speech Language Pathology Evaluation Patient Details Name: Molly Williams MRN: [id number] DOB: [birth year] Today's Date: 12/13/2022 Time: 0940-1004 SLP Time Calculation (min) (ACUTE ONLY): 24 min  Problem List:  Patient Active Problem List   Diagnosis Date Noted   TIA (transient ischemic attack) 12/12/2022   Dysphagia 12/12/2022   Asthma, chronic 12/12/2022   Hyponatremia 12/12/2022   CKD (chronic kidney disease) stage 3, GFR 30-59 ml/min ([place]) 12/12/2022   Essential hypertension 12/12/2022   Mouth pain 05/27/2022   Hyperkalemia 01/11/2022   Sun-damaged skin 01/11/2022   Mild cognitive impairment 04/24/2021   H/O colonoscopy with polypectomy 01/02/2020   Migraine 06/26/2019   Low back pain 01/13/2018   Muscle cramps 01/13/2018   Preventative health care 11/18/2016   Hyperlipidemia 11/17/2016   Atherosclerosis of aorta ([place]) 11/17/2016   Palpitations 11/17/2016   Retinal tear of right eye 11/17/2016   Macular degeneration 11/17/2016   Skin lesion of face 01/09/2016   B12 deficiency 01/09/2016   Vitamin D deficiency 07/11/2015   Renal insufficiency 03/29/2015   Anemia 03/29/2015   Hyperglycemia 01/30/2015   Osteoporosis 09/27/2013   Insomnia 09/27/2013   Allergic rhinitis 12/26/2012   GERD (gastroesophageal reflux disease) 04/21/2011   Hypothyroidism    Arthritis    Urinary tract bacterial infections    Atrophic vaginitis    Spinal stenosis    Fibrocystic breast disease    Lupus (systemic lupus erythematosus) ([place])    Hearing loss sensory, bilateral    Past Medical History:  Past Medical History:  Diagnosis Date   Anemia 03/29/2015   Arthritis    Atrophic vaginitis    Cholelithiasis 2008   Fibrocystic breast disease    bx 2008 neg per pt report   GERD (gastroesophageal reflux disease)    Hearing loss    Hearing loss sensory, bilateral    bil/hearing aids   Hematuria    chronic Dr. Burnell Blanks   Hyperlipidemia 11/17/2016   Lupus (systemic lupus erythematosus)  ([place])    Dr. Charlestine Night   Macular degeneration 11/17/2016   Migraine aura without headache    h/o ocular migraines   Osteopenia    Preventative health care 11/18/2016   Renal insufficiency 03/29/2015   Retinal detachment with break    right/laser   Retinal tear of right eye 11/17/2016   Skin lesion of face 01/09/2016   Spinal stenosis    Thyroid disease    hypothyroidism   Urinary tract bacterial infections    has had urethral dilations in past   Vitamin B12 deficiency 01/09/2016   Vitamin D deficiency 07/11/2015   Past Surgical History:  Past Surgical History:  Procedure Laterality Date   APPENDECTOMY  2006   CATARACT EXTRACTION W/ INTRAOCULAR LENS  IMPLANT, BILATERAL     CHOLECYSTECTOMY     EYE SURGERY Bilateral    cataracts   TONSILLECTOMY  1940   VAGINAL HYSTERECTOMY     partial   HPI:  89 yo female adm to [facility] with language deficits. Pt has h/o complex migraine.  All imaging negative.  SLP, PT, OT ordered.   Assessment / Plan / Recommendation Clinical Impression  [place] Aphasia Battery Bedside completed with pt demonstrating difficulties only in following complex directions and with repetition of single word "bed" - suspect this is related to her hearing perception *pt heard beg*.  Otherwise she scored 100% accurate on all evaluation tasks demonstrate fluent receptive and expressive language abilities.  She is fluent - verbose - and clearly articulate.  No SLP follow up  indicated as she is at her baseline function.    SLP Assessment  SLP Recommendation/Assessment: Patient does not need any further Speech [place] [facility] SLP Visit Diagnosis: Aphasia (R47.01)    Recommendations for follow up therapy are one component of a multi-disciplinary discharge planning process, led by the attending physician.  Recommendations may be updated based on patient status, additional functional criteria and insurance authorization.    Follow Up Recommendations  No SLP follow up     Assistance Recommended at Discharge  None  Functional Status Assessment  N/a  Frequency and Duration   N/a        SLP Evaluation Cognition  Overall Cognitive Status: Within Functional Limits for tasks assessed Arousal/Alertness: Awake/alert Orientation Level: Oriented X4 Year: 2024 Month: March Day of Week: Correct Attention: Sustained Sustained Attention: Appears intact Memory: Appears intact Awareness: Appears intact Problem Solving: Appears intact Safety/Judgment: Appears intact       Comprehension  Auditory Comprehension Overall Auditory Comprehension: Appears within functional limits for tasks assessed Yes/No Questions: Within Functional Limits Commands: Impaired Two Step Basic Commands: 75-100% accurate Multistep Basic Commands: 75-100% accurate Complex Commands: 50-74% accurate Conversation: Complex Visual Recognition/Discrimination Discrimination: Not tested Reading Comprehension Reading Status: Within funtional limits    Expression Expression Primary Mode of Expression: Verbal Verbal Expression Overall Verbal Expression: Appears within functional limits for tasks assessed Initiation: No impairment Level of Generative/Spontaneous Verbalization: Sentence;Conversation Repetition: No impairment Naming: No impairment Written Expression Dominant Hand: Right Written Expression: Within Functional Limits   Oral / Motor  Oral Motor/Sensory Function Overall Oral Motor/Sensory Function: Within functional limits Motor Speech Overall Motor Speech: Appears within functional limits for tasks assessed Respiration: Within functional limits Resonance: Within functional limits Articulation: Within functional limitis Motor Planning: Witnin functional limits           Kathleen Lime, MS [facility] SLP Acute Rehab Services Office [phone]  Macario Golds 12/13/2022, 10:13 AM

## 2022-12-13 NOTE — Discharge Instructions (Signed)
Do not take omeprazole for next 3 weeks while on PLavix

## 2022-12-13 NOTE — Discharge Summary (Signed)
Physician Discharge Summary   Patient: Molly Williams MRN: [id number] DOB: [birth year]  Admit date:     12/12/2022  Discharge date: 12/13/22  Discharge Physician: Annita Brod   PCP: Mosie Lukes, MD   Recommendations at discharge:   New medication: Plavix 75 mg p.o. daily x 3 weeks Medication adjustment: While patient is on Plavix, has been told to hold her Prilosec New medication: Aspirin 81 mg p.o. daily New medication: Lipitor 40 mg p.o. daily   Discharge Diagnoses: Principal Problem:   TIA (transient ischemic attack) Active Problems:   Hyperlipidemia   Essential hypertension   Dysphagia   Chronic diastolic CHF (congestive heart failure) (HCC)   Migraine   Asthma, chronic   Hypothyroidism   CKD (chronic kidney disease) stage 3, GFR 30-59 ml/min (HCC)   GERD (gastroesophageal reflux disease)   B12 deficiency   Vitamin D deficiency   Insomnia  Resolved Problems:   Hyponatremia  Hospital Course: Pleasant 89 year old female with past medical history of hyperlipidemia and hypothyroidism who presented with an episode of expressive aphasia starting at approximately 11 AM on morning of 3/30.  Episode lasted for about 10 minutes.  No other complaints such as focal weakness, facial droop.  She presented then to the emergency room.  Initial lab work and CT scan of head were unremarkable.  Patient was brought in for further evaluation for suspected TIA.  Allow for some permissive hypertension during hospitalization.  Assessment and Plan: * TIA (transient ischemic attack) Underwent full stroke workup.  Seen by neurology.  Placed on aspirin indefinitely plus Plavix x 3 weeks.  Also found to have elevated lipid panel.  She had briefly been on a statin many years ago, but had since stopped.  LDL at 98 and have restarted medication.  Cleared by speech and PT/OT.  Echocardiogram unremarkable.  Hyperlipidemia Started on Lipitor.  As below, LDL at 98 with target below  70  Essential hypertension Allowing for permissive hypertension during hospitalization.   Hyponatremia-resolved as of 12/13/2022 Sodium is 132 mild and asymptomatic on admission.  At 135 on day of discharge.  Chronic diastolic CHF (congestive heart failure) (HCC) Euvolemic.  Incidentally noted has grade 1 diastolic dysfunction on echocardiogram  Dysphagia Dysphagia ruled out Discussed with bedside nurse patient had bedside swallow at [facility] for which she passed We will proceed with a dysphagia diet   Asthma, chronic Obstructive lung disease Lungs currently are clear we will continue her home albuterol as needed every 6 hours  Migraine Migraine disorder Patient denies any recent symptoms Reports 2 episodes a year early denies any past or recent headache Will hold home Excedrin Migraine currently  CKD (chronic kidney disease) stage 3, GFR 30-59 ml/min (HCC) Stage IIIa chronic kidney disease Estimated GFR is 55, creatinine is close to her baseline  Hypothyroidism Patient is on 25 mcg daily except sometimes she takes 50 mcg on Tuesdays and Saturdays  GERD (gastroesophageal reflux disease) Patient normally takes it probably cycle most every day.  Given the interaction with Plavix, advised patient to hold this for the next 3 weeks.  B12 deficiency Patient currently on p.o. supplementation previously on IM monthly  Vitamin D deficiency Vitamin D deficiency Will continue 2000 units daily of vitamin D  Insomnia Insomnia Continue trazodone 25 mg at nighttime         Consultants: Neurology Procedures performed: Echocardiogram Disposition: Home Diet recommendation:  Discharge Diet Orders (From admission, onward)     Start  Ordered   12/13/22 0000  Diet - low sodium heart healthy        12/13/22 1157           Heart healthy DISCHARGE MEDICATION: Allergies as of 12/13/2022       Reactions   Bee Venom Anaphylaxis   Codeine Nausea And  Vomiting, Nausea Only   Iodine Hives   Contrast dye   Alendronate    Bacitracin Swelling   Ophthalmic ointment   Iodinated Contrast Media    Bactrim  [sulfamethoxazole-trimethoprim] Hives, Itching, Rash        Medication List     TAKE these medications    albuterol 108 (90 Base) MCG/ACT inhaler Commonly known as: VENTOLIN HFA Inhale 2 puffs into the lungs every 6 (six) hours as needed for wheezing or shortness of breath.   aspirin EC 81 MG tablet Take 1 tablet (81 mg total) by mouth daily. Swallow whole. Start taking on: December 14, 2022   atorvastatin 40 MG tablet Commonly known as: LIPITOR Take 1 tablet (40 mg total) by mouth daily. Start taking on: December 14, 2022   clopidogrel 75 MG tablet Commonly known as: PLAVIX Take 1 tablet (75 mg total) by mouth daily. Start taking on: December 14, 2022   levothyroxine 25 MCG tablet Commonly known as: SYNTHROID TAKE ONE TABLET BY MOUTH ONE TIME DAILY EXCEPT ON TUESDAYS AND SATURDAYS TAKE 2 TABS   omeprazole 20 MG capsule Commonly known as: PRILOSEC Take 1 capsule (20 mg total) by mouth daily.   traZODone 50 MG tablet Commonly known as: DESYREL TAKE 1/2 TO 1 (ONE-HALF TO ONE) TABLET BY MOUTH AT BEDTIME AS NEEDED FOR SLEEP   Vitamin D 50 MCG (2000 UT) Caps Take 2,000 Units by mouth daily.        Follow-up Information     Rondel Jumbo, PA-C. Schedule an appointment as soon as possible for a visit in 1 month(s).   Specialty: Neurology Contact information: [address] [place] [address] [phone]                Discharge Exam: Danley Danker Weights   12/12/22 1300 12/12/22 1659  Weight: 52.6 kg 54.3 kg   General: Alert and oriented x 3, no acute distress Cardiovascular: Regular rate and rhythm, S1-S2  Condition at discharge: good  The results of significant diagnostics from this hospitalization (including imaging, microbiology, ancillary and laboratory) are listed below for reference.    Imaging Studies: ECHOCARDIOGRAM COMPLETE  Result Date: 12/13/2022    ECHOCARDIOGRAM REPORT   Patient Name:   Molly Williams Date of Exam: 12/13/2022 Medical Rec #:  [id number]        Height:       62.0 in Accession #:    [id number]       Weight:       119.7 lb Date of Birth:  1930-12-12        BSA:          1.537 m Patient Age:    89 years         BP:           133/66 mmHg Patient Gender: F                HR:           60 bpm. Exam Location:  Inpatient Procedure: 2D Echo, Color Doppler and Cardiac Doppler Indications:    TIA  History:  Patient has prior history of Echocardiogram examinations, most                 recent 12/23/2016. Chronic kidney disease. Lupus; Risk                 Factors:Hypertension and Dyslipidemia.  Sonographer:    Johny Chess RDCS Referring Phys: [id number] ANDREW C CORE  Sonographer Comments: Image acquisition challenging due to respiratory motion. IMPRESSIONS  1. Left ventricular ejection fraction, by estimation, is >75%. The left ventricle has hyperdynamic function. The left ventricle has no regional wall motion abnormalities. Left ventricular diastolic parameters are consistent with Grade I diastolic dysfunction (impaired relaxation).  2. Right ventricular systolic function is normal. The right ventricular size is normal. Tricuspid regurgitation signal is inadequate for assessing PA pressure.  3. The mitral valve is normal in structure. No evidence of mitral valve regurgitation. No evidence of mitral stenosis.  4. The aortic valve was not well visualized. Aortic valve regurgitation is mild to moderate. No aortic stenosis is present.  5. The inferior vena cava is normal in size with greater than 50% respiratory variability, suggesting right atrial pressure of 3 mmHg. FINDINGS  Left Ventricle: Left ventricular ejection fraction, by estimation, is >75%. The left ventricle has hyperdynamic function. The left ventricle has no regional wall motion abnormalities. The left  ventricular internal cavity size was normal in size. There is no left ventricular hypertrophy. Left ventricular diastolic parameters are consistent with Grade I diastolic dysfunction (impaired relaxation). Normal left ventricular filling pressure. Right Ventricle: The right ventricular size is normal. Right vetricular wall thickness was not well visualized. Right ventricular systolic function is normal. Tricuspid regurgitation signal is inadequate for assessing PA pressure. Left Atrium: Left atrial size was normal in size. Right Atrium: Right atrial size was normal in size. Pericardium: There is no evidence of pericardial effusion. Mitral Valve: The mitral valve is normal in structure. No evidence of mitral valve regurgitation. No evidence of mitral valve stenosis. Tricuspid Valve: The tricuspid valve is normal in structure. Tricuspid valve regurgitation is trivial. No evidence of tricuspid stenosis. Aortic Valve: The aortic valve was not well visualized. Aortic valve regurgitation is mild to moderate. No aortic stenosis is present. Aortic valve mean gradient measures 9.0 mmHg. Aortic valve peak gradient measures 15.1 mmHg. Aortic valve area, by VTI measures 2.71 cm. Pulmonic Valve: The pulmonic valve was not well visualized. Pulmonic valve regurgitation is not visualized. No evidence of pulmonic stenosis. Aorta: The aortic root is normal in size and structure. Venous: The inferior vena cava is normal in size with greater than 50% respiratory variability, suggesting right atrial pressure of 3 mmHg. IAS/Shunts: No atrial level shunt detected by color flow Doppler.  LEFT VENTRICLE PLAX 2D LVIDd:         3.50 cm   Diastology LVIDs:         1.50 cm   LV e' medial:    6.64 cm/s LV PW:         0.90 cm   LV E/e' medial:  10.8 LV IVS:        1.00 cm   LV e' lateral:   8.38 cm/s LVOT diam:     1.90 cm   LV E/e' lateral: 8.6 LV SV:         133 LV SV Index:   86 LVOT Area:     2.84 cm  RIGHT VENTRICLE             IVC RV  Basal diam:  2.20 cm     IVC diam: 1.50 cm RV S prime:     16.30 cm/s TAPSE (M-mode): 1.9 cm LEFT ATRIUM             Index        RIGHT ATRIUM          Index LA diam:        2.80 cm 1.82 cm/m   RA Area:     9.13 cm LA Vol (A2C):   37.8 ml 24.59 ml/m  RA Volume:   16.00 ml 10.41 ml/m LA Vol (A4C):   31.0 ml 20.17 ml/m LA Biplane Vol: 34.8 ml 22.64 ml/m  AORTIC VALVE AV Area (Vmax):    2.85 cm AV Area (Vmean):   2.75 cm AV Area (VTI):     2.71 cm AV Vmax:           194.00 cm/s AV Vmean:          140.000 cm/s AV VTI:            0.489 m AV Peak Grad:      15.1 mmHg AV Mean Grad:      9.0 mmHg LVOT Vmax:         195.00 cm/s LVOT Vmean:        136.000 cm/s LVOT VTI:          0.468 m LVOT/AV VTI ratio: 0.96  AORTA Ao Root diam: 2.80 cm Ao Asc diam:  3.30 cm MITRAL VALVE MV Area (PHT): 2.39 cm    SHUNTS MV Decel Time: 317 msec    Systemic VTI:  0.47 m MV E velocity: 71.70 cm/s  Systemic Diam: 1.90 cm MV A velocity: 85.20 cm/s MV E/A ratio:  0.84 Carlyle Dolly MD Electronically signed by Carlyle Dolly MD Signature Date/Time: 12/13/2022/9:44:41 AM    Final    MR ANGIO NECK WO CONTRAST  Result Date: 12/13/2022 CLINICAL DATA:  Transient ischemic attack EXAM: MRA NECK WITHOUT CONTRAST TECHNIQUE: Angiographic images of the neck were acquired using MRA technique without intravenous contrast. Carotid stenosis measurements (when applicable) are obtained utilizing NASCET criteria, using the distal internal carotid diameter as the denominator. COMPARISON:  None Available. FINDINGS: Aortic arch: Normal branching pattern Right carotid system: Normal Left carotid system: Normal Vertebral arteries: Codominant system is normal. Other: None. IMPRESSION: Normal MRA of the neck. Electronically Signed   By: Ulyses Jarred M.D.   On: 12/13/2022 00:39   MR BRAIN WO CONTRAST  Result Date: 12/13/2022 CLINICAL DATA:  Transient ischemic attack EXAM: MRI HEAD WITHOUT CONTRAST MRA HEAD WITHOUT CONTRAST TECHNIQUE: Multiplanar,  multi-echo pulse sequences of the brain and surrounding structures were acquired without intravenous contrast. Angiographic images of the Circle of Willis were acquired using MRA technique without intravenous contrast. COMPARISON:  None Available. FINDINGS: MRI HEAD FINDINGS Brain: No acute infarct, mass effect or extra-axial collection. No chronic microhemorrhage or siderosis. There is multifocal hyperintense T2-weighted signal within the white matter. Generalized volume loss. The midline structures are normal. Vascular: Normal flow voids. Skull and upper cervical spine: Normal marrow signal. Sinuses/Orbits: No acute or significant finding. Other: None. MRA HEAD FINDINGS POSTERIOR CIRCULATION: --Vertebral arteries: Normal --Inferior cerebellar arteries: Normal. --Basilar artery: Normal. --Superior cerebellar arteries: Normal. --Posterior cerebral arteries: Normal. Fetal predominant origin of the right PCA. ANTERIOR CIRCULATION: --Intracranial internal carotid arteries: Normal. --Anterior cerebral arteries (ACA): Normal. --Middle cerebral arteries (MCA): Normal. IMPRESSION: 1. No acute intracranial abnormality. 2. Normal intracranial MRA. 3. Chronic small vessel  ischemic disease and volume loss. Electronically Signed   By: Ulyses Jarred M.D.   On: 12/13/2022 00:18   MR ANGIO HEAD WO CONTRAST  Result Date: 12/13/2022 CLINICAL DATA:  Transient ischemic attack EXAM: MRI HEAD WITHOUT CONTRAST MRA HEAD WITHOUT CONTRAST TECHNIQUE: Multiplanar, multi-echo pulse sequences of the brain and surrounding structures were acquired without intravenous contrast. Angiographic images of the Circle of Willis were acquired using MRA technique without intravenous contrast. COMPARISON:  None Available. FINDINGS: MRI HEAD FINDINGS Brain: No acute infarct, mass effect or extra-axial collection. No chronic microhemorrhage or siderosis. There is multifocal hyperintense T2-weighted signal within the white matter. Generalized volume loss.  The midline structures are normal. Vascular: Normal flow voids. Skull and upper cervical spine: Normal marrow signal. Sinuses/Orbits: No acute or significant finding. Other: None. MRA HEAD FINDINGS POSTERIOR CIRCULATION: --Vertebral arteries: Normal --Inferior cerebellar arteries: Normal. --Basilar artery: Normal. --Superior cerebellar arteries: Normal. --Posterior cerebral arteries: Normal. Fetal predominant origin of the right PCA. ANTERIOR CIRCULATION: --Intracranial internal carotid arteries: Normal. --Anterior cerebral arteries (ACA): Normal. --Middle cerebral arteries (MCA): Normal. IMPRESSION: 1. No acute intracranial abnormality. 2. Normal intracranial MRA. 3. Chronic small vessel ischemic disease and volume loss. Electronically Signed   By: Ulyses Jarred M.D.   On: 12/13/2022 00:18   CT Head Wo Contrast  Result Date: 12/12/2022 CLINICAL DATA:  Altered mental status, word finding difficulty EXAM: CT HEAD WITHOUT CONTRAST TECHNIQUE: Contiguous axial images were obtained from the base of the skull through the vertex without intravenous contrast. RADIATION DOSE REDUCTION: This exam was performed according to the departmental dose-optimization program which includes automated exposure control, adjustment of the mA and/or kV according to patient size and/or use of iterative reconstruction technique. COMPARISON:  None Available. FINDINGS: Brain: No evidence of acute infarction, hemorrhage, hydrocephalus, extra-axial collection or mass lesion/mass effect. Periventricular and deep white matter hypodensity. Vascular: No hyperdense vessel or unexpected calcification. Skull: Normal. Negative for fracture or focal lesion. Sinuses/Orbits: No acute finding. Other: None. IMPRESSION: No acute intracranial pathology. Advanced small-vessel white matter disease. Electronically Signed   By: Delanna Ahmadi M.D.   On: 12/12/2022 13:39    Microbiology: Results for orders placed or performed in visit on 09/10/20  Urine  Culture     Status: None   Collection Time: 09/10/20  2:34 PM   Specimen: Urine  Result Value Ref Range Status   MICRO NUMBER: [id number]  Final   SPECIMEN QUALITY: Adequate  Final   Sample Source URINE  Final   STATUS: FINAL  Final   Result: No Growth  Final    Labs: CBC: Recent Labs  Lab 12/12/22 1336  WBC 6.2  NEUTROABS 4.4  HGB 12.5  HCT 36.9  MCV 95.8  PLT [id number]   Basic Metabolic Panel: Recent Labs  Lab 12/12/22 1336 12/13/22 0739  NA 132* 135  K 3.7 3.8  CL 101 102  CO2 22 23  GLUCOSE 90 74  BUN 15 11  CREATININE 0.99 1.03*  CALCIUM 8.6* 8.9   Liver Function Tests: Recent Labs  Lab 12/12/22 1336  AST 23  ALT 16  ALKPHOS 73  BILITOT 0.8  PROT 7.3  ALBUMIN 3.9   CBG: Recent Labs  Lab 12/12/22 1259  GLUCAP 84    Discharge time spent: less than 30 minutes.  Signed: Annita Brod, MD [facility] 12/13/2022

## 2022-12-13 NOTE — Progress Notes (Addendum)
STROKE TEAM PROGRESS NOTE   INTERVAL HISTORY Her daughter is at the bedside.  Patient states yesterday she was unable to formulate a sentence and was unable to get words out symptoms had resolved prior to getting to the hospital.  Aphasia lasted 1 to 2 hours.  She denies headache at this time, remembers the whole event, denies weakness slurred speech facial droop or vision changes. Her and her daughter both endorse that she has migraines with aura where she has aphasia or vision problems, however those were 20 so years ago and she has not had a headache recently.  Yesterday she denies headache and the speech difficulties came on all of a sudden Recommend aspirin 81 mg and 75 mg Plavix daily for 3 weeks then aspirin alone, as well as atorvastatin 40 mg daily Echo results are pending Neurology will sign off  Vitals:   12/12/22 1659 12/12/22 1947 12/13/22 0322 12/13/22 0748  BP: (!) 156/65 (!) 150/112 (!) 110/48 133/66  Pulse: 69 80 64 66  Resp:  17 17 18   Temp: 98.6 F (37 C) 98.4 F (36.9 C) 98.4 F (36.9 C) 97.8 F (36.6 C)  TempSrc: Oral Oral Oral Oral  SpO2: 99% 95% 97% 98%  Weight: 54.3 kg     Height: 5\' 2"  (1.575 m)      CBC:  Recent Labs  Lab 12/12/22 1336  WBC 6.2  NEUTROABS 4.4  HGB 12.5  HCT 36.9  MCV 95.8  PLT [id number]   Basic Metabolic Panel:  Recent Labs  Lab 12/12/22 1336 12/13/22 0739  NA 132* 135  K 3.7 3.8  CL 101 102  CO2 22 23  GLUCOSE 90 74  BUN 15 11  CREATININE 0.99 1.03*  CALCIUM 8.6* 8.9   Lipid Panel:  Recent Labs  Lab 12/12/22 2148  CHOL 210*  TRIG 62  HDL 100  CHOLHDL 2.1  VLDL 12  LDLCALC 98   HgbA1c: No results for input(s): "HGBA1C" in the last 168 hours. Urine Drug Screen:  Recent Labs  Lab 12/12/22 1431  LABOPIA NONE DETECTED  COCAINSCRNUR NONE DETECTED  LABBENZ NONE DETECTED  AMPHETMU NONE DETECTED  THCU NONE DETECTED  LABBARB NONE DETECTED    Alcohol Level  Recent Labs  Lab 12/12/22 1336  ETH <10    IMAGING past  24 hours MR ANGIO NECK WO CONTRAST  Result Date: 12/13/2022 CLINICAL DATA:  Transient ischemic attack EXAM: MRA NECK WITHOUT CONTRAST TECHNIQUE: Angiographic images of the neck were acquired using MRA technique without intravenous contrast. Carotid stenosis measurements (when applicable) are obtained utilizing NASCET criteria, using the distal internal carotid diameter as the denominator. COMPARISON:  None Available. FINDINGS: Aortic arch: Normal branching pattern Right carotid system: Normal Left carotid system: Normal Vertebral arteries: Codominant system is normal. Other: None. IMPRESSION: Normal MRA of the neck. Electronically Signed   By: Ulyses Jarred M.D.   On: 12/13/2022 00:39   MR BRAIN WO CONTRAST  Result Date: 12/13/2022 CLINICAL DATA:  Transient ischemic attack EXAM: MRI HEAD WITHOUT CONTRAST MRA HEAD WITHOUT CONTRAST TECHNIQUE: Multiplanar, multi-echo pulse sequences of the brain and surrounding structures were acquired without intravenous contrast. Angiographic images of the Circle of Willis were acquired using MRA technique without intravenous contrast. COMPARISON:  None Available. FINDINGS: MRI HEAD FINDINGS Brain: No acute infarct, mass effect or extra-axial collection. No chronic microhemorrhage or siderosis. There is multifocal hyperintense T2-weighted signal within the white matter. Generalized volume loss. The midline structures are normal. Vascular: Normal flow voids. Skull and  upper cervical spine: Normal marrow signal. Sinuses/Orbits: No acute or significant finding. Other: None. MRA HEAD FINDINGS POSTERIOR CIRCULATION: --Vertebral arteries: Normal --Inferior cerebellar arteries: Normal. --Basilar artery: Normal. --Superior cerebellar arteries: Normal. --Posterior cerebral arteries: Normal. Fetal predominant origin of the right PCA. ANTERIOR CIRCULATION: --Intracranial internal carotid arteries: Normal. --Anterior cerebral arteries (ACA): Normal. --Middle cerebral arteries (MCA):  Normal. IMPRESSION: 1. No acute intracranial abnormality. 2. Normal intracranial MRA. 3. Chronic small vessel ischemic disease and volume loss. Electronically Signed   By: Ulyses Jarred M.D.   On: 12/13/2022 00:18   MR ANGIO HEAD WO CONTRAST  Result Date: 12/13/2022 CLINICAL DATA:  Transient ischemic attack EXAM: MRI HEAD WITHOUT CONTRAST MRA HEAD WITHOUT CONTRAST TECHNIQUE: Multiplanar, multi-echo pulse sequences of the brain and surrounding structures were acquired without intravenous contrast. Angiographic images of the Circle of Willis were acquired using MRA technique without intravenous contrast. COMPARISON:  None Available. FINDINGS: MRI HEAD FINDINGS Brain: No acute infarct, mass effect or extra-axial collection. No chronic microhemorrhage or siderosis. There is multifocal hyperintense T2-weighted signal within the white matter. Generalized volume loss. The midline structures are normal. Vascular: Normal flow voids. Skull and upper cervical spine: Normal marrow signal. Sinuses/Orbits: No acute or significant finding. Other: None. MRA HEAD FINDINGS POSTERIOR CIRCULATION: --Vertebral arteries: Normal --Inferior cerebellar arteries: Normal. --Basilar artery: Normal. --Superior cerebellar arteries: Normal. --Posterior cerebral arteries: Normal. Fetal predominant origin of the right PCA. ANTERIOR CIRCULATION: --Intracranial internal carotid arteries: Normal. --Anterior cerebral arteries (ACA): Normal. --Middle cerebral arteries (MCA): Normal. IMPRESSION: 1. No acute intracranial abnormality. 2. Normal intracranial MRA. 3. Chronic small vessel ischemic disease and volume loss. Electronically Signed   By: Ulyses Jarred M.D.   On: 12/13/2022 00:18   CT Head Wo Contrast  Result Date: 12/12/2022 CLINICAL DATA:  Altered mental status, word finding difficulty EXAM: CT HEAD WITHOUT CONTRAST TECHNIQUE: Contiguous axial images were obtained from the base of the skull through the vertex without intravenous contrast.  RADIATION DOSE REDUCTION: This exam was performed according to the departmental dose-optimization program which includes automated exposure control, adjustment of the mA and/or kV according to patient size and/or use of iterative reconstruction technique. COMPARISON:  None Available. FINDINGS: Brain: No evidence of acute infarction, hemorrhage, hydrocephalus, extra-axial collection or mass lesion/mass effect. Periventricular and deep white matter hypodensity. Vascular: No hyperdense vessel or unexpected calcification. Skull: Normal. Negative for fracture or focal lesion. Sinuses/Orbits: No acute finding. Other: None. IMPRESSION: No acute intracranial pathology. Advanced small-vessel white matter disease. Electronically Signed   By: Delanna Ahmadi M.D.   On: 12/12/2022 13:39    PHYSICAL EXAM  Temp:  [97.8 F (36.6 C)-98.7 F (37.1 C)] 97.8 F (36.6 C) (03/31 0748) Pulse Rate:  [64-85] 66 (03/31 0748) Resp:  [17-23] 18 (03/31 0748) BP: (110-169)/(48-112) 133/66 (03/31 0748) SpO2:  [95 %-100 %] 98 % (03/31 0748) Weight:  [52.6 kg-54.3 kg] 54.3 kg (03/30 1659)  General - Well nourished, well developed, in no apparent distress. Cardiovascular - Regular rhythm and rate.  Mental Status -  Level of arousal and orientation to time, place, and person were intact. Language including expression, naming, repetition, comprehension was assessed and found intact. Attention span and concentration were normal. Recent and remote memory were intact. Fund of Knowledge was assessed and was intact.  Cranial Nerves II - XII - II - Visual field intact OU. III, IV, VI - Extraocular movements intact. V - Facial sensation intact bilaterally. VII - Facial movement intact bilaterally. VIII - Hearing & vestibular intact bilaterally.  X - Palate elevates symmetrically. XI - Chin turning & shoulder shrug intact bilaterally. XII - Tongue protrusion intact.  Motor Strength - The patient's strength was normal in all  extremities and pronator drift was absent.  Bulk was normal and fasciculations were absent.   Motor Tone - Muscle tone was assessed at the neck and appendages and was normal. Sensory - Light touch, temperature/pinprick were assessed and were symmetrical.    Coordination - The patient had normal movements in the hands and feet with no ataxia or dysmetria.  Tremor was absent.  Gait and Station - deferred.  ASSESSMENT/PLAN Molly Williams is a 89 y.o. female with history of HTN, HLD, Migraine, lupus, GERD, macular degeneration, hypothyroidism, Vit B12 deficiency, Vit D deficiency who presents with expressive aphasia, symptoms resolved prior to arrival.  Migraine aura vs. TIA CT head No acute abnormality. Small vessel disease    MRI  no acute process  MRA head and neck normal  2D Echo EF greater than 75%, left ventricle grade 1 diastolic dysfunction LDL 98 HgbA1c 5.7 VTE prophylaxis - lovenox No antithrombotic prior to admission, now on aspirin 81 mg daily and clopidogrel 75 mg daily for 3 weeks than ASA alone for monotherapy  Disposition: home  Hx of migraine aura Pt stated that she never had headache before, but she had episodes of visual aura, not able to see half of the visual field. This was about 15 years ago, it happened about once a year. For the last 15 years, she had very rare events like that. No specific triggers This time she again reported no HA.   Hypertension Home meds:  none Stable Long-term BP goal normotensive  Hyperlipidemia Home meds:  none LDL 98, goal < 70 Add atorvastatin 40mg    Continue statin at discharge  Other Stroke Risk Factors Advanced Age >/= 48   Other Active Problems Hypothyroidism GERD  Neurology will sign off.  Please call with questions or concerns  Hospital day # 0  Beulah Gandy DNP, ACNPC-AG  [facility]  ATTENDING NOTE: I reviewed above note and agree with the assessment and plan. Pt was seen and examined.   Pt  sitting at the edge of bed, dressed up for discharge. Neuro intact. She stated that when she was in [place] 15 years ago, she had episodes of visual aura, not able to see half of the visual field. It may occur about once a year. For the last 15 years, she had very rare events like that. No specific triggers. This time she had speech difficulty and no HA either. Lasted for hours and resolved on its own. Stroke work up all negative. Symptoms could be again migraine aura vs. TIA. Pt does have family hx of ICH and stroke. Will treat with DAPT for 3 weeks and then ASA alone. Continue lipitor 40. She will follow up with Clarise Cruz NP at [facility] in 4 weeks.   For detailed assessment and plan, please refer to above/below as I have made changes wherever appropriate.   Neurology will sign off. Please call with questions. Pt will follow up with Sharene Butters NP at [facility] in about 4 weeks. Thanks for the consult.   Rosalin Hawking, MD PhD Stroke Neurology 12/13/2022 1:17 PM    To contact Stroke Continuity provider, please refer to [email or web address] After hours, contact General Neurology

## 2022-12-13 NOTE — Assessment & Plan Note (Signed)
Euvolemic.  Incidentally noted has grade 1 diastolic dysfunction on echocardiogram

## 2022-12-13 NOTE — TOC Transition Note (Signed)
Transition of Care [facility]) - CM/SW Discharge Note   Patient Details  Name: Molly Williams MRN: [id number] Date of Birth: 12/19/1930  Transition of Care [facility]) CM/SW Contact:  Pollie Friar, RN Phone Number: 12/13/2022, 1:01 PM   Clinical Narrative:    Pt is discharging home with self care. No needs per TOC.   Final next level of care: Home/Self Care Barriers to Discharge: No Barriers Identified   Patient Goals and CMS Choice      Discharge Placement                         Discharge Plan and Services Additional resources added to the After Visit Summary for                                       Social Determinants of Health (SDOH) Interventions SDOH Screenings   Food Insecurity: No Food Insecurity (12/13/2022)  Housing: Low Risk  (12/13/2022)  Transportation Needs: No Transportation Needs (12/13/2022)  Utilities: Not At Risk (12/13/2022)  Alcohol Screen: Low Risk  (03/30/2022)  Depression (PHQ2-9): Low Risk  (03/30/2022)  Financial Resource Strain: Low Risk  (03/30/2022)  Physical Activity: Inactive (03/30/2022)  Social Connections: Moderately Integrated (03/30/2022)  Stress: No Stress Concern Present (03/30/2022)  Tobacco Use: Medium Risk (12/12/2022)     Readmission Risk Interventions     No data to display

## 2022-12-13 NOTE — Progress Notes (Signed)
Pt transported off the floor to MRI in stable condition.

## 2022-12-13 NOTE — Evaluation (Signed)
Physical Therapy Evaluation Patient Details Name: Molly Williams MRN: [id number] DOB: [birth year] Today's Date: 12/13/2022  History of Present Illness  Pt is an 89 y.o. female presenting [facility] 3/30 with expressive aphasia that lasted a few hours. Transported to [facility] for TIA workup. MRI unrevealing. PMH significant for hypothyroidism, B12 deficiencey, HLD, migraines.   Clinical Impression  Pt in bed upon arrival of PT, agreeable to evaluation at this time. Prior to admission the pt was completely independent with all mobility without need for DME and reports no recent falls. Pt ambulating with good independence in room upon arrival, reports no change in balance or strength as well as resolution of word-finding issues. The pt was able to demo good hallway ambulation and scored 20/24 on DGI. No follow up therapy recommended at this time as pt is close to baseline mobility. Education on signs and sx of stroke reviewed with pt and her family with all questions answered.   Dynamic Gait Index (DGI): 20/24 (<19 indicates increased risk for falls)     Recommendations for follow up therapy are one component of a multi-disciplinary discharge planning process, led by the attending physician.  Recommendations may be updated based on patient status, additional functional criteria and insurance authorization.  Follow Up Recommendations       Assistance Recommended at Discharge None  Patient can return home with the following  Assistance with cooking/housework;Assist for transportation;Help with stairs or ramp for entrance    Equipment Recommendations None recommended by PT  Recommendations for Other Services       Functional Status Assessment Patient has not had a recent decline in their functional status     Precautions / Restrictions Precautions Precautions: None Restrictions Weight Bearing Restrictions: No      Mobility  Bed Mobility               General bed mobility comments: in  arm chair on arrival and standing nexty to it with daughter to discharge on departure    Transfers Overall transfer level: Modified independent Equipment used: None               General transfer comment: no AD    Ambulation/Gait Ambulation/Gait assistance: Min guard Gait Distance (Feet): 200 Feet Assistive device: None Gait Pattern/deviations: Step-through pattern, Staggering right Gait velocity: 0.63 m/s Gait velocity interpretation: 1.31 - 2.62 ft/sec, indicative of limited community ambulator   General Gait Details: x2 instances of staggering to R with challenge. no assist given to recover. pt reports speed is close to baseline  Financial trader Rankin (Stroke Patients Only) Modified Rankin (Stroke Patients Only) Pre-Morbid Rankin Score: No significant disability Modified Rankin: Moderate disability     Balance Overall balance assessment: Mild deficits observed, not formally tested                               Standardized Balance Assessment Standardized Balance Assessment : Dynamic Gait Index   Dynamic Gait Index Level Surface: Normal Change in Gait Speed: Normal Gait with Horizontal Head Turns: Mild Impairment Gait with Vertical Head Turns: Mild Impairment Gait and Pivot Turn: Normal Step Over Obstacle: Mild Impairment Step Around Obstacles: Normal Steps: Mild Impairment Total Score: 20       Pertinent Vitals/Pain      Home Living Family/patient expects to be discharged to:: Private residence Living Arrangements:  Alone Available Help at Discharge: Family Type of Home: Other(Comment) (town home) Home Access: Level entry       Home Layout: One level Home Equipment: Civil engineer, contracting (Has a workout machine)      Prior Function Prior Level of Function : Independent/Modified Independent             Mobility Comments: no AD ADLs Comments: indep in ADL, IADL, driving     Hand Dominance    Dominant Hand: Right    Extremity/Trunk Assessment   Upper Extremity Assessment Upper Extremity Assessment: Defer to OT evaluation    Lower Extremity Assessment Lower Extremity Assessment: Generalized weakness (appropriate for age, no focal deficits)    Cervical / Trunk Assessment Cervical / Trunk Assessment: Normal  Communication   Communication: No difficulties;HOH  Cognition Arousal/Alertness: Awake/alert Behavior During Therapy: WFL for tasks assessed/performed Overall Cognitive Status: Within Functional Limits for tasks assessed                                 General Comments: some decresaed carryover, but suspect due to hard of hearing        General Comments General comments (skin integrity, edema, etc.): VSS, pt educated on stroke signs and sx    Exercises     Assessment/Plan    PT Assessment Patient does not need any further PT services  PT Problem List         PT Treatment Interventions      PT Goals (Current goals can be found in the Care Plan section)  Acute Rehab PT Goals Patient Stated Goal: return home PT Goal Formulation: With patient Time For Goal Achievement: 12/27/22 Potential to Achieve Goals: Good    Frequency       Co-evaluation               AM-PAC PT "6 Clicks" Mobility  Outcome Measure Help needed turning from your back to your side while in a flat bed without using bedrails?: None Help needed moving from lying on your back to sitting on the side of a flat bed without using bedrails?: None Help needed moving to and from a bed to a chair (including a wheelchair)?: None Help needed standing up from a chair using your arms (e.g., wheelchair or bedside chair)?: None Help needed to walk in hospital room?: A Little Help needed climbing 3-5 steps with a railing? : A Little 6 Click Score: 22    End of Session   Activity Tolerance: Patient tolerated treatment well Patient left: in chair;with call bell/phone within  reach;with family/visitor present Nurse Communication: Mobility status PT Visit Diagnosis: Unsteadiness on feet (R26.81)    Time: [id number] PT Time Calculation (min) (ACUTE ONLY): 14 min   Charges:   PT Evaluation $PT Eval Low Complexity: 1 Low          West Carbo, PT, DPT   Acute Rehabilitation Department Office [phone] Secure Chat Communication Preferred   Sandra Cockayne 12/13/2022, 1:40 PM

## 2022-12-13 NOTE — Consult Note (Signed)
NEUROLOGY CONSULTATION NOTE   Date of service: December 13, 2022 Patient Name: Molly Williams MRN:  [id number] DOB:  1931/03/18 Reason for consult: "aphasia" Requesting Provider: Core, Chriss Driver, MD _ _ _   _ __   _ __ _ _  __ __   _ __   __ _  History of Present Illness  Molly Williams is a 89 y.o. female with PMH significant for hypothyroidism, B12 deficiency who presents with episode of expressive aphasia. Yesterday AM around 11AM, was up and going about her day. Lasted few hours. Neighbor noted speech problems and called daughter but unable to talk to daughter. Symptoms resolving by the time daughter arrived. They still decided to come to the ED.  Initially presented to [facility] and transferred to [facility] for stroke workup.  LKW: 1100 on 12/12/22 mRS: 1 tNKASE: not offered, resolution of symptoms. Thrombectomy: not offered, resolution of symptoms. NIHSS components Score: Comment  1a Level of Conscious 0[x]  1[]  2[]  3[]      1b LOC Questions 0[x]  1[]  2[]       1c LOC Commands 0[x]  1[]  2[]       2 Best Gaze 0[x]  1[]  2[]       3 Visual 0[x]  1[]  2[]  3[]      4 Facial Palsy 0[x]  1[]  2[]  3[]      5a Motor Arm - left 0[x]  1[]  2[]  3[]  4[]  UN[]    5b Motor Arm - Right 0[x]  1[]  2[]  3[]  4[]  UN[]    6a Motor Leg - Left 0[x]  1[]  2[]  3[]  4[]  UN[]    6b Motor Leg - Right 0[x]  1[]  2[]  3[]  4[]  UN[]    7 Limb Ataxia 0[x]  1[]  2[]  3[]  UN[]     8 Sensory 0[x]  1[]  2[]  UN[]      9 Best Language 0[x]  1[]  2[]  3[]      10 Dysarthria 0[x]  1[]  2[]  UN[]      11 Extinct. and Inattention 0[x]  1[]  2[]       TOTAL: 0       ROS   Constitutional Denies weight loss, fever and chills.   HEENT Denies changes in vision and hearing.   Respiratory Denies SOB and cough.   CV Denies palpitations and CP   GI Denies abdominal pain, nausea, vomiting and diarrhea.   GU Denies dysuria and urinary frequency.   MSK Denies myalgia and joint pain.   Skin Denies rash and pruritus.   Neurological Denies headache and syncope.   Psychiatric  Denies recent changes in mood. Denies anxiety and depression.    Past History   Past Medical History:  Diagnosis Date   Anemia 03/29/2015   Arthritis    Atrophic vaginitis    Cholelithiasis 2008   Fibrocystic breast disease    bx 2008 neg per pt report   GERD (gastroesophageal reflux disease)    Hearing loss    Hearing loss sensory, bilateral    bil/hearing aids   Hematuria    chronic Dr. Burnell Blanks   Hyperlipidemia 11/17/2016   Lupus (systemic lupus erythematosus) ([place])    Dr. Charlestine Night   Macular degeneration 11/17/2016   Migraine aura without headache    h/o ocular migraines   Osteopenia    Preventative health care 11/18/2016   Renal insufficiency 03/29/2015   Retinal detachment with break    right/laser   Retinal tear of right eye 11/17/2016   Skin lesion of face 01/09/2016   Spinal stenosis    Thyroid disease    hypothyroidism   Urinary tract bacterial infections    has  had urethral dilations in past   Vitamin B12 deficiency 01/09/2016   Vitamin D deficiency 07/11/2015   Past Surgical History:  Procedure Laterality Date   APPENDECTOMY  2006   CATARACT EXTRACTION W/ INTRAOCULAR LENS  IMPLANT, BILATERAL     CHOLECYSTECTOMY     EYE SURGERY Bilateral    cataracts   TONSILLECTOMY  1940   VAGINAL HYSTERECTOMY     partial   Family History  Problem Relation Age of Onset   Stroke Paternal Grandmother    Stroke Father    Coronary artery disease Father    Coronary artery disease Mother    Stroke Mother    Cancer Paternal Aunt        GYN cancer   Cancer Paternal Uncle        lung, smoker   Stroke Maternal Grandmother    Stroke Maternal Grandfather    Heart disease Paternal Grandfather    Hydrocephalus Brother        normopressure hydrocephalus, dementia   Social History   Socioeconomic History   Marital status: Widowed    Spouse name: Not on file   Number of children: Not on file   Years of education: 13   Highest education level: Not on file  Occupational History    Not on file  Tobacco Use   Smoking status: Former    Types: Cigarettes    Quit date: 1970    Years since quitting: 54.2   Smokeless tobacco: Never  Substance and Sexual Activity   Alcohol use: Yes    Alcohol/week: 10.0 standard drinks of alcohol    Types: 10 Glasses of wine per week   Drug use: No   Sexual activity: Not Currently    Comment: lives alone here because her daughter lives hier, no dietary restrictions, avoids dairy  Other Topics Concern   Not on file  Social History Narrative   Right hand   Caffeine yes   One story home   Social Determinants of Health   Financial Resource Strain: Low Risk  (03/30/2022)   Overall Financial Resource Strain (CARDIA)    Difficulty of Paying Living Expenses: Not hard at all  Food Insecurity: No Food Insecurity (12/13/2022)   Hunger Vital Sign    Worried About Running Out of Food in the Last Year: Never true    Ran Out of Food in the Last Year: Never true  Transportation Needs: No Transportation Needs (12/13/2022)   PRAPARE - Hydrologist (Medical): No    Lack of Transportation (Non-Medical): No  Physical Activity: Inactive (03/30/2022)   Exercise Vital Sign    Days of Exercise per Week: 0 days    Minutes of Exercise per Session: 0 min  Stress: No Stress Concern Present (03/30/2022)   [place]    Feeling of Stress : Not at all  Social Connections: Moderately Integrated (03/30/2022)   Social Connection and Isolation Panel [NHANES]    Frequency of Communication with Friends and Family: More than three times a week    Frequency of Social Gatherings with Friends and Family: More than three times a week    Attends Religious Services: More than 4 times per year    Active Member of [employer] or Organizations: Yes    Attends Archivist Meetings: More than 4 times per year    Marital Status: Widowed   Allergies  Allergen Reactions   Bee  Venom Anaphylaxis  Codeine Nausea And Vomiting and Nausea Only   Iodine Hives    Contrast dye   Alendronate    Bacitracin Swelling    Ophthalmic ointment   Iodinated Contrast Media    Bactrim  [Sulfamethoxazole-Trimethoprim] Hives, Itching and Rash    Medications   Facility-Administered Medications Prior to Admission  Medication Dose Route Frequency Provider Last Rate Last Admin   cyanocobalamin (VITAMIN B12) injection 1,000 mcg  1,000 mcg Intramuscular Q30 days Mosie Lukes, MD   1,000 mcg at 07/22/22 1008   Medications Prior to Admission  Medication Sig Dispense Refill Last Dose   albuterol (VENTOLIN HFA) 108 (90 Base) MCG/ACT inhaler Inhale 2 puffs into the lungs every 6 (six) hours as needed for wheezing or shortness of breath. 18 g 3    Cholecalciferol (VITAMIN D) 2000 units CAPS Take 2,000 Units by mouth daily.      levothyroxine (SYNTHROID) 25 MCG tablet TAKE ONE TABLET BY MOUTH ONE TIME DAILY EXCEPT ON TUESDAYS AND SATURDAYS TAKE 2 TABS 108 tablet 0    omeprazole (PRILOSEC) 20 MG capsule Take 1 capsule (20 mg total) by mouth daily. 30 capsule 0    traZODone (DESYREL) 50 MG tablet TAKE 1/2 TO 1 (ONE-HALF TO ONE) TABLET BY MOUTH AT BEDTIME AS NEEDED FOR SLEEP 30 tablet 0      Vitals   Vitals:   12/12/22 1603 12/12/22 1659 12/12/22 1947 12/13/22 0322  BP:  (!) 156/65 (!) 150/112 (!) 110/48  Pulse:  69 80 64  Resp:   17 17  Temp: 98.4 F (36.9 C) 98.6 F (37 C) 98.4 F (36.9 C) 98.4 F (36.9 C)  TempSrc: Oral Oral Oral Oral  SpO2:  99% 95% 97%  Weight:  54.3 kg    Height:  5\' 2"  (1.575 m)       Body mass index is 21.9 kg/m.  Physical Exam   General: Laying comfortably in bed; in no acute distress. HENT: Normal oropharynx and mucosa. Normal external appearance of ears and nose.  Neck: Supple, no pain or tenderness  CV: No JVD. No peripheral edema.  Pulmonary: Symmetric Chest rise. Normal respiratory effort.  Abdomen: Soft to touch, non-tender.  Ext: No  cyanosis, edema, or deformity  Skin: No rash. Normal palpation of skin.   Musculoskeletal: Normal digits and nails by inspection. No clubbing.   Neurologic Examination  Mental status/Cognition: Alert, oriented to self, place, month and year, good attention. Speech/language: Fluent, comprehension intact, object naming intact, repetition intact.  Cranial nerves:   CN II Pupils equal and reactive to light, no VF deficits    CN III,IV,VI EOM intact, no gaze preference or deviation, no nystagmus    CN V normal sensation in V1, V2, and V3 segments bilaterally    CN VII no asymmetry, no nasolabial fold flattening    CN VIII normal hearing to speech    CN IX & X normal palatal elevation, no uvular deviation    CN XI 5/5 head turn and 5/5 shoulder shrug bilaterally    CN XII midline tongue protrusion   Motor:  Muscle bulk: normal, tone normal, pronator drift none tremor none Mvmt Root Nerve  Muscle Right Left Comments  SA C5/6 Ax Deltoid 5 5   EF C5/6 Mc Biceps 5 5   EE C6/7/8 Rad Triceps 5 5   WF C6/7 Med FCR     WE C7/8 PIN ECU     F Ab C8/T1 U ADM/FDI 5 5   HF L1/2/3  Fem Illopsoas 5 5   KE L2/3/4 Fem Quad 5 5   DF L4/5 D Peron Tib Ant 5 5   PF S1/2 Tibial Grc/Sol 5 5    Sensation:  Light touch Intact throughout   Pin prick    Temperature    Vibration   Proprioception    Coordination/Complex Motor:  - Finger to Nose intact BL - Heel to shin are normal - Rapid alternating movement are normal - Gait: deferred.  Labs   CBC:  Recent Labs  Lab 12/12/22 1336  WBC 6.2  NEUTROABS 4.4  HGB 12.5  HCT 36.9  MCV 95.8  PLT [id number]    Basic Metabolic Panel:  Lab Results  Component Value Date   NA 132 (L) 12/12/2022   K 3.7 12/12/2022   CO2 22 12/12/2022   GLUCOSE 90 12/12/2022   BUN 15 12/12/2022   CREATININE 0.99 12/12/2022   CALCIUM 8.6 (L) 12/12/2022   GFRNONAA 55 (L) 12/12/2022   GFRAA 88 (L) 09/22/2014   Lipid Panel:  Lab Results  Component Value Date   LDLCALC  98 12/12/2022   HgbA1c:  Lab Results  Component Value Date   HGBA1C 5.7 09/29/2021   Urine Drug Screen:     Component Value Date/Time   LABOPIA NONE DETECTED 12/12/2022 1431   COCAINSCRNUR NONE DETECTED 12/12/2022 1431   LABBENZ NONE DETECTED 12/12/2022 1431   AMPHETMU NONE DETECTED 12/12/2022 1431   THCU NONE DETECTED 12/12/2022 1431   LABBARB NONE DETECTED 12/12/2022 1431    Alcohol Level     Component Value Date/Time   ETH <10 12/12/2022 1336    CT Head without contrast(Personally reviewed): CTH was negative for a large hypodensity concerning for a large territory infarct or hyperdensity concerning for an ICH   MR angio Head and Neck without contrast(Personally reviewed): No LVO, no stenosis  MRI Brain(Personally reviewed): No acute abnormality  Impression   Molly Williams is a 89 y.o. female with PMH significant for hypothyroidism, B12 deficiency who presents with episode of expressive aphasia that spontaneously resolved.  Description of episode concerning for TIA.  Recommendations   - Frequent Neuro checks per stroke unit protocol - Recommend obtaining TTE  - Recommend obtaining Lipid panel with LDL - Please start statin if LDL > 70 - Recommend HbA1c to evaluate for diabetes and how well it is controlled. - Antithrombotic - Aspirin 81mg  daily along with plavix 75mg  daily x 21days, followed by Aspirin 81mg  daily alone. - Recommend DVT ppx - SBP goal - permissive hypertension first 24 h < 220/110. Held home meds.  - Recommend Telemetry monitoring for arrythmia - Recommend bedside swallow screen prior to PO intake. - Stroke education booklet - Recommend PT/OT/SLP consult  ______________________________________________________________________   Thank you for the opportunity to take part in the care of this patient. If you have any further questions, please contact the neurology consultation attending.  Signed,  [place] Pager Number [phone] _ _ _   _ __   _ __ _ _  __ __   _ __   __ _

## 2022-12-13 NOTE — Assessment & Plan Note (Signed)
Started on Lipitor.  As below, LDL at 98 with target below 70

## 2022-12-13 NOTE — Evaluation (Signed)
Occupational Therapy Evaluation Patient Details Name: Molly Williams MRN: [id number] DOB: June 26, 1931 Today's Date: 12/13/2022   History of Present Illness Pt is an 89 y.o. female presenting to [facility] 3/30 with expressive aphasia that lasted a few hours. Transported to [facility] for TIA workup. MRI unrevealing. PMH significant for hypothyroidism, B12 deficiencey, HLD, migraines.   Clinical Impression   Pt finishing getting dressed on arrival. Up mobilizing with mod I with daughter present in room. Pt with fair carryover of information provided at hospital this admission and very interested in learning the signs and symptoms of a stroke, stating "I plan to read my whole stroke booklet from cover to cover". Pt observed with mild balance deficits during session. Believe this pt is at her functional baseline. Thus, recommending discharge home with no follow up OT.      Recommendations for follow up therapy are one component of a multi-disciplinary discharge planning process, led by the attending physician.  Recommendations may be updated based on patient status, additional functional criteria and insurance authorization.   Assistance Recommended at Discharge PRN  Patient can return home with the following  (on request)    Functional Status Assessment  Patient has had a recent decline in their functional status and demonstrates the ability to make significant improvements in function in a reasonable and predictable amount of time.  Equipment Recommendations  None recommended by OT    Recommendations for Other Services       Precautions / Restrictions        Mobility Bed Mobility               General bed mobility comments: in arm chair on arrival and standing nexty to it with daughter to discharge on departure    Transfers Overall transfer level: Modified independent Equipment used: None               General transfer comment: no AD      Balance Overall balance assessment:  Mild deficits observed, not formally tested                                         ADL either performed or assessed with clinical judgement   ADL Overall ADL's : Modified independent                                             Vision Baseline Vision/History: 0 No visual deficits Ability to See in Adequate Light: 0 Adequate Patient Visual Report: No change from baseline Vision Assessment?: No apparent visual deficits Additional Comments: wears reading glasses     Perception Perception Perception Tested?: No   Praxis Praxis Praxis tested?: Within functional limits    Pertinent Vitals/Pain Pain Assessment Pain Assessment: No/denies pain     Hand Dominance Right   Extremity/Trunk Assessment Upper Extremity Assessment Upper Extremity Assessment: Overall WFL for tasks assessed   Lower Extremity Assessment Lower Extremity Assessment: Defer to PT evaluation   Cervical / Trunk Assessment Cervical / Trunk Assessment: Normal   Communication Communication Communication: No difficulties;HOH   Cognition Arousal/Alertness: Awake/alert Behavior During Therapy: WFL for tasks assessed/performed Overall Cognitive Status: Within Functional Limits for tasks assessed  General Comments: some decresaed carryover, but suspect due to hard of hearing     General Comments  VSS    Exercises     Shoulder Instructions      Home Living Family/patient expects to be discharged to:: Private residence Living Arrangements: Alone Available Help at Discharge: Family Type of Home: Other(Comment) (town home) Home Access: Level entry     Home Layout: One level     Bathroom Shower/Tub: Occupational psychologist: Handicapped height     [place]: Civil engineer, contracting (Has a workout machine)      Lives With: Alone    Prior Functioning/Environment Prior Level of Function : Independent/Modified  Independent             Mobility Comments: no AD ADLs Comments: indep in ADL, IADL, driving        OT Problem List: Decreased strength;Decreased activity tolerance      OT Treatment/Interventions:      OT Goals(Current goals can be found in the care plan section) Acute Rehab OT Goals Patient Stated Goal: go home OT Goal Formulation: With patient  OT Frequency:      Co-evaluation              AM-PAC OT "6 Clicks" Daily Activity     Outcome Measure Help from another person eating meals?: None Help from another person taking care of personal grooming?: None Help from another person toileting, which includes using toliet, bedpan, or urinal?: None Help from another person bathing (including washing, rinsing, drying)?: None Help from another person to put on and taking off regular upper body clothing?: None Help from another person to put on and taking off regular lower body clothing?: None 6 Click Score: 24   End of Session Nurse Communication: Mobility status  Activity Tolerance: Patient tolerated treatment well Patient left: Other (comment) (Up in room with daughter)  OT Visit Diagnosis: Unsteadiness on feet (R26.81)                Time: [id number] OT Time Calculation (min): 17 min Charges:  OT General Charges $OT Visit: 1 Visit OT Evaluation $OT Eval Low Complexity: 1 Low  Elder Cyphers, OTR/L [facility] Acute Rehabilitation Office: [phone]   Magnus Ivan 12/13/2022, 12:53 PM

## 2022-12-13 NOTE — Progress Notes (Signed)
  Echocardiogram 2D Echocardiogram has been performed.  Molly Williams 12/13/2022, 8:44 AM

## 2022-12-14 ENCOUNTER — Telehealth: Payer: Self-pay

## 2022-12-14 ENCOUNTER — Other Ambulatory Visit: Payer: Self-pay | Admitting: Family Medicine

## 2022-12-14 ENCOUNTER — Telehealth: Payer: Self-pay | Admitting: Physician Assistant

## 2022-12-14 LAB — HEMOGLOBIN A1C
Hgb A1c MFr Bld: 5.7 % — ABNORMAL HIGH (ref 4.8–5.6)
Mean Plasma Glucose: 117 mg/dL

## 2022-12-14 NOTE — Telephone Encounter (Signed)
Pt called in stating she was returning a call from our office. I didn't see any notes that we had called. I went ahead and scheduled a follow up for 01/14/23 since she cancelled her 1 year appointment for 12/03/22.

## 2022-12-14 NOTE — Transitions of Care (Post Inpatient/ED Visit) (Signed)
   12/14/2022  Name: Molly Williams MRN: [id number] DOB: [birth year]  Today's TOC FU Call Status: Today's TOC FU Call Status:: Successful TOC FU Call Competed TOC FU Call Complete Date: 12/14/22  Transition Care Management Follow-up Telephone Call Date of Discharge: 12/13/22 Discharge Facility: [facility]) Type of Discharge: Inpatient Admission Primary Inpatient Discharge Diagnosis:: TIA How have you been since you were released from the hospital?: Better Any questions or concerns?: No  Items Reviewed: Did you receive and understand the discharge instructions provided?: Yes Medications obtained and verified?: Yes (Medications Reviewed) Any new allergies since your discharge?: No Dietary orders reviewed?: Yes Do you have support at home?: Yes People in Home: child(ren), adult  Home Care and Equipment/Supplies: [place] Ordered?: NA Any new equipment or medical supplies ordered?: NA  Functional Questionnaire: Do you need assistance with meal preparation?: No Do you need assistance with eating?: No Do you have difficulty maintaining continence: No Do you need assistance with getting out of bed/getting out of a chair/moving?: No Do you have difficulty managing or taking your medications?: No  Follow up appointments reviewed: PCP Follow-up appointment confirmed?: [facility] Follow-up appointment confirmed?: No Reason Specialist Follow-Up Not Confirmed: Patient has Specialist Provider Number and will Call for Appointment Do you need transportation to your follow-up appointment?: No Do you understand care options if your condition(s) worsen?: Yes-patient verbalized understanding  Patient declined appt at this time  [name], [place] [phone]

## 2022-12-20 LAB — METHYLMALONIC ACID, SERUM: Methylmalonic Acid, Quantitative: 128 nmol/L (ref 0–378)

## 2023-01-14 ENCOUNTER — Ambulatory Visit: Payer: Medicare Other | Admitting: Physician Assistant

## 2023-01-19 ENCOUNTER — Encounter: Payer: Self-pay | Admitting: Family Medicine

## 2023-01-19 ENCOUNTER — Ambulatory Visit (INDEPENDENT_AMBULATORY_CARE_PROVIDER_SITE_OTHER): Payer: Medicare Other | Admitting: Family Medicine

## 2023-01-19 VITALS — BP 98/75 | HR 65 | Ht 62.0 in | Wt 118.0 lb

## 2023-01-19 DIAGNOSIS — R21 Rash and other nonspecific skin eruption: Secondary | ICD-10-CM | POA: Diagnosis not present

## 2023-01-19 NOTE — Progress Notes (Signed)
   Acute Office Visit  Subjective:     Patient ID: Molly Williams, female    DOB: 1930-12-08, 89 y.o.   MRN: [id number]  Chief Complaint  Patient presents with   Insect Bite    HPI Patient is in today for possible spider bite. She is here with her daughter.  Patient states that on Sunday afternoon she noticed some mild discomfort to her right lower arm with some swelling. States she had been sitting in her chair by the window and thinks a spider may have bitten her. On Monday, she noticed worsening redness so they scheduled this appointment. Today she reports the area is looking significantly better; still mild erythema and inflammation, but no pain, itching, drainage, fevers, chills, body aches, malaise. She put a little hydrocortisone cream and cleaned with peroxide.      ROS All review of systems negative except what is listed in the HPI      Objective:    BP 98/75   Pulse 65   Ht 5\' 2"  (1.575 m)   Wt 118 lb (53.5 kg)   SpO2 98%   BMI 21.58 kg/m     Physical Exam Vitals reviewed.  Constitutional:      Appearance: Normal appearance.  Skin:    Comments: Mild erythema to right lower arm (see picture), no warmth, fluctuance, induration, open lesions, drainage, tenderness   Neurological:     Mental Status: She is alert.  Psychiatric:        Mood and Affect: Mood normal.        Behavior: Behavior normal.        Thought Content: Thought content normal.        Judgment: Judgment normal.     No results found for any visits on 01/19/23.      Assessment & Plan:   Problem List Items Addressed This Visit   None Visit Diagnoses     Rash    -  Primary Improvement compared to pictures from yesterday and Sunday. Only mild erythema today with no systemic symptoms. Recommend continue supportive measures and monitoring. Patient aware of signs/symptoms requiring further/urgent evaluation.         No orders of the defined types were placed in this  encounter.   Return if symptoms worsen or fail to improve.  Clayborne Dana, NP

## 2023-01-21 ENCOUNTER — Other Ambulatory Visit: Payer: Self-pay | Admitting: Family Medicine

## 2023-03-08 ENCOUNTER — Other Ambulatory Visit: Payer: Self-pay | Admitting: Family Medicine

## 2023-03-08 DIAGNOSIS — E079 Disorder of thyroid, unspecified: Secondary | ICD-10-CM

## 2023-03-31 ENCOUNTER — Telehealth: Payer: Self-pay | Admitting: Family Medicine

## 2023-03-31 NOTE — Telephone Encounter (Signed)
Copied from CRM [id number]. Topic: Medicare AWV >> Mar 31, 2023  2:46 PM Payton Doughty wrote: Reason for CRM: LM 03/31/2023 to schedule AWV   Verlee Rossetti; Care Guide Ambulatory Clinical Support [place] [facility] Direct Dial: [phone]

## 2023-04-06 ENCOUNTER — Ambulatory Visit (INDEPENDENT_AMBULATORY_CARE_PROVIDER_SITE_OTHER): Payer: Medicare Other | Admitting: Family Medicine

## 2023-04-06 VITALS — BP 116/72 | HR 80 | Temp 97.8°F | Resp 16 | Ht 62.0 in | Wt 119.6 lb

## 2023-04-06 DIAGNOSIS — E538 Deficiency of other specified B group vitamins: Secondary | ICD-10-CM

## 2023-04-06 DIAGNOSIS — E559 Vitamin D deficiency, unspecified: Secondary | ICD-10-CM

## 2023-04-06 DIAGNOSIS — I5032 Chronic diastolic (congestive) heart failure: Secondary | ICD-10-CM

## 2023-04-06 DIAGNOSIS — K219 Gastro-esophageal reflux disease without esophagitis: Secondary | ICD-10-CM

## 2023-04-06 DIAGNOSIS — E039 Hypothyroidism, unspecified: Secondary | ICD-10-CM

## 2023-04-06 DIAGNOSIS — N183 Chronic kidney disease, stage 3 unspecified: Secondary | ICD-10-CM

## 2023-04-06 DIAGNOSIS — I10 Essential (primary) hypertension: Secondary | ICD-10-CM

## 2023-04-06 DIAGNOSIS — M810 Age-related osteoporosis without current pathological fracture: Secondary | ICD-10-CM

## 2023-04-06 DIAGNOSIS — E782 Mixed hyperlipidemia: Secondary | ICD-10-CM

## 2023-04-06 DIAGNOSIS — R739 Hyperglycemia, unspecified: Secondary | ICD-10-CM

## 2023-04-06 DIAGNOSIS — M329 Systemic lupus erythematosus, unspecified: Secondary | ICD-10-CM

## 2023-04-06 DIAGNOSIS — R252 Cramp and spasm: Secondary | ICD-10-CM

## 2023-04-06 NOTE — Assessment & Plan Note (Signed)
Well controlled, no changes to meds. Encouraged heart healthy diet such as the DASH diet and exercise as tolerated.  °

## 2023-04-06 NOTE — Assessment & Plan Note (Signed)
Hydrate and monitor 

## 2023-04-06 NOTE — Assessment & Plan Note (Signed)
Supplement and monitor 

## 2023-04-06 NOTE — Assessment & Plan Note (Signed)
Encouraged to get adequate exercise, calcium and vitamin d intake 

## 2023-04-06 NOTE — Assessment & Plan Note (Signed)
No recent exacerbation 

## 2023-04-06 NOTE — Assessment & Plan Note (Signed)
On Levothyroxine, continue to monitor 

## 2023-04-06 NOTE — Assessment & Plan Note (Signed)
hgba1c acceptable, minimize simple carbs. Increase exercise as tolerated.  

## 2023-04-06 NOTE — Assessment & Plan Note (Signed)
Encourage heart healthy diet such as MIND or DASH diet, increase exercise, avoid trans fats, simple carbohydrates and processed foods, consider a krill or fish or flaxseed oil cap daily.  °

## 2023-04-06 NOTE — Progress Notes (Deleted)
   Subjective:    Patient ID: Molly Williams, female    DOB: Jul 03, 1931, 89 y.o.   MRN: [id number]  Patient not seen

## 2023-04-06 NOTE — Assessment & Plan Note (Signed)
Follows with rheumatology. 

## 2023-04-06 NOTE — Assessment & Plan Note (Signed)
Avoid offending foods, start probiotics. Do not eat large meals in late evening and consider raising head of bed.  

## 2023-04-11 NOTE — Progress Notes (Unsigned)
Subjective:    Patient ID: Molly Williams, female    DOB: Mar 30, 1931, 89 y.o.   MRN: [id number]  No chief complaint on file.   HPI Discussed the use of AI scribe software for clinical note transcription with the patient, who gave verbal consent to proceed.  History of Present Illness   Patient is a 89 female in today for annual preventative exam and for follow up on chronic medical concerns. No recent febrile illness or hospitalizations. Denies CP/palp/SOB/HA/congestion/fevers/GI or GU c/o. Taking meds as prescribed         Past Medical History:  Diagnosis Date   Anemia 03/29/2015   Arthritis    Atrophic vaginitis    Cholelithiasis 2008   Fibrocystic breast disease    bx 2008 neg per pt report   GERD (gastroesophageal reflux disease)    Hearing loss    Hearing loss sensory, bilateral    bil/hearing aids   Hematuria    chronic Dr. Merry Lofty   Hyperlipidemia 11/17/2016   Lupus (systemic lupus erythematosus) (HCC)    Dr. Kellie Simmering   Macular degeneration 11/17/2016   Migraine aura without headache    h/o ocular migraines   Osteopenia    Preventative health care 11/18/2016   Renal insufficiency 03/29/2015   Retinal detachment with break    right/laser   Retinal tear of right eye 11/17/2016   Skin lesion of face 01/09/2016   Spinal stenosis    Thyroid disease    hypothyroidism   Urinary tract bacterial infections    has had urethral dilations in past   Vitamin B12 deficiency 01/09/2016   Vitamin D deficiency 07/11/2015    Past Surgical History:  Procedure Laterality Date   APPENDECTOMY  2006   CATARACT EXTRACTION W/ INTRAOCULAR LENS  IMPLANT, BILATERAL     CHOLECYSTECTOMY     EYE SURGERY Bilateral    cataracts   TONSILLECTOMY  1940   VAGINAL HYSTERECTOMY     partial    Family History  Problem Relation Age of Onset   Stroke Paternal Grandmother    Stroke Father    Coronary artery disease Father    Coronary artery disease Mother    Stroke Mother    Cancer Paternal  Aunt        GYN cancer   Cancer Paternal Uncle        lung, smoker   Stroke Maternal Grandmother    Stroke Maternal Grandfather    Heart disease Paternal Grandfather    Hydrocephalus Brother        normopressure hydrocephalus, dementia    Social History   Socioeconomic History   Marital status: Widowed    Spouse name: Not on file   Number of children: Not on file   Years of education: 13   Highest education level: Not on file  Occupational History   Not on file  Tobacco Use   Smoking status: Former    Current packs/day: 0.00    Types: Cigarettes    Quit date: 1970    Years since quitting: 54.6   Smokeless tobacco: Never  Substance and Sexual Activity   Alcohol use: Yes    Alcohol/week: 10.0 standard drinks of alcohol    Types: 10 Glasses of wine per week   Drug use: No   Sexual activity: Not Currently    Comment: lives alone here because her daughter lives hier, no dietary restrictions, avoids dairy  Other Topics Concern   Not on file  Social History Narrative  Right hand   Caffeine yes   One story home   Social Determinants of Health   Financial Resource Strain: Low Risk  (03/30/2022)   Overall Financial Resource Strain (CARDIA)    Difficulty of Paying Living Expenses: Not hard at all  Food Insecurity: No Food Insecurity (12/13/2022)   Hunger Vital Sign    Worried About Running Out of Food in the Last Year: Never true    Ran Out of Food in the Last Year: Never true  Transportation Needs: No Transportation Needs (12/13/2022)   PRAPARE - Administrator, Civil Service (Medical): No    Lack of Transportation (Non-Medical): No  Physical Activity: Inactive (03/30/2022)   Exercise Vital Sign    Days of Exercise per Week: 0 days    Minutes of Exercise per Session: 0 min  Stress: No Stress Concern Present (03/30/2022)   [employer] of Occupational Health - Occupational Stress Questionnaire    Feeling of Stress : Not at all  Social Connections:  Moderately Integrated (03/30/2022)   Social Connection and Isolation Panel [NHANES]    Frequency of Communication with Friends and Family: More than three times a week    Frequency of Social Gatherings with Friends and Family: More than three times a week    Attends Religious Services: More than 4 times per year    Active Member of [employer] or Organizations: Yes    Attends Banker Meetings: More than 4 times per year    Marital Status: Widowed  Intimate Partner Violence: Not At Risk (12/13/2022)   Humiliation, Afraid, Rape, and Kick questionnaire    Fear of Current or Ex-Partner: No    Emotionally Abused: No    Physically Abused: No    Sexually Abused: No    Outpatient Medications Prior to Visit  Medication Sig Dispense Refill   albuterol (VENTOLIN HFA) 108 (90 Base) MCG/ACT inhaler Inhale 2 puffs into the lungs every 6 (six) hours as needed for wheezing or shortness of breath. 18 g 3   aspirin EC 81 MG tablet Take 1 tablet (81 mg total) by mouth daily. Swallow whole. 30 tablet 12   atorvastatin (LIPITOR) 40 MG tablet Take 1 tablet (40 mg total) by mouth daily. 30 tablet 11   Cholecalciferol (VITAMIN D) 2000 units CAPS Take 2,000 Units by mouth daily.     clopidogrel (PLAVIX) 75 MG tablet Take 1 tablet (75 mg total) by mouth daily. 20 tablet 0   levothyroxine (SYNTHROID) 25 MCG tablet TAKE ONE TABLET BY MOUTH ONE TIME DAILY EXCEPT ON TUESDAYS AND SATURDAYS TAKE TWO TABLETS 108 tablet 0   omeprazole (PRILOSEC) 20 MG capsule Take 1 capsule (20 mg total) by mouth daily. 30 capsule 0   traZODone (DESYREL) 50 MG tablet Take 0.5-1 tablets (25-50 mg total) by mouth at bedtime as needed for sleep. 90 tablet 0   Facility-Administered Medications Prior to Visit  Medication Dose Route Frequency Provider Last Rate Last Admin   cyanocobalamin (VITAMIN B12) injection 1,000 mcg  1,000 mcg Intramuscular Q30 days Bradd Canary, MD   1,000 mcg at 07/22/22 1008    Allergies  Allergen  Reactions   Bee Venom Anaphylaxis   Codeine Nausea And Vomiting and Nausea Only   Iodine Hives    Contrast dye   Alendronate    Bacitracin Swelling    Ophthalmic ointment   Iodinated Contrast Media    Bactrim  [Sulfamethoxazole-Trimethoprim] Hives, Itching and Rash    Review of Systems  Constitutional:  Negative for fever and malaise/fatigue.  HENT:  Negative for congestion.   Eyes:  Negative for blurred vision.  Respiratory:  Negative for shortness of breath.   Cardiovascular:  Negative for chest pain, palpitations and leg swelling.  Gastrointestinal:  Negative for abdominal pain, blood in stool and nausea.  Genitourinary:  Negative for dysuria and frequency.  Musculoskeletal:  Negative for falls.  Skin:  Negative for rash.  Neurological:  Negative for dizziness, loss of consciousness and headaches.  Endo/Heme/Allergies:  Negative for environmental allergies.  Psychiatric/Behavioral:  Negative for depression. The patient is not nervous/anxious.        Objective:    Physical Exam Constitutional:      General: She is not in acute distress.    Appearance: Normal appearance. She is not diaphoretic.  HENT:     Head: Normocephalic and atraumatic.     Right Ear: Tympanic membrane, ear canal and external ear normal.     Left Ear: Tympanic membrane, ear canal and external ear normal.     Nose: Nose normal.     Mouth/Throat:     Mouth: Mucous membranes are moist.     Pharynx: Oropharynx is clear. No oropharyngeal exudate.  Eyes:     General: No scleral icterus.       Right eye: No discharge.        Left eye: No discharge.     Conjunctiva/sclera: Conjunctivae normal.     Pupils: Pupils are equal, round, and reactive to light.  Neck:     Thyroid: No thyromegaly.  Cardiovascular:     Rate and Rhythm: Normal rate and regular rhythm.     Heart sounds: Normal heart sounds. No murmur heard. Pulmonary:     Effort: Pulmonary effort is normal. No respiratory distress.     Breath  sounds: Normal breath sounds. No wheezing or rales.  Abdominal:     General: Bowel sounds are normal. There is no distension.     Palpations: Abdomen is soft. There is no mass.     Tenderness: There is no abdominal tenderness.  Musculoskeletal:        General: No tenderness. Normal range of motion.     Cervical back: Normal range of motion and neck supple.  Lymphadenopathy:     Cervical: No cervical adenopathy.  Skin:    General: Skin is warm and dry.     Findings: No rash.  Neurological:     General: No focal deficit present.     Mental Status: She is alert and oriented to person, place, and time.     Cranial Nerves: No cranial nerve deficit.     Coordination: Coordination normal.     Deep Tendon Reflexes: Reflexes are normal and symmetric. Reflexes normal.  Psychiatric:        Mood and Affect: Mood normal.        Behavior: Behavior normal.        Thought Content: Thought content normal.        Judgment: Judgment normal.     There were no vitals taken for this visit. Wt Readings from Last 3 Encounters:  04/06/23 119 lb 9.6 oz (54.3 kg)  01/19/23 118 lb (53.5 kg)  12/12/22 119 lb 11.4 oz (54.3 kg)    Diabetic Foot Exam - Simple   No data filed    Lab Results  Component Value Date   WBC 6.2 12/12/2022   HGB 12.5 12/12/2022   HCT 36.9 12/12/2022   PLT 287  12/12/2022   GLUCOSE 74 12/13/2022   CHOL 210 (H) 12/12/2022   TRIG 62 12/12/2022   HDL 100 12/12/2022   LDLCALC 98 12/12/2022   ALT 16 12/12/2022   AST 23 12/12/2022   NA 135 12/13/2022   K 3.8 12/13/2022   CL 102 12/13/2022   CREATININE 1.03 (H) 12/13/2022   BUN 11 12/13/2022   CO2 23 12/13/2022   TSH 4.614 (H) 12/12/2022   INR 1.1 12/12/2022   HGBA1C 5.7 (H) 12/12/2022    Lab Results  Component Value Date   TSH 4.614 (H) 12/12/2022   Lab Results  Component Value Date   WBC 6.2 12/12/2022   HGB 12.5 12/12/2022   HCT 36.9 12/12/2022   MCV 95.8 12/12/2022   PLT 287 12/12/2022   Lab Results   Component Value Date   NA 135 12/13/2022   K 3.8 12/13/2022   CO2 23 12/13/2022   GLUCOSE 74 12/13/2022   BUN 11 12/13/2022   CREATININE 1.03 (H) 12/13/2022   BILITOT 0.8 12/12/2022   ALKPHOS 73 12/12/2022   AST 23 12/12/2022   ALT 16 12/12/2022   PROT 7.3 12/12/2022   ALBUMIN 3.9 12/12/2022   CALCIUM 8.9 12/13/2022   ANIONGAP 10 12/13/2022   GFR 49.68 (L) 10/06/2021   Lab Results  Component Value Date   CHOL 210 (H) 12/12/2022   Lab Results  Component Value Date   HDL 100 12/12/2022   Lab Results  Component Value Date   LDLCALC 98 12/12/2022   Lab Results  Component Value Date   TRIG 62 12/12/2022   Lab Results  Component Value Date   CHOLHDL 2.1 12/12/2022   Lab Results  Component Value Date   HGBA1C 5.7 (H) 12/12/2022       Assessment & Plan:  B12 deficiency Assessment & Plan: Supplement and monitor    Stage 3 chronic kidney disease, unspecified whether stage 3a or 3b CKD (HCC) Assessment & Plan: Hydrate and monitor    Chronic diastolic CHF (congestive heart failure) (HCC) Assessment & Plan: No recent exacerbation, no changes   Hypothyroidism, unspecified type Assessment & Plan: On Levothyroxine, continue to monitor   Mixed hyperlipidemia Assessment & Plan: Encourage heart healthy diet such as MIND or DASH diet, increase exercise, avoid trans fats, simple carbohydrates and processed foods, consider a krill or fish or flaxseed oil cap daily.    Preventative health care Assessment & Plan: Patient encouraged to maintain heart healthy diet, regular exercise, adequate sleep. Consider daily probiotics. Take medications as prescribed. Labs ordered and reviewed. Has aged out of colonoscopy, MGM, pap   Vitamin D deficiency Assessment & Plan: Supplement and monitor    Osteoporosis, unspecified osteoporosis type, unspecified pathological fracture presence    Assessment and Plan              Danise Edge, MD

## 2023-04-11 NOTE — Assessment & Plan Note (Signed)
Supplement and monitor 

## 2023-04-11 NOTE — Assessment & Plan Note (Signed)
Patient encouraged to maintain heart healthy diet, regular exercise, adequate sleep. Consider daily probiotics. Take medications as prescribed. Labs ordered and reviewed. Has aged out of colonoscopy, MGM, pap

## 2023-04-11 NOTE — Assessment & Plan Note (Signed)
On Levothyroxine, continue to monitor 

## 2023-04-11 NOTE — Assessment & Plan Note (Signed)
No recent exacerbation, no changes 

## 2023-04-11 NOTE — Progress Notes (Signed)
Patient had an urgent matter to attend to and had to leave.

## 2023-04-11 NOTE — Assessment & Plan Note (Signed)
Encourage heart healthy diet such as MIND or DASH diet, increase exercise, avoid trans fats, simple carbohydrates and processed foods, consider a krill or fish or flaxseed oil cap daily.  °

## 2023-04-11 NOTE — Assessment & Plan Note (Signed)
Hydrate and monitor 

## 2023-04-12 ENCOUNTER — Encounter: Payer: Self-pay | Admitting: Family Medicine

## 2023-04-12 ENCOUNTER — Ambulatory Visit (INDEPENDENT_AMBULATORY_CARE_PROVIDER_SITE_OTHER): Payer: Medicare Other | Admitting: Family Medicine

## 2023-04-12 VITALS — BP 105/62 | HR 71 | Temp 98.8°F | Ht 62.0 in | Wt 119.0 lb

## 2023-04-12 DIAGNOSIS — R739 Hyperglycemia, unspecified: Secondary | ICD-10-CM | POA: Diagnosis not present

## 2023-04-12 DIAGNOSIS — E538 Deficiency of other specified B group vitamins: Secondary | ICD-10-CM | POA: Diagnosis not present

## 2023-04-12 DIAGNOSIS — E782 Mixed hyperlipidemia: Secondary | ICD-10-CM | POA: Diagnosis not present

## 2023-04-12 DIAGNOSIS — E559 Vitamin D deficiency, unspecified: Secondary | ICD-10-CM | POA: Diagnosis not present

## 2023-04-12 DIAGNOSIS — Z0000 Encounter for general adult medical examination without abnormal findings: Secondary | ICD-10-CM | POA: Diagnosis not present

## 2023-04-12 DIAGNOSIS — E039 Hypothyroidism, unspecified: Secondary | ICD-10-CM

## 2023-04-12 DIAGNOSIS — M810 Age-related osteoporosis without current pathological fracture: Secondary | ICD-10-CM | POA: Diagnosis not present

## 2023-04-12 DIAGNOSIS — I5032 Chronic diastolic (congestive) heart failure: Secondary | ICD-10-CM | POA: Diagnosis not present

## 2023-04-12 DIAGNOSIS — N183 Chronic kidney disease, stage 3 unspecified: Secondary | ICD-10-CM | POA: Diagnosis not present

## 2023-04-12 DIAGNOSIS — C44310 Basal cell carcinoma of skin of unspecified parts of face: Secondary | ICD-10-CM | POA: Diagnosis not present

## 2023-04-12 NOTE — Assessment & Plan Note (Signed)
Removed via Mohs

## 2023-04-12 NOTE — Patient Instructions (Addendum)
The [employer]ones on Netflix  RSV, Respiratory Syncitial Virus Vaccine, Arexvy at pharmacy  Tetanus in 2029 or if injured  Magnesium Glycinate 200-400 mg at bedtime  L Tryptophan capsules at bedtime for sleep   4000 steps, 8000 steps  60-80 ounces of fluids a day, 10 ounces every 1-2 hours, have to drink more water if you have any alcohol or caffeine     Preventive Care 65 Years and Older, Female Preventive care refers to lifestyle choices and visits with your health care provider that can promote health and wellness. Preventive care visits are also called wellness exams. What can I expect for my preventive care visit? Counseling Your health care provider may ask you questions about your: Medical history, including: Past medical problems. Family medical history. Pregnancy and menstrual history. History of falls. Current health, including: Memory and ability to understand (cognition). Emotional well-being. Home life and relationship well-being. Sexual activity and sexual health. Lifestyle, including: Alcohol, nicotine or tobacco, and drug use. Access to firearms. Diet, exercise, and sleep habits. Work and work Astronomer. Sunscreen use. Safety issues such as seatbelt and bike helmet use. Physical exam Your health care provider will check your: Height and weight. These may be used to calculate your BMI (body mass index). BMI is a measurement that tells if you are at a healthy weight. Waist circumference. This measures the distance around your waistline. This measurement also tells if you are at a healthy weight and may help predict your risk of certain diseases, such as type 2 diabetes and high blood pressure. Heart rate and blood pressure. Body temperature. Skin for abnormal spots. What immunizations do I need?  Vaccines are usually given at various ages, according to a schedule. Your health care provider will recommend vaccines for you based on your age, medical history,  and lifestyle or other factors, such as travel or where you work. What tests do I need? Screening Your health care provider may recommend screening tests for certain conditions. This may include: Lipid and cholesterol levels. Hepatitis C test. Hepatitis B test. HIV (human immunodeficiency virus) test. STI (sexually transmitted infection) testing, if you are at risk. Lung cancer screening. Colorectal cancer screening. Diabetes screening. This is done by checking your blood sugar (glucose) after you have not eaten for a while (fasting). Mammogram. Talk with your health care provider about how often you should have regular mammograms. BRCA-related cancer screening. This may be done if you have a family history of breast, ovarian, tubal, or peritoneal cancers. Bone density scan. This is done to screen for osteoporosis. Talk with your health care provider about your test results, treatment options, and if necessary, the need for more tests. Follow these instructions at home: Eating and drinking  Eat a diet that includes fresh fruits and vegetables, whole grains, lean protein, and low-fat dairy products. Limit your intake of foods with high amounts of sugar, saturated fats, and salt. Take vitamin and mineral supplements as recommended by your health care provider. Do not drink alcohol if your health care provider tells you not to drink. If you drink alcohol: Limit how much you have to 0-1 drink a day. Know how much alcohol is in your drink. In the [place] one drink equals one 12 oz bottle of beer (355 mL), one 5 oz glass of wine (148 mL), or one 1 oz glass of hard liquor (44 mL). Lifestyle Brush your teeth every morning and night with fluoride toothpaste. Floss one time each day. Exercise for at least 30  minutes 5 or more days each week. Do not use any products that contain nicotine or tobacco. These products include cigarettes, chewing tobacco, and vaping devices, such as e-cigarettes. If you  need help quitting, ask your health care provider. Do not use drugs. If you are sexually active, practice safe sex. Use a condom or other form of protection in order to prevent STIs. Take aspirin only as told by your health care provider. Make sure that you understand how much to take and what form to take. Work with your health care provider to find out whether it is safe and beneficial for you to take aspirin daily. Ask your health care provider if you need to take a cholesterol-lowering medicine (statin). Find healthy ways to manage stress, such as: Meditation, yoga, or listening to music. Journaling. Talking to a trusted person. Spending time with friends and family. Minimize exposure to UV radiation to reduce your risk of skin cancer. Safety Always wear your seat belt while driving or riding in a vehicle. Do not drive: If you have been drinking alcohol. Do not ride with someone who has been drinking. When you are tired or distracted. While texting. If you have been using any mind-altering substances or drugs. Wear a helmet and other protective equipment during sports activities. If you have firearms in your house, make sure you follow all gun safety procedures. What's next? Visit your health care provider once a year for an annual wellness visit. Ask your health care provider how often you should have your eyes and teeth checked. Stay up to date on all vaccines. This information is not intended to replace advice given to you by your health care provider. Make sure you discuss any questions you have with your health care provider. Document Revised: 02/26/2021 Document Reviewed: 02/26/2021 Elsevier Patient Education  2024 [employer].

## 2023-04-29 DIAGNOSIS — L308 Other specified dermatitis: Secondary | ICD-10-CM | POA: Diagnosis not present

## 2023-04-29 DIAGNOSIS — D1801 Hemangioma of skin and subcutaneous tissue: Secondary | ICD-10-CM | POA: Diagnosis not present

## 2023-04-29 DIAGNOSIS — Z85828 Personal history of other malignant neoplasm of skin: Secondary | ICD-10-CM | POA: Diagnosis not present

## 2023-04-29 DIAGNOSIS — Z129 Encounter for screening for malignant neoplasm, site unspecified: Secondary | ICD-10-CM | POA: Diagnosis not present

## 2023-04-29 DIAGNOSIS — D2371 Other benign neoplasm of skin of right lower limb, including hip: Secondary | ICD-10-CM | POA: Diagnosis not present

## 2023-04-29 DIAGNOSIS — L820 Inflamed seborrheic keratosis: Secondary | ICD-10-CM | POA: Diagnosis not present

## 2023-04-29 DIAGNOSIS — L821 Other seborrheic keratosis: Secondary | ICD-10-CM | POA: Diagnosis not present

## 2023-05-19 ENCOUNTER — Other Ambulatory Visit: Payer: Self-pay | Admitting: Family Medicine

## 2023-06-07 ENCOUNTER — Telehealth: Payer: Self-pay | Admitting: Physician Assistant

## 2023-06-07 NOTE — Telephone Encounter (Signed)
Patient is having a severe reaction from her second covid shot, Patient is experiencing numbness and tingling going down her arm. And was told to contact her neurologist

## 2023-06-07 NOTE — Telephone Encounter (Signed)
Patient received vaccine from covid 008/24/2024 and 05/22/2023. Unable to use left arm, completely numb. I advised, to go  ER. Per SaraWertman,PA-C.

## 2023-06-29 ENCOUNTER — Other Ambulatory Visit: Payer: Self-pay | Admitting: Family Medicine

## 2023-07-01 ENCOUNTER — Other Ambulatory Visit: Payer: Self-pay | Admitting: Family Medicine

## 2023-07-01 DIAGNOSIS — E079 Disorder of thyroid, unspecified: Secondary | ICD-10-CM

## 2023-07-19 IMAGING — MR MR HEAD W/O CM
10 series · 48 of 48 positions shown · non-contrast
Comparison: None.

CLINICAL DATA: memory loss

EXAM:
MRI HEAD WITHOUT CONTRAST
TECHNIQUE: Multiplanar, multiecho pulse sequences of the brain and surrounding
structures were obtained without intravenous contrast.

[Series 2: T1 · sagittal · 5.0mm · 0.45mm/px · 3 of 21 slices shown]
[im 1/21]
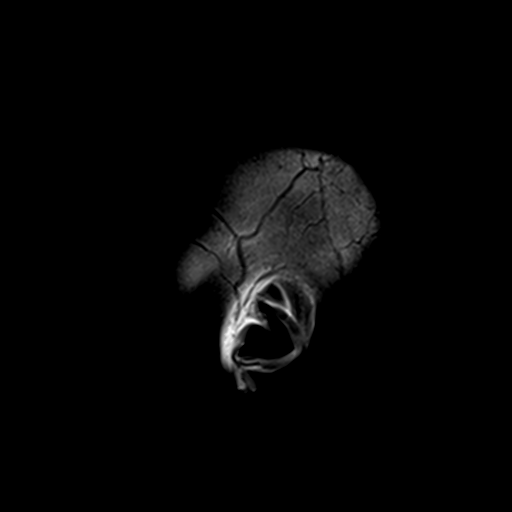
[im 11/21]
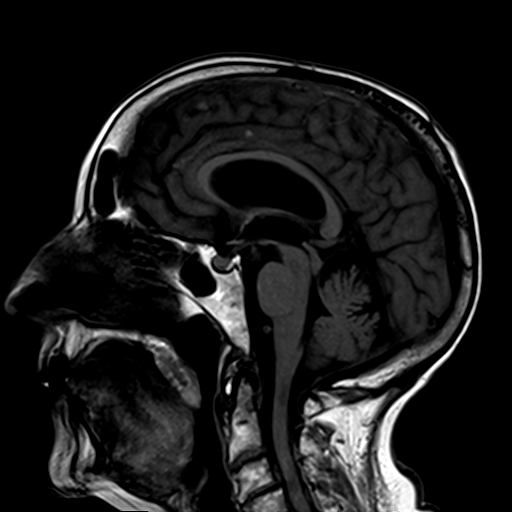
[im 21/21]
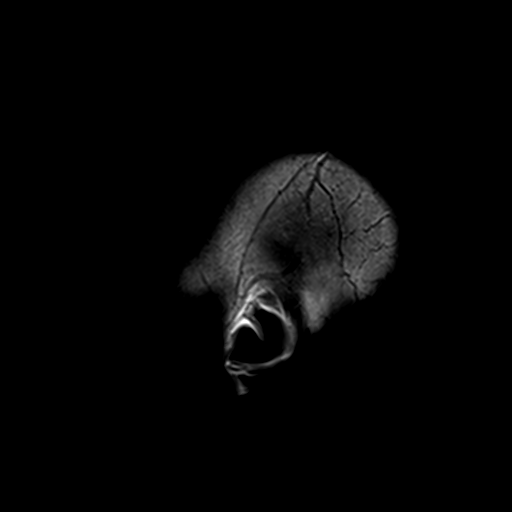

[Series 3: DWI · axial · 3.0mm · 1.80mm/px · z∈[-74,+70]mm · 9 of 99 slices shown (1 of 4)]
[im 1/99]
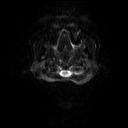
[im 13/99]
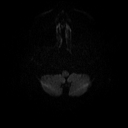
[im 25/99]
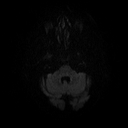
[im 37/99]
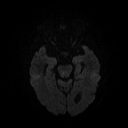
[im 50/99]
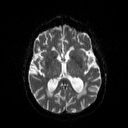
[im 62/99]
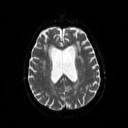
[im 74/99]
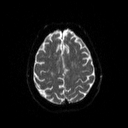
[im 86/99]
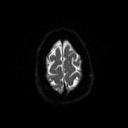
[im 99/99]
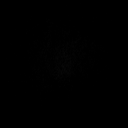

[Series 4: DWI · axial · 3.0mm · 1.80mm/px · z∈[-74,+70]mm · 4 of 48 slices shown (2 of 4)]
[im 1/48]
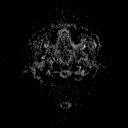
[im 16/48]
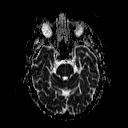
[im 32/48]
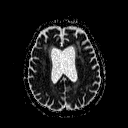
[im 48/48]
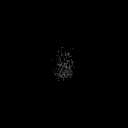

[Series 5: DWI · coronal · 5.0mm · 1.80mm/px · 6 of 70 slices shown (3 of 4)]
[im 1/70]
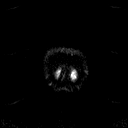
[im 14/70]
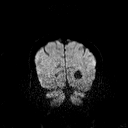
[im 28/70]
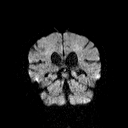
[im 42/70]
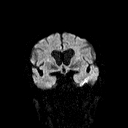
[im 56/70]
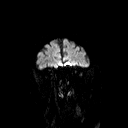
[im 70/70]
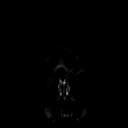

[Series 6: DWI · coronal · 5.0mm · 1.80mm/px · 3 of 35 slices shown (4 of 4)]
[im 1/35]
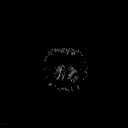
[im 18/35]
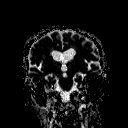
[im 35/35]
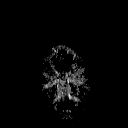

[Series 7: T2 · axial · 5.0mm · 0.51mm/px · z∈[-71,+72]mm · 2 of 22 slices shown (1 of 2)]
[im 1/22]
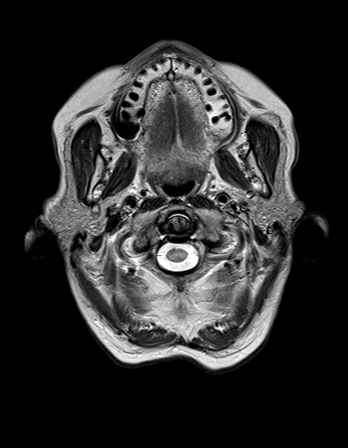
[im 22/22]
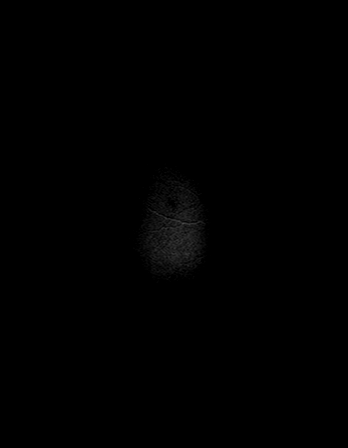

[Series 8: FLAIR · axial · 3.0mm · 0.45mm/px · z∈[-66,+65]mm · 3 of 30 slices shown]
[im 1/30]
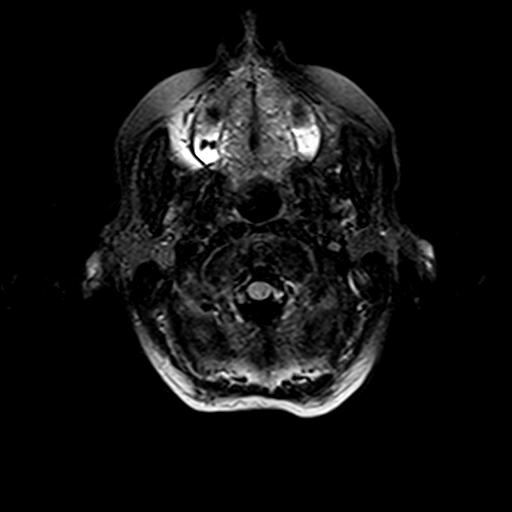
[im 15/30]
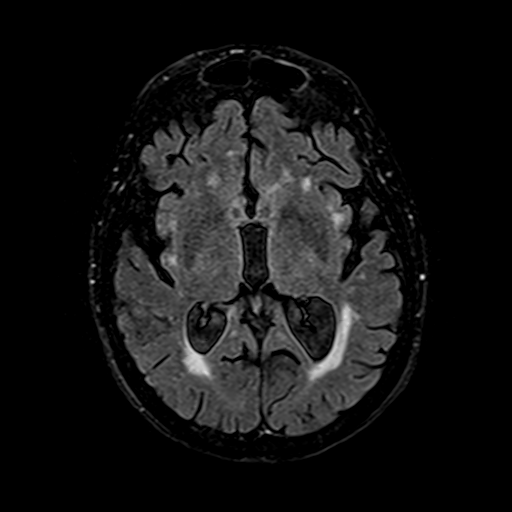
[im 30/30]
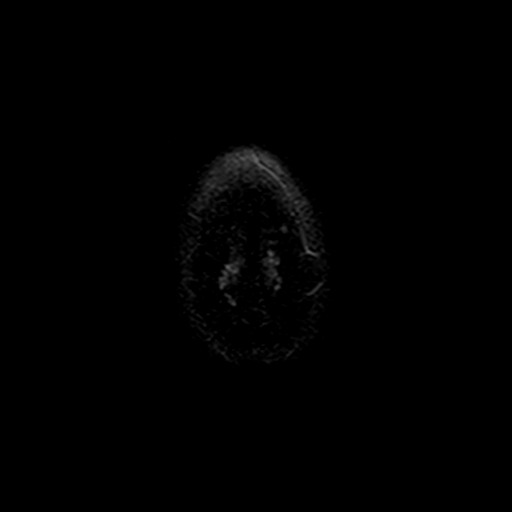

[Series 10: swi_images · axial · 4.0mm · 0.90mm/px · z∈[-68,+68]mm · 3 of 36 slices shown]
[im 1/36]
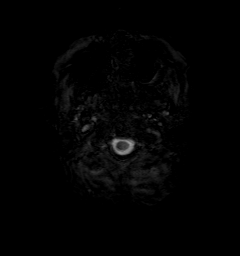
[im 18/36]
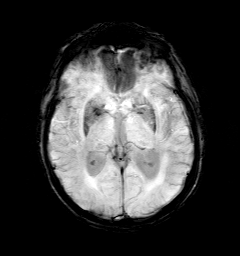
[im 36/36]
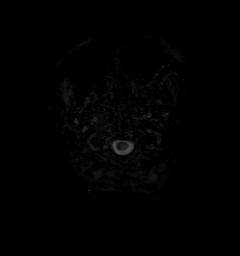

[Series 11: t1_mpr_tra · axial · 1.0mm · 0.71mm/px · z∈[-69,+70]mm · 13 of 144 slices shown]
[im 1/144]
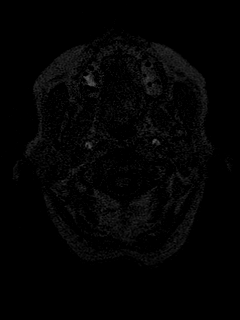
[im 12/144]
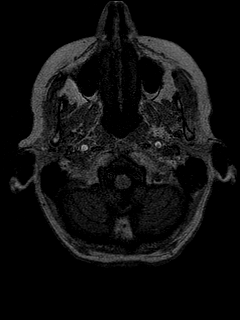
[im 24/144]
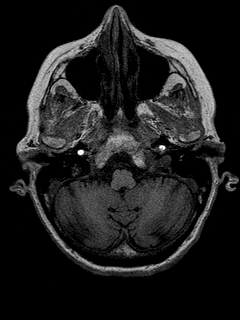
[im 36/144]
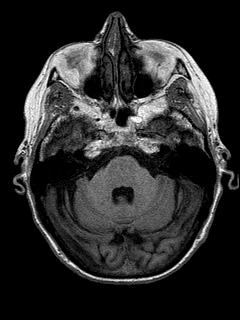
[im 48/144]
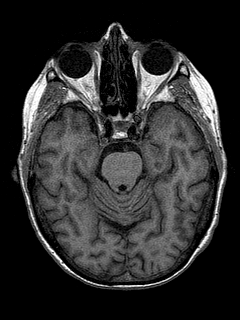
[im 60/144]
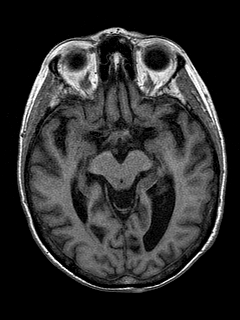
[im 72/144]
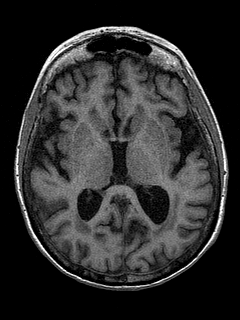
[im 84/144]
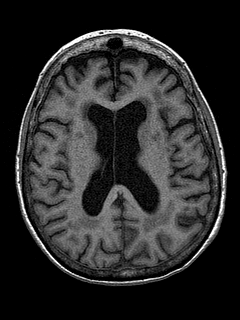
[im 96/144]
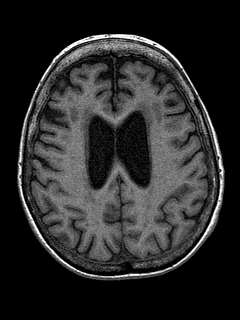
[im 108/144]
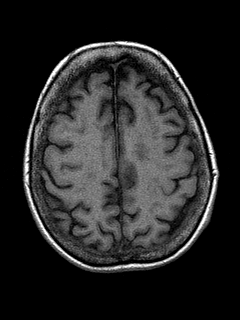
[im 120/144]
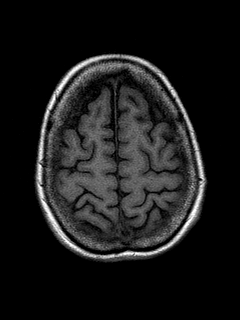
[im 132/144]
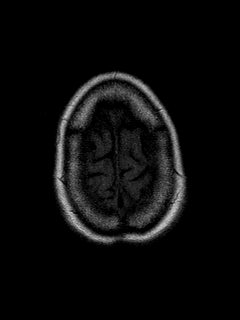
[im 144/144]
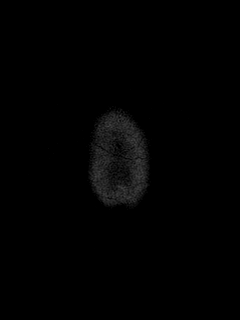

[Series 12: T2 · coronal · 5.0mm · 0.45mm/px · 2 of 27 slices shown (2 of 2)]
[im 1/27]
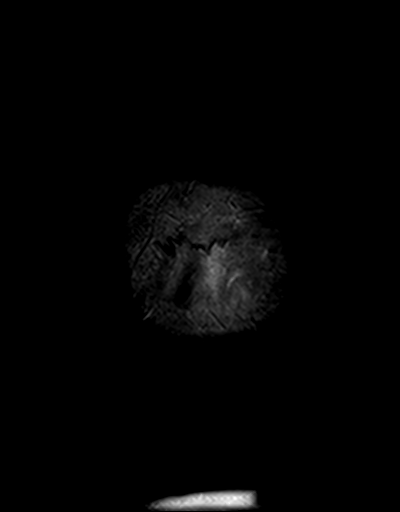
[im 27/27]
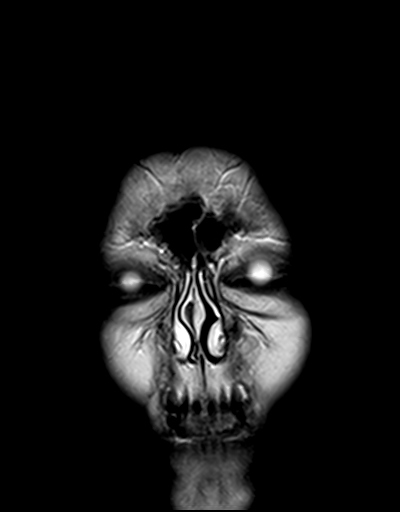

[48 of 48 positions shown; findings below may reference images not displayed]

FINDINGS: Brain: No acute infarction, hemorrhage, hydrocephalus, extra-axial
collection or mass lesion. Moderate patchy and confluent T2
hyperintensity within the white matter, nonspecific but most likely
related to chronic microvascular ischemic disease. Moderate
generalized volume loss with ex vacuo ventricular dilation.

Vascular: Major arterial flow voids are maintained at the skull
base.

Skull and upper cervical spine: Normal marrow signal.

Sinuses/Orbits: Mild paranasal sinus mucosal thickening.
Unremarkable orbits.

Other: No mastoid effusions.
IMPRESSION: 1. No evidence of acute intracranial abnormality or reversible cause
of memory loss.
2. Moderate atrophy and chronic microvascular ischemic disease.

## 2023-07-29 ENCOUNTER — Encounter: Payer: Self-pay | Admitting: Physician Assistant

## 2023-07-29 ENCOUNTER — Ambulatory Visit: Payer: Medicare Other | Admitting: Physician Assistant

## 2023-07-29 VITALS — BP 158/68 | HR 80 | Resp 20 | Ht 62.0 in | Wt 121.0 lb

## 2023-07-29 DIAGNOSIS — G3184 Mild cognitive impairment, so stated: Secondary | ICD-10-CM

## 2023-07-29 MED ORDER — DONEPEZIL HCL 5 MG PO TABS: 5.0000 mg | ORAL_TABLET | Freq: Every morning | ORAL | 11 refills | Status: DC

## 2023-07-29 NOTE — Progress Notes (Signed)
Assessment/Plan:   Memory Impairment   Molly Williams is a very pleasant 89 y.o. RH female with a history of HTN, HLD, TIA 11/2022, ocular migraines, HOH, GERD, B12 and D deficiency, anxiety, and a history of MCI presenting today in  1 year follow-up for evaluation of memory loss. Patient is not on antidementia medication.  MRI of the brain, personally reviewed remarkable for moderate ischemic changes in the white matter, mild atrophy.  No acute findings.  Patient is able to perform her ADLs without difficulty, and drives very short distances.  Discussed with her family the results of the MRI of the brain, and her overall performance.  MMSE today is 25/30.  Discussed starting donepezil 5 mg daily with goal of increasing it to 10 mg daily for better coverage in the future if tolerated.  Family agrees.     Recommendations:   Follow up in 6 months  Start donepezil 5 mg daily, side effects discussed  Recommend good control of cardiovascular risk factors, secondary stroke prevention. Continue baby ASA daily Continue B12, B1 supplements  Continue to control mood as per PCP Recommend decreasing the consumption of alcohol to less than 7 drinks a week Monitor driving    Subjective:   This patient is accompanied in the office by her son and daughter  who supplements the history. Previous records as well as any outside records available were reviewed prior to todays visit.   Patient was last seen on 12/02/21 with MMSE 26/30      Any changes in memory since last visit? " Worse, especially with STM". I had a TIA in 11/2022  Some good and bad days.  (Before, she may have difficulty concentrating when anxious).  She enjoys working on Production designer, theatre/television/film, likes to play scrabble, read, addicted to politics on TV. repeats oneself?  Denies  Disoriented when walking into a room?  Patient denies when at home.     Misplacing objects?  Patient denies   Wandering behavior?   denies   Any personality changes since  last visit?   Denies.   Any worsening depression?: denies. "No depression at all".    Hallucinations or paranoia?  Denies, but son reports  that one night she  thought that someone was on the front door, that a man came back to do the [facility] repair. It  is unclear if she dreamed it or hallucinate it.   Seizures?   denies    Any sleep changes? Sleeps well with trazodone.  Denies vivid dreams, REM behavior or sleepwalking   Sleep apnea?   denies Any hygiene concerns?   denies   Independent of bathing and dressing?  Endorsed  Does the patient needs help with medications? Patient is in charge   Who is in charge of the finances?  Patient is in charge     Any changes in appetite?  Denies.     Patient have trouble swallowing?  denies .  Does the patient cook?  Yes, every day.  Any kitchen accidents such as leaving the stove on?   Denies.   Any headaches?    denies Vision changes? denies Chronic pain?  denies   Ambulates with difficulty?  Denies.  "I don't  take walk as I should but do chair exercises " Recent falls or head injuries?    denies      Unilateral weakness, numbness or tingling?   Denies.  She had a possible TIA in March 2024 with slurred speech, with imaging  negative for stroke.  She initially had double antiplatelet therapy, and now on baby aspirin daily.  No recurrence of symptoms.  She is now tight control by cardiology. Any tremors?  Denies.   Any anosmia?    Denies.   Any incontinence of urine?  Denies.   Any bowel dysfunction?  Denies.      Patient lives alone, daughter is nearby.     Does the patient drive?  Short distances only.  Son reports that she has gotten lost a couple of times and her children had to go to get her.  She continues to drink 10 glasses of wine a week.    Initial Evaluation 04/24/21 The patient is seen in neurologic consultation at the request of Bradd Canary, MD for the evaluation of memory.  The patient is accompanied by daughter  who supplements the  history. She is a 89 y.o. year old female who has had memory issues for about 3 to 4 years, when she noticed that her instant recall and short-term memory was "not good, Andrey Campanile has to remind me ".  She is worried that very specific information "is gone "some days are worse than others.  This creates anxiety, at which time "the memory is not existent".  She is also concerned about memory storage, but daughter reports that she is much sharper in the morning, and later in the day is when she begins to have trouble remembering.  If the conversation went on, at the end of the day she will not have any recall of the conversation.  She admits to have difficulty concentrating.  She lives alone, and is independent of dressing and changing.  She is very sociable, and she reports her mood being "happy, thankful, and likes to be helpful to other people ".  After her husband's death, the patient may have had some degree of depression, but she is now better, having found new friends, and began dating, which has helped her emotionally.  She denies irritability she likes to exercise daily.  She reports her sleep as "has always been terrible, ever since I was young, and he used to work as a Adult nurse, there is always something in my mind ".  During the night, sometimes she wakes up at 3 AM, and keeps herself busy, without going back to sleep immediately.  Because she is tired during the day, then she takes a small nap feeling refreshed.  She denies any vivid dreams or sleepwalking.  No hallucinations or paranoia.  She denies placing objects in unusual spots. She takes her own medications, without forgetting any doses.  She still manages her finances without missing any bills.  Her appetite is good, denies trouble swallowing.  She enjoys cooking, and denies leaving the stove on. She has not forgotten her common recipes, her favorite one is Warehouse manager. She ambulates without difficulty without the use of a walker or a cane.  She drives  getting lost.  She only drives to places that she is familiar with.She has a history of classic headaches, with aura, but she has not have a months.  She denies any falls, head injuries, double vision, dizziness unilateral weakness or retention, constipation or diarrhea, or anosmia.  Denies history of deep apnea.  She drinks about 10 glasses of wine a week.   Family history significant stroke in several members of her family, and NPH  with dementia in brother   Labs 8/11/22c TSH 4.16, B12 186 (low)    MRI brain  wo contrast 05/09/21 No evidence of acute intracranial abnormality or reversible cause of memory loss.: No acute infarction, hemorrhage, hydrocephalus, extra-axial collection or mass lesion. Moderate atrophy and chronic microvascular ischemic disease.  Past Medical History:  Diagnosis Date   Anemia 03/29/2015   Arthritis    Atrophic vaginitis    Cholelithiasis 2008   Fibrocystic breast disease    bx 2008 neg per pt report   GERD (gastroesophageal reflux disease)    Hearing loss    Hearing loss sensory, bilateral    bil/hearing aids   Hematuria    chronic Dr. Merry Lofty   Hyperlipidemia 11/17/2016   Lupus (systemic lupus erythematosus) (HCC)    Dr. Kellie Simmering   Macular degeneration 11/17/2016   Migraine aura without headache    h/o ocular migraines   Osteopenia    Preventative health care 11/18/2016   Renal insufficiency 03/29/2015   Retinal detachment with break    right/laser   Retinal tear of right eye 11/17/2016   Skin lesion of face 01/09/2016   Spinal stenosis    Thyroid disease    hypothyroidism   Urinary tract bacterial infections    has had urethral dilations in past   Vitamin B12 deficiency 01/09/2016   Vitamin D deficiency 07/11/2015     Past Surgical History:  Procedure Laterality Date   APPENDECTOMY  2006   CATARACT EXTRACTION W/ INTRAOCULAR LENS  IMPLANT, BILATERAL     CHOLECYSTECTOMY     EYE SURGERY Bilateral    cataracts   TONSILLECTOMY  1940   VAGINAL  HYSTERECTOMY     partial     PREVIOUS MEDICATIONS:   CURRENT MEDICATIONS:  Outpatient Encounter Medications as of 07/29/2023  Medication Sig   albuterol (VENTOLIN HFA) 108 (90 Base) MCG/ACT inhaler Inhale 2 puffs into the lungs every 6 (six) hours as needed for wheezing or shortness of breath.   aspirin EC 81 MG tablet Take 1 tablet (81 mg total) by mouth daily. Swallow whole.   atorvastatin (LIPITOR) 40 MG tablet Take 1 tablet (40 mg total) by mouth daily.   Cholecalciferol (VITAMIN D) 2000 units CAPS Take 2,000 Units by mouth daily.   donepezil (ARICEPT) 5 MG tablet Take 1 tablet (5 mg total) by mouth every morning.   levothyroxine (SYNTHROID) 25 MCG tablet TAKE ONE TABLET BY MOUTH ONE TIME DAILY EXCEPT ON TUESDAYS AND SATURDAYS TAKE TWO TABLETS   omeprazole (PRILOSEC) 20 MG capsule TAKE ONE CAPSULE BY MOUTH ONE TIME DAILY   traZODone (DESYREL) 50 MG tablet Take 0.5-1 tablets (25-50 mg total) by mouth at bedtime as needed for sleep.   Facility-Administered Encounter Medications as of 07/29/2023  Medication   cyanocobalamin (VITAMIN B12) injection 1,000 mcg     Objective:     PHYSICAL EXAMINATION:    VITALS:   Vitals:   07/29/23 0908  BP: (!) 158/68  Pulse: 80  Resp: 20  SpO2: 97%  Weight: 121 lb (54.9 kg)  Height: 5\' 2"  (1.575 m)    GEN:  The patient appears stated age and is in NAD. HEENT:  Normocephalic, atraumatic.   Neurological examination:  General: NAD, well-groomed, appears stated age. Orientation: The patient is alert. Oriented to person, place and date Cranial nerves: There is good facial symmetry.anxious appearing the speech is fluent and clear. No aphasia or dysarthria. Fund of knowledge is appropriate. Recent memory impaired and remote memory is normal.  Attention and concentration are normal.  Able to name objects and repeat phrases.  Hearing is decreased to conversational  tone .   Delayed recall 2/3 Sensation: Sensation is intact to light touch  throughout Motor: Strength is at least antigravity x4. DTR's 2/4 in UE/LE       No data to display             07/29/2023   12:00 PM 12/02/2021   12:00 PM 04/24/2021   12:00 PM  MMSE - Mini Mental State Exam  Orientation to time 5 5 5   Orientation to Place 4 5 5   Registration 3 3 3   Attention/ Calculation 4 5 2   Recall 2 2 2   Language- name 2 objects 2 2 2   Language- repeat 1 1 1   Language- follow 3 step command 1 3 3   Language- read & follow direction 1 1 1   Write a sentence 1 1 1   Copy design 1 1 1   Total score 25 29 26        Movement examination: Tone: There is normal tone in the UE/LE Abnormal movements:  no tremor.  No myoclonus.  No asterixis.   Coordination:  There is no decremation with RAM's. Normal finger to nose  Gait and Station: The patient has no difficulty arising out of a deep-seated chair without the use of the hands. The patient's stride length is good.  Gait is cautious and narrow.   Thank you for allowing [place] the opportunity to participate in the care of this nice patient. Please do not hesitate to contact us for any questions or concerns.   Total time spent on today's visit was 45 minutes dedicated to this patient today, preparing to see patient, examining the patient, ordering tests and/or medications and counseling the patient, documenting clinical information in the EHR or other health record, independently interpreting results and communicating results to the patient/family, discussing treatment and goals, answering patient's questions and coordinating care.  Cc:  Bradd Canary, MD  Marlowe Kays 07/29/2023 12:28 PM

## 2023-07-29 NOTE — Patient Instructions (Signed)
It was a pleasure to see you today at our office. You are  doing great!  Recommendations:  Follow up in 6 months We will start donepezil 5 mg daily.  Recommend good control of cardiovascular risk factors.   Try meditation and breathing excercises to help calm down and sleep better    RECOMMENDATIONS FOR ALL PATIENTS WITH MEMORY PROBLEMS: 1. Continue to exercise (Recommend 30 minutes of walking everyday, or 3 hours every week) 2. Increase social interactions - continue going to [place] and enjoy social gatherings with friends and family 3. Eat healthy, avoid fried foods and eat more fruits and vegetables 4. Maintain adequate blood pressure, blood sugar, and blood cholesterol level. Reducing the risk of stroke and cardiovascular disease also helps promoting better memory. 5. Avoid stressful situations. Live a simple life and avoid aggravations. Organize your time and prepare for the next day in anticipation. 6. Sleep well, avoid any interruptions of sleep and avoid any distractions in the bedroom that may interfere with adequate sleep quality 7. Avoid sugar, avoid sweets as there is a strong link between excessive sugar intake, diabetes, and cognitive impairment We discussed the Mediterranean diet, which has been shown to help patients reduce the risk of progressive memory disorders and reduces cardiovascular risk. This includes eating fish, eat fruits and green leafy vegetables, nuts like almonds and hazelnuts, walnuts, and also use olive oil. Avoid fast foods and fried foods as much as possible. Avoid sweets and sugar as sugar use has been linked to worsening of memory function.  There is always a concern of gradual progression of memory problems. If this is the case, then we may need to adjust level of care according to patient needs. Support, both to the patient and caregiver, should then be put into place.        FALL PRECAUTIONS: Be cautious when walking. Scan the area for obstacles that  may increase the risk of trips and falls. When getting up in the mornings, sit up at the edge of the bed for a few minutes before getting out of bed. Consider elevating the bed at the head end to avoid drop of blood pressure when getting up. Walk always in a well-lit room (use night lights in the walls). Avoid area rugs or power cords from appliances in the middle of the walkways. Use a walker or a cane if necessary and consider physical therapy for balance exercise. Get your eyesight checked regularly.  FINANCIAL OVERSIGHT: Supervision, especially oversight when making financial decisions or transactions is also recommended.  HOME SAFETY: Consider the safety of the kitchen when operating appliances like stoves, microwave oven, and blender. Consider having supervision and share cooking responsibilities until no longer able to participate in those. Accidents with firearms and other hazards in the house should be identified and addressed as well.   ABILITY TO BE LEFT ALONE: If patient is unable to contact 911 operator, consider using LifeLine, or when the need is there, arrange for someone to stay with patients. Smoking is a fire hazard, consider supervision or cessation. Risk of wandering should be assessed by caregiver and if detected at any point, supervision and safe proof recommendations should be instituted.  MEDICATION SUPERVISION: Inability to self-administer medication needs to be constantly addressed. Implement a mechanism to ensure safe administration of the medications.   DRIVING: Regarding driving, in patients with progressive memory problems, driving will be impaired. We advise to have someone else do the driving if trouble finding directions or if minor accidents  are reported. Independent driving assessment is available to determine safety of driving.   If you are interested in the driving assessment, you can contact the following:  [facility] [employer] [facility] in [place]  [phone]  [facility] [phone]  [facility] [phone] or [phone]    Mediterranean Diet A Mediterranean diet refers to food and lifestyle choices that are based on the traditions of countries located on the [employer]. This way of eating has been shown to help prevent certain conditions and improve outcomes for people who have chronic diseases, like kidney disease and heart disease. What are tips for following this plan? Lifestyle  Cook and eat meals together with your family, when possible. Drink enough fluid to keep your urine clear or pale yellow. Be physically active every day. This includes: Aerobic exercise like running or swimming. Leisure activities like gardening, walking, or housework. Get 7-8 hours of sleep each night. If recommended by your health care provider, drink red wine in moderation. This means 1 glass a day for nonpregnant women and 2 glasses a day for men. A glass of wine equals 5 oz (150 mL). Reading food labels  Check the serving size of packaged foods. For foods such as rice and pasta, the serving size refers to the amount of cooked product, not dry. Check the total fat in packaged foods. Avoid foods that have saturated fat or trans fats. Check the ingredients list for added sugars, such as corn syrup. Shopping  At the grocery store, buy most of your food from the areas near the walls of the store. This includes: Fresh fruits and vegetables (produce). Grains, beans, nuts, and seeds. Some of these may be available in unpackaged forms or large amounts (in bulk). Fresh seafood. Poultry and eggs. Low-fat dairy products. Buy whole ingredients instead of prepackaged foods. Buy fresh fruits and vegetables in-season from local farmers markets. Buy frozen fruits and vegetables in resealable bags. If you do not have access to quality fresh seafood, buy precooked frozen shrimp or canned  fish, such as tuna, salmon, or sardines. Buy small amounts of raw or cooked vegetables, salads, or olives from the deli or salad bar at your store. Stock your pantry so you always have certain foods on hand, such as olive oil, canned tuna, canned tomatoes, rice, pasta, and beans. Cooking  Cook foods with extra-virgin olive oil instead of using butter or other vegetable oils. Have meat as a side dish, and have vegetables or grains as your main dish. This means having meat in small portions or adding small amounts of meat to foods like pasta or stew. Use beans or vegetables instead of meat in common dishes like chili or lasagna. Experiment with different cooking methods. Try roasting or broiling vegetables instead of steaming or sauteing them. Add frozen vegetables to soups, stews, pasta, or rice. Add nuts or seeds for added healthy fat at each meal. You can add these to yogurt, salads, or vegetable dishes. Marinate fish or vegetables using olive oil, lemon juice, garlic, and fresh herbs. Meal planning  Plan to eat 1 vegetarian meal one day each week. Try to work up to 2 vegetarian meals, if possible. Eat seafood 2 or more times a week. Have healthy snacks readily available, such as: Vegetable sticks with hummus. Greek yogurt. Fruit and nut trail mix. Eat balanced meals throughout the week. This includes: Fruit: 2-3 servings a day Vegetables: 4-5 servings a day Low-fat dairy: 2 servings a day Fish,  poultry, or lean meat: 1 serving a day Beans and legumes: 2 or more servings a week Nuts and seeds: 1-2 servings a day Whole grains: 6-8 servings a day Extra-virgin olive oil: 3-4 servings a day Limit red meat and sweets to only a few servings a month What are my food choices? Mediterranean diet Recommended Grains: Whole-grain pasta. Brown rice. Bulgar wheat. Polenta. Couscous. Whole-wheat bread. Orpah Cobb. Vegetables: Artichokes. Beets. Broccoli. Cabbage. Carrots. Eggplant. Green  beans. Chard. Kale. Spinach. Onions. Leeks. Peas. Squash. Tomatoes. Peppers. Radishes. Fruits: Apples. Apricots. Avocado. Berries. Bananas. Cherries. Dates. Figs. Grapes. Lemons. Melon. Oranges. Peaches. Plums. Pomegranate. Meats and other protein foods: Beans. Almonds. Sunflower seeds. Pine nuts. Peanuts. Cod. Salmon. Scallops. Shrimp. Tuna. Tilapia. Clams. Oysters. Eggs. Dairy: Low-fat milk. Cheese. Greek yogurt. Beverages: Water. Red wine. Herbal tea. Fats and oils: Extra virgin olive oil. Avocado oil. Grape seed oil. Sweets and desserts: [place] yogurt with honey. Baked apples. Poached pears. Trail mix. Seasoning and other foods: Basil. Cilantro. Coriander. Cumin. Mint. Parsley. Sage. Rosemary. Tarragon. Garlic. Oregano. Thyme. Pepper. Balsalmic vinegar. Tahini. Hummus. Tomato sauce. Olives. Mushrooms. Limit these Grains: Prepackaged pasta or rice dishes. Prepackaged cereal with added sugar. Vegetables: Deep fried potatoes (french fries). Fruits: Fruit canned in syrup. Meats and other protein foods: Beef. Pork. Lamb. Poultry with skin. Hot dogs. Tomasa Blase. Dairy: Ice cream. Sour cream. Whole milk. Beverages: Juice. Sugar-sweetened soft drinks. Beer. Liquor and spirits. Fats and oils: Butter. Canola oil. Vegetable oil. Beef fat (tallow). Lard. Sweets and desserts: Cookies. Cakes. Pies. Candy. Seasoning and other foods: Mayonnaise. Premade sauces and marinades. The items listed may not be a complete list. Talk with your dietitian about what dietary choices are right for you. Summary The Mediterranean diet includes both food and lifestyle choices. Eat a variety of fresh fruits and vegetables, beans, nuts, seeds, and whole grains. Limit the amount of red meat and sweets that you eat. Talk with your health care provider about whether it is safe for you to drink red wine in moderation. This means 1 glass a day for nonpregnant women and 2 glasses a day for men. A glass of wine equals 5 oz (150  mL). This information is not intended to replace advice given to you by your health care provider. Make sure you discuss any questions you have with your health care provider. Document Released: 04/23/2016 Document Revised: 05/26/2016 Document Reviewed: 04/23/2016 Elsevier Interactive Patient Education  2017 [employer].

## 2023-09-17 ENCOUNTER — Ambulatory Visit (INDEPENDENT_AMBULATORY_CARE_PROVIDER_SITE_OTHER): Payer: Medicare Other | Admitting: *Deleted

## 2023-09-17 VITALS — Ht 62.0 in | Wt 114.0 lb

## 2023-09-17 DIAGNOSIS — Z0000 Encounter for general adult medical examination without abnormal findings: Secondary | ICD-10-CM

## 2023-09-17 NOTE — Patient Instructions (Signed)
 Molly Williams , Thank you for taking time to come for your Medicare Wellness Visit. I appreciate your ongoing commitment to your health goals. Please review the following plan we discussed and let me know if I can assist you in the future.     This is a list of the screening recommended for you and due dates:  Health Maintenance  Topic Date Due   Zoster (Shingles) Vaccine (1 of 2) 03/08/1950   COVID-19 Vaccine (4 - 2024-25 season) 05/16/2023   Flu Shot  12/13/2023*   Medicare Annual Wellness Visit  09/16/2024   DTaP/Tdap/Td vaccine (3 - Td or Tdap) 06/22/2028   Pneumonia Vaccine  Completed   DEXA scan (bone density measurement)  Completed   HPV Vaccine  Aged Out  *Topic was postponed. The date shown is not the original due date.    Next appointment: Follow up in one year for your annual wellness visit.   Preventive Care 13 Years and Older, Female Preventive care refers to lifestyle choices and visits with your health care provider that can promote health and wellness. What does preventive care include? A yearly physical exam. This is also called an annual well check. Dental exams once or twice a year. Routine eye exams. Ask your health care provider how often you should have your eyes checked. Personal lifestyle choices, including: Daily care of your teeth and gums. Regular physical activity. Eating a healthy diet. Avoiding tobacco and drug use. Limiting alcohol use. Practicing safe sex. Taking low-dose aspirin  every day. Taking vitamin and mineral supplements as recommended by your health care provider. What happens during an annual well check? The services and screenings done by your health care provider during your annual well check will depend on your age, overall health, lifestyle risk factors, and family history of disease. Counseling  Your health care provider may ask you questions about your: Alcohol use. Tobacco use. Drug use. Emotional well-being. Home and  relationship well-being. Sexual activity. Eating habits. History of falls. Memory and ability to understand (cognition). Work and work astronomer. Reproductive health. Screening  You may have the following tests or measurements: Height, weight, and BMI. Blood pressure. Lipid and cholesterol levels. These may be checked every 5 years, or more frequently if you are over 57 years old. Skin check. Lung cancer screening. You may have this screening every year starting at age 31 if you have a 30-pack-year history of smoking and currently smoke or have quit within the past 15 years. Fecal occult blood test (FOBT) of the stool. You may have this test every year starting at age 14. Flexible sigmoidoscopy or colonoscopy. You may have a sigmoidoscopy every 5 years or a colonoscopy every 10 years starting at age 73. Hepatitis C blood test. Hepatitis B blood test. Sexually transmitted disease (STD) testing. Diabetes screening. This is done by checking your blood sugar (glucose) after you have not eaten for a while (fasting). You may have this done every 1-3 years. Bone density scan. This is done to screen for osteoporosis. You may have this done starting at age 63. Mammogram. This may be done every 1-2 years. Talk to your health care provider about how often you should have regular mammograms. Talk with your health care provider about your test results, treatment options, and if necessary, the need for more tests. Vaccines  Your health care provider may recommend certain vaccines, such as: Influenza vaccine. This is recommended every year. Tetanus, diphtheria, and acellular pertussis (Tdap, Td) vaccine. You may need a  Td booster every 10 years. Zoster vaccine. You may need this after age 76. Pneumococcal 13-valent conjugate (PCV13) vaccine. One dose is recommended after age 86. Pneumococcal polysaccharide (PPSV23) vaccine. One dose is recommended after age 60. Talk to your health care provider  about which screenings and vaccines you need and how often you need them. This information is not intended to replace advice given to you by your health care provider. Make sure you discuss any questions you have with your health care provider. Document Released: 09/27/2015 Document Revised: 05/20/2016 Document Reviewed: 07/02/2015 Elsevier Interactive Patient Education  2017 [employer].  Fall Prevention in the Home Falls can cause injuries. They can happen to people of all ages. There are many things you can do to make your home safe and to help prevent falls. What can I do on the outside of my home? Regularly fix the edges of walkways and driveways and fix any cracks. Remove anything that might make you trip as you walk through a door, such as a raised step or threshold. Trim any bushes or trees on the path to your home. Use bright outdoor lighting. Clear any walking paths of anything that might make someone trip, such as rocks or tools. Regularly check to see if handrails are loose or broken. Make sure that both sides of any steps have handrails. Any raised decks and porches should have guardrails on the edges. Have any leaves, snow, or ice cleared regularly. Use sand or salt on walking paths during winter. Clean up any spills in your garage right away. This includes oil or grease spills. What can I do in the bathroom? Use night lights. Install grab bars by the toilet and in the tub and shower. Do not use towel bars as grab bars. Use non-skid mats or decals in the tub or shower. If you need to sit down in the shower, use a plastic, non-slip stool. Keep the floor dry. Clean up any water that spills on the floor as soon as it happens. Remove soap buildup in the tub or shower regularly. Attach bath mats securely with double-sided non-slip rug tape. Do not have throw rugs and other things on the floor that can make you trip. What can I do in the bedroom? Use night lights. Make sure  that you have a light by your bed that is easy to reach. Do not use any sheets or blankets that are too big for your bed. They should not hang down onto the floor. Have a firm chair that has side arms. You can use this for support while you get dressed. Do not have throw rugs and other things on the floor that can make you trip. What can I do in the kitchen? Clean up any spills right away. Avoid walking on wet floors. Keep items that you use a lot in easy-to-reach places. If you need to reach something above you, use a strong step stool that has a grab bar. Keep electrical cords out of the way. Do not use floor polish or wax that makes floors slippery. If you must use wax, use non-skid floor wax. Do not have throw rugs and other things on the floor that can make you trip. What can I do with my stairs? Do not leave any items on the stairs. Make sure that there are handrails on both sides of the stairs and use them. Fix handrails that are broken or loose. Make sure that handrails are as long as the stairways. Check any  carpeting to make sure that it is firmly attached to the stairs. Fix any carpet that is loose or worn. Avoid having throw rugs at the top or bottom of the stairs. If you do have throw rugs, attach them to the floor with carpet tape. Make sure that you have a light switch at the top of the stairs and the bottom of the stairs. If you do not have them, ask someone to add them for you. What else can I do to help prevent falls? Wear shoes that: Do not have high heels. Have rubber bottoms. Are comfortable and fit you well. Are closed at the toe. Do not wear sandals. If you use a stepladder: Make sure that it is fully opened. Do not climb a closed stepladder. Make sure that both sides of the stepladder are locked into place. Ask someone to hold it for you, if possible. Clearly mark and make sure that you can see: Any grab bars or handrails. First and last steps. Where the edge of  each step is. Use tools that help you move around (mobility aids) if they are needed. These include: Canes. Walkers. Scooters. Crutches. Turn on the lights when you go into a dark area. Replace any light bulbs as soon as they burn out. Set up your furniture so you have a clear path. Avoid moving your furniture around. If any of your floors are uneven, fix them. If there are any pets around you, be aware of where they are. Review your medicines with your doctor. Some medicines can make you feel dizzy. This can increase your chance of falling. Ask your doctor what other things that you can do to help prevent falls. This information is not intended to replace advice given to you by your health care provider. Make sure you discuss any questions you have with your health care provider. Document Released: 06/27/2009 Document Revised: 02/06/2016 Document Reviewed: 10/05/2014 Elsevier Interactive Patient Education  2017 [employer].

## 2023-09-17 NOTE — Progress Notes (Signed)
 Subjective:   Molly Williams is a 89 y.o. female who presents for Medicare Annual (Subsequent) preventive examination.  Visit Complete: Virtual I connected with  Molly Williams on 09/17/23 by a audio enabled telemedicine application and verified that I am speaking with the correct person using two identifiers.  Patient Location: Home  Provider Location: Office/Clinic  I discussed the limitations of evaluation and management by telemedicine. The patient expressed understanding and agreed to proceed.  Vital Signs: Because this visit was a virtual/telehealth visit, some criteria may be missing or patient reported. Any vitals not documented were not able to be obtained and vitals that have been documented are patient reported.   Cardiac Risk Factors include: advanced age (>52men, >77 women);dyslipidemia;hypertension     Objective:    Today's Vitals   09/17/23 1431  Weight: 114 lb (51.7 kg)  Height: 5' 2 (1.575 m)   Body mass index is 20.85 kg/m.     09/17/2023    2:30 PM 07/29/2023    9:09 AM 12/13/2022   12:31 PM 12/12/2022    1:02 PM 03/30/2022    1:36 PM 12/02/2021   11:12 AM 06/03/2021    9:24 AM  Advanced Directives  Does Patient Have a Medical Advance Directive? Yes Yes Yes Yes Yes Yes Yes  Type of Estate Agent of [place];Living will Healthcare Power of [employer] Power of [employer] Power of [place];Living will    Does patient want to make changes to medical advance directive? No - Patient declined No - Patient declined No - Guardian declined No - Patient declined     Copy of Healthcare Power of Attorney in Chart? Yes - validated most recent copy scanned in chart (See row information) No - copy requested   Yes - validated most recent copy scanned in chart (See row information)      Current Medications (verified) Outpatient Encounter Medications as of 09/17/2023  Medication Sig   albuterol  (VENTOLIN  HFA) 108 (90 Base) MCG/ACT  inhaler Inhale 2 puffs into the lungs every 6 (six) hours as needed for wheezing or shortness of breath.   aspirin  EC 81 MG tablet Take 1 tablet (81 mg total) by mouth daily. Swallow whole.   Cholecalciferol  (VITAMIN D ) 2000 units CAPS Take 2,000 Units by mouth daily.   donepezil  (ARICEPT ) 5 MG tablet Take 1 tablet (5 mg total) by mouth every morning.   levothyroxine  (SYNTHROID ) 25 MCG tablet TAKE ONE TABLET BY MOUTH ONE TIME DAILY EXCEPT ON TUESDAYS AND SATURDAYS TAKE TWO TABLETS   omeprazole  (PRILOSEC) 20 MG capsule TAKE ONE CAPSULE BY MOUTH ONE TIME DAILY   traZODone  (DESYREL ) 50 MG tablet Take 0.5-1 tablets (25-50 mg total) by mouth at bedtime as needed for sleep.   atorvastatin  (LIPITOR) 40 MG tablet Take 1 tablet (40 mg total) by mouth daily. (Patient not taking: Reported on 09/17/2023)   Facility-Administered Encounter Medications as of 09/17/2023  Medication   cyanocobalamin  (VITAMIN B12) injection 1,000 mcg    Allergies (verified) Bee venom, Codeine, Iodine, Alendronate, Bacitracin, Iodinated contrast media, and Bactrim   [sulfamethoxazole -trimethoprim ]   History: Past Medical History:  Diagnosis Date   Anemia 03/29/2015   Arthritis    Atrophic vaginitis    Cholelithiasis 2008   Fibrocystic breast disease    bx 2008 neg per pt report   GERD (gastroesophageal reflux disease)    Hearing loss    Hearing loss sensory, bilateral    bil/hearing aids   Hematuria    chronic  Dr. Excell   Hyperlipidemia 11/17/2016   Lupus (systemic lupus erythematosus) (HCC)    Dr. Everlean   Macular degeneration 11/17/2016   Migraine aura without headache    h/o ocular migraines   Osteopenia    Preventative health care 11/18/2016   Renal insufficiency 03/29/2015   Retinal detachment with break    right/laser   Retinal tear of right eye 11/17/2016   Skin lesion of face 01/09/2016   Spinal stenosis    Thyroid  disease    hypothyroidism   Urinary tract bacterial infections    has had urethral dilations  in past   Vitamin B12 deficiency 01/09/2016   Vitamin D  deficiency 07/11/2015   Past Surgical History:  Procedure Laterality Date   APPENDECTOMY  2006   CATARACT EXTRACTION W/ INTRAOCULAR LENS  IMPLANT, BILATERAL     CHOLECYSTECTOMY     EYE SURGERY Bilateral    cataracts   TONSILLECTOMY  1940   VAGINAL HYSTERECTOMY     partial   Family History  Problem Relation Age of Onset   Stroke Paternal Grandmother    Stroke Father    Coronary artery disease Father    Coronary artery disease Mother    Stroke Mother    Cancer Paternal Aunt        GYN cancer   Cancer Paternal Uncle        lung, smoker   Stroke Maternal Grandmother    Stroke Maternal Grandfather    Heart disease Paternal Grandfather    Hydrocephalus Brother        normopressure hydrocephalus, dementia   Social History   Socioeconomic History   Marital status: Widowed    Spouse name: Not on file   Number of children: Not on file   Years of education: 13   Highest education level: Not on file  Occupational History   Not on file  Tobacco Use   Smoking status: Former    Current packs/day: 0.00    Types: Cigarettes    Quit date: 1970    Years since quitting: 55.0   Smokeless tobacco: Never  Substance and Sexual Activity   Alcohol use: Yes    Alcohol/week: 10.0 standard drinks of alcohol    Types: 10 Glasses of wine per week   Drug use: No   Sexual activity: Not Currently    Comment: lives alone here because her daughter lives hier, no dietary restrictions, avoids dairy  Other Topics Concern   Not on file  Social History Narrative   Right hand   Caffeine yes   One story home   Retired   Chief Executive Officer Drivers of Corporate Investment Banker Strain: Low Risk  (09/17/2023)   Overall Financial Resource Strain (CARDIA)    Difficulty of Paying Living Expenses: Not hard at all  Food Insecurity: No Food Insecurity (09/17/2023)   Hunger Vital Sign    Worried About Running Out of Food in the Last Year: Never true    Ran  Out of Food in the Last Year: Never true  Transportation Needs: No Transportation Needs (09/17/2023)   PRAPARE - Administrator, Civil Service (Medical): No    Lack of Transportation (Non-Medical): No  Physical Activity: Inactive (09/17/2023)   Exercise Vital Sign    Days of Exercise per Week: 0 days    Minutes of Exercise per Session: 0 min  Stress: No Stress Concern Present (09/17/2023)   [employer] of Occupational Health - Occupational Stress Questionnaire    Feeling  of Stress : Not at all  Social Connections: Moderately Integrated (09/17/2023)   Social Connection and Isolation Panel [NHANES]    Frequency of Communication with Friends and Family: More than three times a week    Frequency of Social Gatherings with Friends and Family: Three times a week    Attends Religious Services: More than 4 times per year    Active Member of Clubs or Organizations: Yes    Attends Banker Meetings: More than 4 times per year    Marital Status: Widowed    Tobacco Counseling Counseling given: Not Answered   Clinical Intake:  Pre-visit preparation completed: Yes  Pain : No/denies pain  BMI - recorded: 20.85 Nutritional Status: BMI of 19-24  Normal Nutritional Risks: None Diabetes: No  How often do you need to have someone help you when you read instructions, pamphlets, or other written materials from your doctor or pharmacy?: 1 - Never  Interpreter Needed?: No  Information entered by :: Jeoffrey Lease, CMA   Activities of Daily Living    09/17/2023    2:24 PM 12/13/2022   12:29 PM  In your present state of health, do you have any difficulty performing the following activities:  Hearing? 1   Comment wears hearing aids   Vision? 0   Comment wears readers   Difficulty concentrating or making decisions? 0   Walking or climbing stairs? 0   Dressing or bathing? 0   Doing errands, shopping? 0 0  Preparing Food and eating ? N   Using the Toilet? N   In the past  six months, have you accidently leaked urine? N   Do you have problems with loss of bowel control? N   Managing your Medications? N   Managing your Finances? N   Housekeeping or managing your Housekeeping? N     Patient Care Team: Domenica Harlene LABOR, MD as PCP - General (Family Medicine) [place], Tawni CROME, MD as Referring Physician (Dermatology) Carla Milling, RPH-CPP (Pharmacist)  Indicate any recent Medical Services you may have received from other than [facility] providers in the past year (date may be approximate).     Assessment:   This is a routine wellness examination for [place].  Hearing/Vision screen No results found.   Goals Addressed   None    Depression Screen    09/17/2023    2:35 PM 04/06/2023    1:45 PM 03/30/2022    1:44 PM 01/06/2022    3:39 PM 03/24/2021    9:14 AM 03/12/2020    9:48 AM 06/26/2019   10:23 AM  PHQ 2/9 Scores  PHQ - 2 Score 0 0 0 0 0 0 0  PHQ- 9 Score  0     3    Fall Risk    09/17/2023    2:31 PM 07/29/2023    9:09 AM 04/12/2023    3:44 PM 04/06/2023    1:45 PM 03/30/2022    1:40 PM  Fall Risk   Falls in the past year? 0 0 0 0 0  Number falls in past yr: 0 0 0 0 0  Injury with Fall? 0 0 0 0 0  Risk for fall due to : No Fall Risks      Follow up Falls evaluation completed Falls evaluation completed Falls evaluation completed Falls evaluation completed Falls prevention discussed    MEDICARE RISK AT HOME: Medicare Risk at Home Any stairs in or around the home?: Yes (3-4 steps leading to home) If  so, are there any without handrails?: No Home free of loose throw rugs in walkways, pet beds, electrical cords, etc?: Yes Adequate lighting in your home to reduce risk of falls?: Yes Life alert?: No Use of a cane, walker or w/c?: No Grab bars in the bathroom?: Yes Shower chair or bench in shower?: Yes (built in seat) Elevated toilet seat or a handicapped toilet?: No  TIMED UP AND GO:  Was the test performed?  No    Cognitive Function:     07/29/2023   12:00 PM 12/02/2021   12:00 PM 04/24/2021   12:00 PM 09/28/2017    9:30 AM  MMSE - Mini Mental State Exam  Orientation to time 5 5 5 5   Orientation to Place 4 5 5 5   Registration 3 3 3 3   Attention/ Calculation 4 5 2 5   Recall 2 2 2 1   Language- name 2 objects 2 2 2 2   Language- repeat 1 1 1 1   Language- follow 3 step command 1 3 3 3   Language- read & follow direction 1 1 1 1   Write a sentence 1 1 1 1   Copy design 1 1 1 1   Total score 25 29 26 28         09/17/2023    2:38 PM 03/24/2021    9:24 AM 03/12/2020    9:48 AM  6CIT Screen  What Year? 0 points 0 points 0 points  What month? 0 points 0 points 0 points  What time? 0 points 3 points 0 points  Count back from 20 0 points 0 points 0 points  Months in reverse 0 points 0 points 0 points  Repeat phrase 4 points 0 points 0 points  Total Score 4 points 3 points 0 points    Immunizations Immunization History  Administered Date(s) Administered   Fluad Quad(high Dose 65+) 06/15/2019   Influenza Split 07/10/2011, 07/05/2012   Influenza Whole 07/15/2010   Influenza, High Dose Seasonal PF 06/29/2016, 06/29/2017, 06/13/2018, 10/18/2020   Influenza,inj,Quad PF,6+ Mos 06/27/2013, 07/04/2014, 07/11/2015   Moderna Sars-Covid-2 Vaccination 11/09/2019, 12/07/2019, 07/25/2020   Pneumococcal Conjugate-13 07/14/2008   Pneumococcal Polysaccharide-23 07/11/2015   Td 09/15/2007   Tdap 06/22/2018   Zoster, Live 09/14/2006    TDAP status: Up to date  Flu Vaccine status: Due, Education has been provided regarding the importance of this vaccine. Advised may receive this vaccine at local pharmacy or Health Dept. Aware to provide a copy of the vaccination record if obtained from local pharmacy or Health Dept. Verbalized acceptance and understanding.  Pneumococcal vaccine status: Up to date  Covid-19 vaccine status: Information provided on how to obtain vaccines.   Qualifies for Shingles Vaccine? Yes   Zostavax completed Yes    Shingrix Completed?: No.    Education has been provided regarding the importance of this vaccine. Patient has been advised to call insurance company to determine out of pocket expense if they have not yet received this vaccine. Advised may also receive vaccine at local pharmacy or Health Dept. Verbalized acceptance and understanding.  Screening Tests Health Maintenance  Topic Date Due   Zoster Vaccines- Shingrix (1 of 2) 03/08/1950   Medicare Annual Wellness (AWV)  03/31/2023   COVID-19 Vaccine (4 - 2024-25 season) 05/16/2023   INFLUENZA VACCINE  12/13/2023 (Originally 04/15/2023)   DTaP/Tdap/Td (3 - Td or Tdap) 06/22/2028   Pneumonia Vaccine 37+ Years old  Completed   DEXA SCAN  Completed   HPV VACCINES  Aged Out    Health  Maintenance  Health Maintenance Due  Topic Date Due   Zoster Vaccines- Shingrix (1 of 2) 03/08/1950   Medicare Annual Wellness (AWV)  03/31/2023   COVID-19 Vaccine (4 - 2024-25 season) 05/16/2023    Colorectal cancer screening: No longer required.   Mammogram status: No longer required due to age.  Bone Density status: Pt declined.  Lung Cancer Screening: (Low Dose CT Chest recommended if Age 31-80 years, 20 pack-year currently smoking OR have quit w/in 15years.) does not qualify.   Additional Screening:  Hepatitis C Screening: does not qualify  Vision Screening: Recommended annual ophthalmology exams for early detection of glaucoma and other disorders of the eye. Is the patient up to date with their annual eye exam?  No  Who is the provider or what is the name of the office in which the patient attends annual eye exams? Can't remember name at this time If pt is not established with a provider, would they like to be referred to a provider to establish care? No .   Dental Screening: Recommended annual dental exams for proper oral hygiene  Diabetic Foot Exam: N/a  Community Resource Referral / Chronic Care Management: CRR required this visit?  No    CCM required this visit?  No     Plan:     I have personally reviewed and noted the following in the patient's chart:   Medical and social history Use of alcohol, tobacco or illicit drugs  Current medications and supplements including opioid prescriptions. Patient is not currently taking opioid prescriptions. Functional ability and status Nutritional status Physical activity Advanced directives List of other physicians Hospitalizations, surgeries, and ER visits in previous 12 months Vitals Screenings to include cognitive, depression, and falls Referrals and appointments  In addition, I have reviewed and discussed with patient certain preventive protocols, quality metrics, and best practice recommendations. A written personalized care plan for preventive services as well as general preventive health recommendations were provided to patient.     Kandis Gauze, CMA   09/17/2023   After Visit Summary: (MyChart) Due to this being a telephonic visit, the after visit summary with patients personalized plan was offered to patient via MyChart   Nurse Notes: None

## 2023-09-30 ENCOUNTER — Other Ambulatory Visit: Payer: Self-pay | Admitting: Family Medicine

## 2023-09-30 DIAGNOSIS — E079 Disorder of thyroid, unspecified: Secondary | ICD-10-CM

## 2023-10-13 NOTE — Assessment & Plan Note (Signed)
Hydrate and monitor

## 2023-10-13 NOTE — Assessment & Plan Note (Signed)
No recent exacerbations.

## 2023-10-13 NOTE — Assessment & Plan Note (Signed)
Supplement and monitor

## 2023-10-14 ENCOUNTER — Ambulatory Visit (INDEPENDENT_AMBULATORY_CARE_PROVIDER_SITE_OTHER): Payer: Medicare Other | Admitting: Family Medicine

## 2023-10-14 ENCOUNTER — Encounter: Payer: Self-pay | Admitting: Family Medicine

## 2023-10-14 VITALS — BP 124/60 | HR 68 | Temp 98.2°F | Resp 16 | Ht 62.0 in | Wt 117.6 lb

## 2023-10-14 DIAGNOSIS — R252 Cramp and spasm: Secondary | ICD-10-CM | POA: Diagnosis not present

## 2023-10-14 DIAGNOSIS — M545 Low back pain, unspecified: Secondary | ICD-10-CM | POA: Diagnosis not present

## 2023-10-14 DIAGNOSIS — N183 Chronic kidney disease, stage 3 unspecified: Secondary | ICD-10-CM | POA: Diagnosis not present

## 2023-10-14 DIAGNOSIS — E039 Hypothyroidism, unspecified: Secondary | ICD-10-CM

## 2023-10-14 DIAGNOSIS — I5032 Chronic diastolic (congestive) heart failure: Secondary | ICD-10-CM

## 2023-10-14 DIAGNOSIS — E782 Mixed hyperlipidemia: Secondary | ICD-10-CM

## 2023-10-14 DIAGNOSIS — E559 Vitamin D deficiency, unspecified: Secondary | ICD-10-CM

## 2023-10-14 DIAGNOSIS — E538 Deficiency of other specified B group vitamins: Secondary | ICD-10-CM

## 2023-10-14 LAB — CBC WITH DIFFERENTIAL/PLATELET
Basophils Absolute: 0.1 10*3/uL (ref 0.0–0.1)
Basophils Relative: 1.2 % (ref 0.0–3.0)
Eosinophils Absolute: 0.2 10*3/uL (ref 0.0–0.7)
Eosinophils Relative: 3.3 % (ref 0.0–5.0)
HCT: 39.7 % (ref 36.0–46.0)
Hemoglobin: 13 g/dL (ref 12.0–15.0)
Lymphocytes Relative: 17.7 % (ref 12.0–46.0)
Lymphs Abs: 1.2 10*3/uL (ref 0.7–4.0)
MCHC: 32.8 g/dL (ref 30.0–36.0)
MCV: 98.9 fL (ref 78.0–100.0)
Monocytes Absolute: 0.9 10*3/uL (ref 0.1–1.0)
Monocytes Relative: 12.7 % — ABNORMAL HIGH (ref 3.0–12.0)
Neutro Abs: 4.4 10*3/uL (ref 1.4–7.7)
Neutrophils Relative %: 65.1 % (ref 43.0–77.0)
Platelets: 322 10*3/uL (ref 150.0–400.0)
RBC: 4.01 Mil/uL (ref 3.87–5.11)
RDW: 13.8 % (ref 11.5–15.5)
WBC: 6.8 10*3/uL (ref 4.0–10.5)

## 2023-10-14 LAB — COMPREHENSIVE METABOLIC PANEL
ALT: 11 U/L (ref 0–35)
AST: 17 U/L (ref 0–37)
Albumin: 4.3 g/dL (ref 3.5–5.2)
Alkaline Phosphatase: 81 U/L (ref 39–117)
BUN: 19 mg/dL (ref 6–23)
CO2: 26 meq/L (ref 19–32)
Calcium: 9.4 mg/dL (ref 8.4–10.5)
Chloride: 103 meq/L (ref 96–112)
Creatinine, Ser: 1.04 mg/dL (ref 0.40–1.20)
GFR: 47.29 mL/min — ABNORMAL LOW (ref >60.00)
Glucose, Bld: 93 mg/dL (ref 70–99)
Potassium: 5 meq/L (ref 3.5–5.1)
Sodium: 139 meq/L (ref 135–145)
Total Bilirubin: 0.5 mg/dL (ref 0.2–1.2)
Total Protein: 7.1 g/dL (ref 6.0–8.3)

## 2023-10-14 LAB — TSH: TSH: 5.57 u[IU]/mL — ABNORMAL HIGH (ref 0.35–5.50)

## 2023-10-14 LAB — LIPID PANEL
Cholesterol: 239 mg/dL — ABNORMAL HIGH (ref 0–200)
HDL: 112.7 mg/dL (ref >39.00)
LDL Cholesterol: 110 mg/dL — ABNORMAL HIGH (ref 0–99)
NonHDL: 126.78
Total CHOL/HDL Ratio: 2
Triglycerides: 85 mg/dL (ref 0.0–149.0)
VLDL: 17 mg/dL (ref 0.0–40.0)

## 2023-10-14 LAB — MAGNESIUM: Magnesium: 2 mg/dL (ref 1.5–2.5)

## 2023-10-14 LAB — VITAMIN B12: Vitamin B-12: 159 pg/mL — ABNORMAL LOW (ref 211–911)

## 2023-10-14 LAB — VITAMIN D 25 HYDROXY (VIT D DEFICIENCY, FRACTURES): VITD: 23.5 ng/mL — ABNORMAL LOW (ref 30.00–100.00)

## 2023-10-14 NOTE — Patient Instructions (Addendum)
10 ounces of water every 1-2 hours  Protein every 3-4 hours   Sarna lotion  RSV, Respiratory Syncitial Virus Vaccine, Arexvy at pharmacy  Learn something new    Dementia Dementia is a condition that affects the way the brain works. It often affects thinking and memory.  There are many types of dementia, including: Alzheimer's disease. This is the most common type. Vascular dementia. This type may happen due to a stroke. Lewy body dementia. This type may happen to people who have Parkinson's disease. Frontotemporal dementia. This type is caused by damage to nerve cells in certain parts of the brain. Some people may have more than one type. What are the causes? Dementia is caused by damage to cells in the brain. Some causes that can't be reversed include: Having a condition that affects the blood vessels of the brain. This may be diabetes or heart disease. Changes to genes. Some causes that can be reversed or slowed down include: Injury to the brain due to: A growth called a tumor. A blood clot. Too much fluid in the brain. Taking certain medicines. An infection. Problems with your thyroid. Not having enough vitamin B12 in the body. Having a disease that causes your body's defense system, called the immune system, to attack healthy parts of your body. What are the signs or symptoms? Symptoms of dementia start slowly and get worse with time. They may include: Problems remembering events or people. Getting lost easily. Forgetting appointments or to pay bills. Having trouble taking a bath or putting clothes on. Having trouble planning and making meals. Having trouble speaking. Changes in behavior or mood. How is this diagnosed? Dementia may be diagnosed based on: Your symptoms and medical history. A physical exam. Tests. These may include: Tests to check your thinking and memory to see how your brain is working. Lab tests. You may have tests on your blood or pee  (urine). Imaging tests, such as a CT scan, a PET scan, or an MRI. Genetic testing. This may be done if other family members have had dementia. Your health care provider will talk with you and your family, friends, or caregivers about your history and symptoms. How is this treated? Treatment depends on the cause of the dementia and should start as soon as possible. It might include: Taking medicines for symptoms. Taking medicines to help control or slow down the dementia. Treating the cause of your dementia. Your provider can help you find support groups and other members of the health care team who can help with your care. Follow these instructions at home: Medicines Take medicines only as told by your provider. Use a pill organizer or pill reminder to help you keep track of your medicines. Avoid taking medicines for pain or for sleep. These can affect your thinking. Lifestyle Make healthy choices. Be active as told by your provider. Do not smoke, vape, or use products with nicotine or tobacco in them. If you need help quitting, talk with your provider. Do not drink alcohol. When you feel a lot of stress, do something that helps you relax. Your provider can give you tips. Spend time with other people. Make sure you get good sleep at night. These tips can help: Try not to take naps during the day. Keep your bedroom dark and cool. Do not exercise in the few hours before you go to bed. Do not have foods or drinks with caffeine at night. Eating and drinking Drink enough fluid to keep your pee pale yellow.  Eat a healthy diet. General instructions  Talk with your provider to decide on: What things you need help with. What your safety needs are. Ask your provider if it's safe for you to drive. If told, wear a bracelet that tracks where you are or shows that you're a person with memory loss. Work with your family to make big legal or health decisions. This may include things like advance  directives, medical power of attorney, or a living will. Where to find more information Alzheimer's Association: WesternTunes.it [employer] on Aging: BaseRingTones.pl World Health Organization: VisitDestination.com.br Contact a health care provider if: You have any new symptoms. Your symptoms get worse. You have problems with swallowing. Get help right away if: You feel very sad or feel like you may hurt yourself or others. You have thoughts about taking your own life. Your family members are worried about your safety. These symptoms may be an emergency. Take one of these steps right away: Go to your nearest emergency room. Call 911. Call the National Suicide Prevention Lifeline at 331 468 1740 or 988. Text the Crisis Text Line at 3064045111. This information is not intended to replace advice given to you by your health care provider. Make sure you discuss any questions you have with your health care provider. Document Revised: 07/01/2023 Document Reviewed: 11/16/2022 Elsevier Patient Education  2024 [employer].

## 2023-10-15 ENCOUNTER — Other Ambulatory Visit: Payer: Self-pay | Admitting: Emergency Medicine

## 2023-10-15 ENCOUNTER — Encounter: Payer: Self-pay | Admitting: Family Medicine

## 2023-10-15 DIAGNOSIS — E538 Deficiency of other specified B group vitamins: Secondary | ICD-10-CM

## 2023-10-15 DIAGNOSIS — E559 Vitamin D deficiency, unspecified: Secondary | ICD-10-CM

## 2023-10-15 DIAGNOSIS — N183 Chronic kidney disease, stage 3 unspecified: Secondary | ICD-10-CM

## 2023-10-15 DIAGNOSIS — E782 Mixed hyperlipidemia: Secondary | ICD-10-CM

## 2023-10-15 DIAGNOSIS — I5032 Chronic diastolic (congestive) heart failure: Secondary | ICD-10-CM

## 2023-10-15 MED ORDER — LEVOTHYROXINE SODIUM 50 MCG PO TABS: 50.0000 ug | ORAL_TABLET | Freq: Every day | ORAL | 3 refills | Status: DC

## 2023-10-15 MED ORDER — VITAMIN D (ERGOCALCIFEROL) 1.25 MG (50000 UNIT) PO CAPS: 50000.0000 [IU] | ORAL_CAPSULE | ORAL | 12 refills | Status: AC

## 2023-10-17 ENCOUNTER — Encounter: Payer: Self-pay | Admitting: Family Medicine

## 2023-10-17 NOTE — Progress Notes (Signed)
Subjective:    Patient ID: Molly Williams, female    DOB: 08/08/1931, 89 y.o.   MRN: [id number]  Chief Complaint  Patient presents with  . Follow-up    6 months    HPI Discussed the use of AI scribe software for clinical note transcription with the patient, who gave verbal consent to proceed.  History of Present Illness   The patient, on Donepezil for memory issues, reports no side effects from the medication. They have been on the medication since their TIA, which occurred around Easter of the previous year. The patient reports that their memory loss is not a significant handicap to them, as they are still able to drive and carry out their daily activities. However, they acknowledge that their reaction time and ability to manage chaos on the roads may be compromised due to their memory issues. Patient is accompanied by her daughter. she is eating well and stays active.   The patient also reports eczema, which seems to flare up on their shoulder and hip. They have been using creams for the eczema, which provide some relief. The patient also mentions that they have to turn their underwear inside out due to irritation from the elastic, suggesting a possible sensitivity or allergy.  The patient is also on Levothyroxine for thyroid issues. They report no recent illnesses, chest pain, or breathing trouble. They have had their flu shot and are generally in good health.        Past Medical History:  Diagnosis Date  . Anemia 03/29/2015  . Arthritis   . Atrophic vaginitis   . Cholelithiasis 2008  . Eczema   . Fibrocystic breast disease    bx 2008 neg per pt report  . GERD (gastroesophageal reflux disease)   . Hearing loss   . Hearing loss sensory, bilateral    bil/hearing aids  . Hematuria    chronic Dr. Merry Lofty  . Hyperlipidemia 11/17/2016  . Lupus (systemic lupus erythematosus) (HCC)    Dr. Kellie Simmering  . Macular degeneration 11/17/2016  . Migraine aura without headache    h/o  ocular migraines  . Osteopenia   . Preventative health care 11/18/2016  . Renal insufficiency 03/29/2015  . Retinal detachment with break    right/laser  . Retinal tear of right eye 11/17/2016  . Skin lesion of face 01/09/2016  . Spinal stenosis   . Thyroid disease    hypothyroidism  . Urinary tract bacterial infections    has had urethral dilations in past  . Vitamin B12 deficiency 01/09/2016  . Vitamin D deficiency 07/11/2015    Past Surgical History:  Procedure Laterality Date  . APPENDECTOMY  2006  . CATARACT EXTRACTION W/ INTRAOCULAR LENS  IMPLANT, BILATERAL    . CHOLECYSTECTOMY    . EYE SURGERY Bilateral    cataracts  . TONSILLECTOMY  1940  . VAGINAL HYSTERECTOMY     partial    Family History  Problem Relation Age of Onset  . Stroke Paternal Grandmother   . Stroke Father   . Coronary artery disease Father   . Coronary artery disease Mother   . Stroke Mother   . Cancer Paternal Aunt        GYN cancer  . Cancer Paternal Uncle        lung, smoker  . Stroke Maternal Grandmother   . Stroke Maternal Grandfather   . Heart disease Paternal Grandfather   . Hydrocephalus Brother        normopressure hydrocephalus, dementia  Social History   Socioeconomic History  . Marital status: Widowed    Spouse name: Not on file  . Number of children: Not on file  . Years of education: 46  . Highest education level: Not on file  Occupational History  . Not on file  Tobacco Use  . Smoking status: Former    Current packs/day: 0.00    Types: Cigarettes    Quit date: 1970    Years since quitting: 55.1  . Smokeless tobacco: Never  Substance and Sexual Activity  . Alcohol use: Yes    Alcohol/week: 10.0 standard drinks of alcohol    Types: 10 Glasses of wine per week  . Drug use: No  . Sexual activity: Not Currently    Comment: lives alone here because her daughter lives hier, no dietary restrictions, avoids dairy  Other Topics Concern  . Not on file  Social History  Narrative   Right hand   Caffeine yes   One story home   Retired   Teacher, early years/pre Strain: Low Risk  (09/17/2023)   Overall Financial Resource Strain (CARDIA)   . Difficulty of Paying Living Expenses: Not hard at all  Food Insecurity: No Food Insecurity (09/17/2023)   Hunger Vital Sign   . Worried About Programme researcher, broadcasting/film/video in the Last Year: Never true   . Ran Out of Food in the Last Year: Never true  Transportation Needs: No Transportation Needs (10/07/2023)   PRAPARE - Transportation   . Lack of Transportation (Medical): No   . Lack of Transportation (Non-Medical): No  Physical Activity: Inactive (10/07/2023)   Exercise Vital Sign   . Days of Exercise per Week: 0 days   . Minutes of Exercise per Session: 0 min  Stress: No Stress Concern Present (09/17/2023)   [employer] of Occupational Health - Occupational Stress Questionnaire   . Feeling of Stress : Not at all  Social Connections: Moderately Integrated (09/17/2023)   Social Connection and Isolation Panel [NHANES]   . Frequency of Communication with Friends and Family: More than three times a week   . Frequency of Social Gatherings with Friends and Family: Three times a week   . Attends Religious Services: More than 4 times per year   . Active Member of Clubs or Organizations: Yes   . Attends Banker Meetings: More than 4 times per year   . Marital Status: Widowed  Intimate Partner Violence: Not At Risk (09/17/2023)   Humiliation, Afraid, Rape, and Kick questionnaire   . Fear of Current or Ex-Partner: No   . Emotionally Abused: No   . Physically Abused: No   . Sexually Abused: No    Outpatient Medications Prior to Visit  Medication Sig Dispense Refill  . albuterol (VENTOLIN HFA) 108 (90 Base) MCG/ACT inhaler Inhale 2 puffs into the lungs every 6 (six) hours as needed for wheezing or shortness of breath. 18 g 3  . aspirin EC 81 MG tablet Take 1 tablet (81 mg total) by mouth daily.  Swallow whole. 30 tablet 12  . Cholecalciferol (VITAMIN D) 2000 units CAPS Take 2,000 Units by mouth daily.    Marland Kitchen donepezil (ARICEPT) 5 MG tablet Take 1 tablet (5 mg total) by mouth every morning. 30 tablet 11  . omeprazole (PRILOSEC) 20 MG capsule TAKE ONE CAPSULE BY MOUTH ONE TIME DAILY 30 capsule 5  . traZODone (DESYREL) 50 MG tablet Take 0.5-1 tablets (25-50 mg total) by mouth at bedtime  as needed for sleep. 90 tablet 1  . levothyroxine (SYNTHROID) 25 MCG tablet TAKE ONE TABLET BY MOUTH ONE TIME DAILY EXCEPT ON TUESDAYS AND SATURDAYS TAKE TWO TABLETS 108 tablet 3  . atorvastatin (LIPITOR) 40 MG tablet Take 1 tablet (40 mg total) by mouth daily. (Patient not taking: Reported on 10/14/2023) 30 tablet 11   Facility-Administered Medications Prior to Visit  Medication Dose Route Frequency Provider Last Rate Last Admin  . cyanocobalamin (VITAMIN B12) injection 1,000 mcg  1,000 mcg Intramuscular Q30 days Bradd Canary, MD   1,000 mcg at 07/22/22 1008    Allergies  Allergen Reactions  . Bee Venom Anaphylaxis  . Codeine Nausea And Vomiting and Nausea Only  . Iodine Hives    Contrast dye  . Alendronate   . Bacitracin Swelling    Ophthalmic ointment  . Iodinated Contrast Media   . Bactrim  [Sulfamethoxazole-Trimethoprim] Hives, Itching and Rash    Review of Systems  Constitutional:  Negative for fever and malaise/fatigue.  HENT:  Negative for congestion.   Eyes:  Negative for blurred vision.  Respiratory:  Negative for shortness of breath.   Cardiovascular:  Negative for chest pain, palpitations and leg swelling.  Gastrointestinal:  Negative for abdominal pain, blood in stool and nausea.  Genitourinary:  Negative for dysuria and frequency.  Musculoskeletal:  Negative for falls.  Skin:  Positive for rash.  Neurological:  Negative for dizziness, loss of consciousness and headaches.  Endo/Heme/Allergies:  Negative for environmental allergies.  Psychiatric/Behavioral:  Positive for memory  loss. Negative for depression. The patient is not nervous/anxious.        Objective:    Physical Exam Constitutional:      General: She is not in acute distress.    Appearance: Normal appearance. She is well-developed. She is not toxic-appearing.  HENT:     Head: Normocephalic and atraumatic.     Right Ear: External ear normal.     Left Ear: External ear normal.     Nose: Nose normal.  Eyes:     General:        Right eye: No discharge.        Left eye: No discharge.     Conjunctiva/sclera: Conjunctivae normal.  Neck:     Thyroid: No thyromegaly.  Cardiovascular:     Rate and Rhythm: Normal rate and regular rhythm.     Heart sounds: Normal heart sounds. No murmur heard. Pulmonary:     Effort: Pulmonary effort is normal. No respiratory distress.     Breath sounds: Normal breath sounds.  Abdominal:     General: Bowel sounds are normal.     Palpations: Abdomen is soft.     Tenderness: There is no abdominal tenderness. There is no guarding.  Musculoskeletal:        General: Normal range of motion.     Cervical back: Neck supple.  Lymphadenopathy:     Cervical: No cervical adenopathy.  Skin:    General: Skin is warm and dry.  Neurological:     Mental Status: She is alert and oriented to person, place, and time.  Psychiatric:        Mood and Affect: Mood normal.        Behavior: Behavior normal.        Thought Content: Thought content normal.        Judgment: Judgment normal.    BP 124/60 (BP Location: Right Arm, Patient Position: Sitting, Cuff Size: Normal)   Pulse 68  Temp 98.2 F (36.8 C) (Oral)   Resp 16   Ht 5\' 2"  (1.575 m)   Wt 117 lb 9.6 oz (53.3 kg)   SpO2 99%   BMI 21.51 kg/m  Wt Readings from Last 3 Encounters:  10/14/23 117 lb 9.6 oz (53.3 kg)  09/17/23 114 lb (51.7 kg)  07/29/23 121 lb (54.9 kg)    Diabetic Foot Exam - Simple   No data filed    Lab Results  Component Value Date   WBC 6.8 10/14/2023   HGB 13.0 10/14/2023   HCT 39.7  10/14/2023   PLT 322.0 10/14/2023   GLUCOSE 93 10/14/2023   CHOL 239 (H) 10/14/2023   TRIG 85.0 10/14/2023   HDL 112.70 10/14/2023   LDLDIRECT 78.0 04/12/2023   LDLCALC 110 (H) 10/14/2023   ALT 11 10/14/2023   AST 17 10/14/2023   NA 139 10/14/2023   K 5.0 10/14/2023   CL 103 10/14/2023   CREATININE 1.04 10/14/2023   BUN 19 10/14/2023   CO2 26 10/14/2023   TSH 5.57 (H) 10/14/2023   INR 1.1 12/12/2022   HGBA1C 5.6 04/12/2023    Lab Results  Component Value Date   TSH 5.57 (H) 10/14/2023   Lab Results  Component Value Date   WBC 6.8 10/14/2023   HGB 13.0 10/14/2023   HCT 39.7 10/14/2023   MCV 98.9 10/14/2023   PLT 322.0 10/14/2023   Lab Results  Component Value Date   NA 139 10/14/2023   K 5.0 10/14/2023   CO2 26 10/14/2023   GLUCOSE 93 10/14/2023   BUN 19 10/14/2023   CREATININE 1.04 10/14/2023   BILITOT 0.5 10/14/2023   ALKPHOS 81 10/14/2023   AST 17 10/14/2023   ALT 11 10/14/2023   PROT 7.1 10/14/2023   ALBUMIN 4.3 10/14/2023   CALCIUM 9.4 10/14/2023   ANIONGAP 10 12/13/2022   GFR 47.29 (L) 10/14/2023   Lab Results  Component Value Date   CHOL 239 (H) 10/14/2023   Lab Results  Component Value Date   HDL 112.70 10/14/2023   Lab Results  Component Value Date   LDLCALC 110 (H) 10/14/2023   Lab Results  Component Value Date   TRIG 85.0 10/14/2023   Lab Results  Component Value Date   CHOLHDL 2 10/14/2023   Lab Results  Component Value Date   HGBA1C 5.6 04/12/2023       Assessment & Plan:  B12 deficiency Assessment & Plan: Supplement and monitor   Orders: -     Vitamin B12  Chronic diastolic CHF (congestive heart failure) (HCC) Assessment & Plan: No recent exacerbations  Orders: -     CBC with Differential/Platelet -     Lipid panel -     TSH  Stage 3 chronic kidney disease, unspecified whether stage 3a or 3b CKD (HCC) Assessment & Plan: Hydrate and monitor   Orders: -     Comprehensive metabolic panel  Vitamin D  deficiency Assessment & Plan: Supplement and monitor   Orders: -     VITAMIN D 25 Hydroxy (Vit-D Deficiency, Fractures)  Muscle cramps -     Magnesium  Mixed hyperlipidemia -     Lipid panel  Hypothyroidism, unspecified type -     TSH  Low back pain, unspecified back pain laterality, unspecified chronicity, unspecified whether sciatica present    Assessment and Plan    Mild Cognitive Impairment On Donepezil since November. No side effects reported. Discussed the importance of consistent use to slow memory loss.  Patient is still driving short distances without disorientation. -Continue Donepezil. -Encourage learning new activities to stimulate brain function. -Consider discussions with family about driving safety.  Eczema Topical rash on shoulder and hip. No current visible flare-ups. -Recommend over-the-counter Sarna lotion for mild anti-itch relief. -Consider organic clothing options (PACT) to avoid irritation from elastics.  Vitamin D and B12 Deficiency Previous low levels of Vitamin D and B12. Patient is taking supplements but dose is unknown. -Check levels of Vitamin D and B12. -Advise patient to continue current dose if levels are normal.  General Health Maintenance -Order panel of blood work to monitor kidney function and thyroid levels. -Recommend RSV vaccine (Orexvy) to reduce risk of severe respiratory illness. -Encourage increased water intake (10 ounces every 1-2 hours) and regular protein intake. -Advise patient to limit public outings for the next 3-4 weeks due to high levels of respiratory illnesses. -Follow-up in the summer unless patient needs sooner.         Danise Edge, MD

## 2023-10-24 ENCOUNTER — Emergency Department (HOSPITAL_BASED_OUTPATIENT_CLINIC_OR_DEPARTMENT_OTHER): Payer: Medicare Other

## 2023-10-24 ENCOUNTER — Other Ambulatory Visit: Payer: Self-pay

## 2023-10-24 ENCOUNTER — Emergency Department (HOSPITAL_BASED_OUTPATIENT_CLINIC_OR_DEPARTMENT_OTHER)
Admission: EM | Admit: 2023-10-24 | Discharge: 2023-10-24 | Disposition: A | Discharge status: Home / Self Care | Payer: Medicare Other | Attending: Emergency Medicine | Admitting: Emergency Medicine

## 2023-10-24 ENCOUNTER — Encounter (HOSPITAL_BASED_OUTPATIENT_CLINIC_OR_DEPARTMENT_OTHER): Payer: Self-pay

## 2023-10-24 DIAGNOSIS — W1839XA Other fall on same level, initial encounter: Secondary | ICD-10-CM | POA: Insufficient documentation

## 2023-10-24 DIAGNOSIS — S065X0A Traumatic subdural hemorrhage without loss of consciousness, initial encounter: Secondary | ICD-10-CM | POA: Insufficient documentation

## 2023-10-24 DIAGNOSIS — G319 Degenerative disease of nervous system, unspecified: Secondary | ICD-10-CM | POA: Diagnosis not present

## 2023-10-24 DIAGNOSIS — Y92002 Bathroom of unspecified non-institutional (private) residence single-family (private) house as the place of occurrence of the external cause: Secondary | ICD-10-CM | POA: Insufficient documentation

## 2023-10-24 DIAGNOSIS — S0990XA Unspecified injury of head, initial encounter: Secondary | ICD-10-CM | POA: Diagnosis present

## 2023-10-24 DIAGNOSIS — E039 Hypothyroidism, unspecified: Secondary | ICD-10-CM | POA: Diagnosis not present

## 2023-10-24 DIAGNOSIS — M4802 Spinal stenosis, cervical region: Secondary | ICD-10-CM | POA: Diagnosis not present

## 2023-10-24 DIAGNOSIS — M50222 Other cervical disc displacement at C5-C6 level: Secondary | ICD-10-CM | POA: Diagnosis not present

## 2023-10-24 DIAGNOSIS — R270 Ataxia, unspecified: Secondary | ICD-10-CM | POA: Diagnosis not present

## 2023-10-24 DIAGNOSIS — M47812 Spondylosis without myelopathy or radiculopathy, cervical region: Secondary | ICD-10-CM | POA: Diagnosis not present

## 2023-10-24 DIAGNOSIS — R9082 White matter disease, unspecified: Secondary | ICD-10-CM | POA: Diagnosis not present

## 2023-10-24 DIAGNOSIS — S199XXA Unspecified injury of neck, initial encounter: Secondary | ICD-10-CM | POA: Diagnosis not present

## 2023-10-24 DIAGNOSIS — S065XAA Traumatic subdural hemorrhage with loss of consciousness status unknown, initial encounter: Secondary | ICD-10-CM

## 2023-10-24 DIAGNOSIS — Z8673 Personal history of transient ischemic attack (TIA), and cerebral infarction without residual deficits: Secondary | ICD-10-CM | POA: Insufficient documentation

## 2023-10-24 LAB — COMPREHENSIVE METABOLIC PANEL
ALT: 15 U/L (ref 0–44)
AST: 23 U/L (ref 15–41)
Albumin: 3.9 g/dL (ref 3.5–5.0)
Alkaline Phosphatase: 75 U/L (ref 38–126)
Anion gap: 7 (ref 5–15)
BUN: 17 mg/dL (ref 8–23)
CO2: 23 mmol/L (ref 22–32)
Calcium: 9.2 mg/dL (ref 8.9–10.3)
Chloride: 106 mmol/L (ref 98–111)
Creatinine, Ser: 0.95 mg/dL (ref 0.44–1.00)
GFR, Estimated: 57 mL/min — ABNORMAL LOW (ref >60)
Glucose, Bld: 99 mg/dL (ref 70–99)
Potassium: 3.7 mmol/L (ref 3.5–5.1)
Sodium: 136 mmol/L (ref 135–145)
Total Bilirubin: 1 mg/dL (ref 0.0–1.2)
Total Protein: 7.2 g/dL (ref 6.5–8.1)

## 2023-10-24 LAB — CBC WITH DIFFERENTIAL/PLATELET
Abs Immature Granulocytes: 0.03 10*3/uL (ref 0.00–0.07)
Basophils Absolute: 0 10*3/uL (ref 0.0–0.1)
Basophils Relative: 0 %
Eosinophils Absolute: 0.2 10*3/uL (ref 0.0–0.5)
Eosinophils Relative: 2 %
HCT: 36 % (ref 36.0–46.0)
Hemoglobin: 12.1 g/dL (ref 12.0–15.0)
Immature Granulocytes: 0 %
Lymphocytes Relative: 7 %
Lymphs Abs: 0.6 10*3/uL — ABNORMAL LOW (ref 0.7–4.0)
MCH: 32.4 pg (ref 26.0–34.0)
MCHC: 33.6 g/dL (ref 30.0–36.0)
MCV: 96.3 fL (ref 80.0–100.0)
Monocytes Absolute: 0.9 10*3/uL (ref 0.1–1.0)
Monocytes Relative: 10 %
Neutro Abs: 7.2 10*3/uL (ref 1.7–7.7)
Neutrophils Relative %: 81 %
Platelets: 274 10*3/uL (ref 150–400)
RBC: 3.74 MIL/uL — ABNORMAL LOW (ref 3.87–5.11)
RDW: 13.3 % (ref 11.5–15.5)
WBC: 8.9 10*3/uL (ref 4.0–10.5)
nRBC: 0 % (ref 0.0–0.2)

## 2023-10-24 LAB — MAGNESIUM: Magnesium: 1.8 mg/dL (ref 1.7–2.4)

## 2023-10-24 MED ORDER — ACETAMINOPHEN 500 MG PO TABS
1000.0000 mg | ORAL_TABLET | Freq: Once | ORAL | Status: AC
  Administered 2023-10-24: 1000 mg via ORAL
  Filled 2023-10-24: qty 2

## 2023-10-24 MED ORDER — GABAPENTIN 300 MG PO CAPS: ORAL_CAPSULE | ORAL | 0 refills | Status: DC

## 2023-10-24 NOTE — ED Notes (Signed)
 Spoke with answering service regarding 2nd consult with Dr Maritza Sidles, on call for Neurosurgery

## 2023-10-24 NOTE — ED Notes (Signed)
 EDP verbal order to remove c collar

## 2023-10-24 NOTE — ED Provider Notes (Signed)
  Physical Exam  BP (!) 159/69   Pulse 71   Temp 98.6 F (37 C) (Oral)   Resp 20   Ht 5' 2 (1.575 m)   Wt 54.8 kg   SpO2 94%   BMI 22.09 kg/m   Physical Exam  Procedures  Procedures  ED Course / MDM   Clinical Course as of 10/24/23 1537  Sun Oct 24, 2023  0907 I received a call from radiology head CT showed a small subdural hematoma in the anterior left falx.  CT cervical spine without acute traumatic injury however she is continuing to have a burning pain in her hands.  Neurosurgery was consulted.  MRI of her neck is ordered. [VK]  [id number] I spoke with Camie Blue PA with neurosurgery, recommended repeat CTH in 6 hrs, hold aspirin  for 2 weeks. Ok for discharge if CTH unchanged from [facility] stand point. Will follow up additional recs pending MRI. [VK]  1201 MRI with moderate canal stenosis without cord signal abnormality at C5/C6 as well as multiple areas of foraminal stenosis. With multiple symptoms will discuss with neurosurgery regarding management. [VK]  1235 I spoke with Camie with neurosurgery who stated she does not need collar and can follow up outpatient. [VK]  1501 Assumed care from Dr. Ellouise.  89 year old female on aspirin  who presented to the emergency department after a fall.  Did strike her head.  Does have an 8 mm subdural hematoma without any shift.  Has paresthesias of her arms.  Has CT of the cervical spine that did not show acute abnormalities.  MRI of the cervical spine without cord signal change.  Discussed with neurosurgery who recommends repeat CT head and if unchanged subdural hematoma size can be discharged with outpatient follow-up.  They did not want Keppra for the patient. [RP]  1536 Patient reassessed.  Still complaining of tingling in her hands.  Patient was instructed to hold her aspirin  for 2 weeks.  Also instructed to take Tylenol  for pain and given Lyrica for the paresthesias that she is having in her hands.  Will have her follow-up with neurosurgery in 1  week. [RP]    Clinical Course User Index [RP] Yolande Lamar BROCKS, MD [VK] Kingsley, Victoria K, DO   Medical Decision Making Amount and/or Complexity of Data Reviewed Labs: ordered. Radiology: ordered.  Risk OTC drugs. Prescription drug management.      Yolande Lamar BROCKS, MD 10/24/23 1537

## 2023-10-24 NOTE — Discharge Instructions (Addendum)
 You were seen for your traumatic brain injury (subdural hematoma) in the emergency department.   At home, please take Tylenol  for pain.  Start taking gabapentin  for the tingling in your hands.  This medication can make you drowsy so if you are too tired or dizzy please stop taking the medication.  Do not take your aspirin  for 2 weeks.  Check your MyChart online for the results of any tests that had not resulted by the time you left the emergency department.   Follow-up with your primary doctor in 2-3 days regarding your visit.  Follow-up with neurosurgery (Dr. Debby) in 1 week.  Return immediately to the emergency department if you experience any of the following: Weakness of your arms or legs, bowel or bladder incontinence, numbness between your legs, or any other concerning symptoms.    Thank you for visiting our Emergency Department. It was a pleasure taking care of you today.

## 2023-10-24 NOTE — ED Triage Notes (Signed)
 Pt reports to ED this am after falling and hitting her head. States that she has had some diarrhea the last few days and had the urge to use the restroom and fell after getting out of bed quickly. Pt is on 81mg  Aspirin  daily.

## 2023-10-24 NOTE — ED Provider Notes (Signed)
 [place] [facility] Provider Note   CSN: [id number] Arrival date & time: 10/24/23  0645     History  Chief Complaint  Patient presents with   Molly Williams is a 89 y.o. female.  Patient is a 89 year old female with a past medical history of hypothyroidism, TIA who presented to the emergency department after a fall.  Patient reports that she has been sick with a diarrheal illness all day yesterday.  She states that she took Imodium with improvement.  She states that she woke up around 5 AM this morning feeling like she needed to use the bathroom and when she tried to get out of bed she accidentally rolled and hit her head on the nightstand which had caused her to fall.  She states that since then she has had a severe burning and tingling sensation in her bilateral hands and that her hands feel weak.  She denies any headache or neck pain, nausea or vomiting.  She denies any other pain or injuries from the fall.  She denies any blood thinner use.  She denies feeling dizzy prior to the fall however her daughter is concerned she may be dehydrated from the diarrhea.  The history is provided by the patient and a relative.  Fall       Home Medications Prior to Admission medications   Medication Sig Start Date End Date Taking? Authorizing Provider  albuterol  (VENTOLIN  HFA) 108 (90 Base) MCG/ACT inhaler Inhale 2 puffs into the lungs every 6 (six) hours as needed for wheezing or shortness of breath. 12/09/20   Domenica Harlene LABOR, MD  aspirin  EC 81 MG tablet Take 1 tablet (81 mg total) by mouth daily. Swallow whole. 12/14/22   Krishnan, Sendil K, MD  atorvastatin  (LIPITOR) 40 MG tablet Take 1 tablet (40 mg total) by mouth daily. Patient not taking: Reported on 10/14/2023 12/14/22   Krishnan, Sendil K, MD  Cholecalciferol  (VITAMIN D ) 2000 units CAPS Take 2,000 Units by mouth daily.    [provider]  donepezil  (ARICEPT ) 5 MG tablet Take 1 tablet  (5 mg total) by mouth every morning. 07/29/23   Wertman, Sara E, PA-C  levothyroxine  (SYNTHROID ) 50 MCG tablet Take 1 tablet (50 mcg total) by mouth daily. 10/15/23   Domenica Harlene LABOR, MD  omeprazole  (PRILOSEC) 20 MG capsule TAKE ONE CAPSULE BY MOUTH ONE TIME DAILY 07/01/23   Domenica Harlene LABOR, MD  traZODone  (DESYREL ) 50 MG tablet Take 0.5-1 tablets (25-50 mg total) by mouth at bedtime as needed for sleep. 06/29/23   Domenica Harlene LABOR, MD  Vitamin D , Ergocalciferol , (DRISDOL ) 1.25 MG (50000 UNIT) CAPS capsule Take 1 capsule (50,000 Units total) by mouth every 7 (seven) days. 10/15/23   Domenica Harlene LABOR, MD      Allergies    Bee venom, Codeine, Iodine, Alendronate, Bacitracin, Iodinated contrast media, and Bactrim   [sulfamethoxazole -trimethoprim ]    Review of Systems   Review of Systems  Physical Exam Updated Vital Signs BP (!) 159/69   Pulse 71   Temp 98.6 F (37 C) (Oral)   Resp 20   Ht 5' 2 (1.575 m)   Wt 54.8 kg   SpO2 94%   BMI 22.09 kg/m  Physical Exam Vitals and nursing note reviewed.  Constitutional:      General: She is not in acute distress.    Appearance: Normal appearance.  HENT:     Head: Normocephalic.     Nose:  Comments: Non-bleeding abrasion to nasal bridge    Mouth/Throat:     Mouth: Mucous membranes are moist.     Pharynx: Oropharynx is clear.  Eyes:     Extraocular Movements: Extraocular movements intact.     Conjunctiva/sclera: Conjunctivae normal.     Pupils: Pupils are equal, round, and reactive to light.  Neck:     Comments: No midline neck tenderness Cardiovascular:     Rate and Rhythm: Normal rate and regular rhythm.     Pulses: Normal pulses.     Heart sounds: Normal heart sounds.  Pulmonary:     Effort: Pulmonary effort is normal.     Breath sounds: Normal breath sounds.  Abdominal:     General: Abdomen is flat.     Palpations: Abdomen is soft.     Tenderness: There is no abdominal tenderness.  Musculoskeletal:        General: Normal range  of motion.     Cervical back: Normal range of motion and neck supple.     Comments: No midline back tenderness, no bony tenderness of bilateral upper or lower extremities, pelvis stable and nontender  Skin:    General: Skin is warm and dry.  Neurological:     General: No focal deficit present.     Mental Status: She is alert and oriented to person, place, and time.     Comments: Hyperesthesia to bilateral hands  5/5 bilateral grip strength but significant pain with grip 5/5 bilateral elbow flexion/extension, shoulder abduction 5/5 bilateral hip flexion, plantar/dorsiflexion  Psychiatric:        Mood and Affect: Mood normal.        Behavior: Behavior normal.     ED Results / Procedures / Treatments   Labs (all labs ordered are listed, but only abnormal results are displayed) Labs Reviewed  COMPREHENSIVE METABOLIC PANEL - Abnormal; Notable for the following components:      Result Value   GFR, Estimated 57 (*)    All other components within normal limits  CBC WITH DIFFERENTIAL/PLATELET - Abnormal; Notable for the following components:   RBC 3.74 (*)    Lymphs Abs 0.6 (*)    All other components within normal limits  MAGNESIUM    EKG None  Radiology MR Cervical Spine Wo Contrast Result Date: 10/24/2023 CLINICAL DATA:  Ataxia. Cervical trauma. Evaluate for central cord abnormality. EXAM: MRI CERVICAL SPINE WITHOUT CONTRAST TECHNIQUE: Multiplanar, multisequence MR imaging of the cervical spine was performed. No intravenous contrast was administered. COMPARISON:  CT of the cervical spine 10/24/2023 FINDINGS: Alignment: 3 mm anterolisthesis present at C3-4. Slight retrolisthesis is present at C6-7. Straightening of the normal cervical lordosis is again seen. Mild levoconvex curvature is centered at C6-7. Vertebrae: Mild edematous endplate marrow changes are present posteriorly at C5-6. Scattered fatty infiltration is present throughout the cervical and upper thoracic spine. The  vertebral body heights are maintained. Cord: The ventral spinal cord is flattened on the left at C5-6. No abnormal cord signal is present. Cord signal and morphology is otherwise within normal limits in the cervical and upper thoracic spine. Posterior Fossa, vertebral arteries, paraspinal tissues: Craniocervical junction is normal. Flow is present in the vertebral arteries bilaterally. Visualized intracranial contents are normal. Disc levels: C2-3: Moderate facet hypertrophy is worse right than left. Mild right foraminal narrowing is present. C3-4: A broad-based disc osteophyte complex is present. Partial effacement of the ventral CSF is present. Moderate right and mild left foraminal stenosis is present. C4-5: A leftward disc  osteophyte complex is present. Partial effacement of the ventral CSF is noted. Facet hypertrophy is worse right than left. Moderate foraminal narrowing is present bilaterally. C5-6: A leftward broad-based disc protrusion present. This distorts the left ventral surface cord. The canal is narrowed 6 mm in the midline. Severe left and moderate right foraminal stenosis is present. C6-7: A broad-based disc osteophyte complex is asymmetric to the left. Moderate to severe foraminal narrowing is present bilaterally, left greater than right. Partial effacement of ventral CSF is present. C7-T1: Asymmetric left-sided uncovertebral spurring leads to mild left foraminal stenosis. IMPRESSION: 1. Multilevel spondylosis of the cervical spine as described. 2. Moderate left central canal stenosis C5-6. The canal is narrowed to 6 mm in the midline. No abnormal cord signal is present. 3. Severe left and moderate right foraminal stenosis at C5-6. 4. Mild right foraminal narrowing at C2-3. 5. Moderate right and mild left foraminal stenosis at C3-4. 6. Moderate foraminal narrowing bilaterally at C4-5. 7. Moderate to severe foraminal narrowing bilaterally at C6-7, left greater than right. 8. Mild left foraminal  stenosis at C7-T1. Electronically Signed   By: Lonni Necessary M.D.   On: 10/24/2023 11:45   CT Head Wo Contrast Result Date: 10/24/2023 CLINICAL DATA:  Fall with head injury. EXAM: CT HEAD WITHOUT CONTRAST CT CERVICAL SPINE WITHOUT CONTRAST TECHNIQUE: Multidetector CT imaging of the head and cervical spine was performed following the standard protocol without intravenous contrast. Multiplanar CT image reconstructions of the cervical spine were also generated. RADIATION DOSE REDUCTION: This exam was performed according to the departmental dose-optimization program which includes automated exposure control, adjustment of the mA and/or kV according to patient size and/or use of iterative reconstruction technique. COMPARISON:  Head CT 12/12/2022 FINDINGS: CT HEAD FINDINGS Brain: Tiny acute subdural hemorrhagic focus identified along the left anterior falx (axial 23/3 and coronal 51/601) measures about 8 mm. This finding is new since head CT 12/12/2022. No midline shift or hydrocephalus. No definite CT evidence for acute infarction. Diffuse loss of parenchymal volume is consistent with atrophy. Patchy low attenuation in the deep hemispheric and periventricular white matter is nonspecific, but likely reflects chronic microvascular ischemic demyelination. Vascular: No hyperdense vessel or unexpected calcification. Skull: No evidence for fracture. No worrisome lytic or sclerotic lesion. Sinuses/Orbits: The visualized paranasal sinuses and mastoid air cells are clear. Visualized portions of the globes and intraorbital fat are unremarkable. Other: None. CT CERVICAL SPINE FINDINGS Alignment: Reversal of normal cervical lordosis without evidence for traumatic subluxation. Skull base and vertebrae: No acute fracture. No primary bone lesion or focal pathologic process. Soft tissues and spinal canal: No prevertebral fluid or swelling. No visible canal hematoma. Disc levels: Loss of disc height noted C3-4, C5-6 and C6-7  with endplate spurring. Facet arthropathy in the mid cervical levels, right greater than left. Upper chest: Pleuroparenchymal scarring noted left apex. Other: None IMPRESSION: 1. 8 mm acute subdural hemorrhagic focus along the left anterior falx. No midline shift or hydrocephalus. 2. Atrophy with chronic small vessel white matter ischemic disease. 3. No evidence for cervical spine fracture or traumatic subluxation. 4. Cervical degenerative disc disease and facet arthropathy. Critical Value/emergent results were called by telephone at the time of interpretation on 10/24/2023 at 8:38 am to provider RICHERD LATER, DO , who verbally acknowledged these results. Electronically Signed   By: Camellia Candle M.D.   On: 10/24/2023 08:38   CT Cervical Spine Wo Contrast Result Date: 10/24/2023 CLINICAL DATA:  Fall with head injury. EXAM: CT HEAD WITHOUT CONTRAST CT  CERVICAL SPINE WITHOUT CONTRAST TECHNIQUE: Multidetector CT imaging of the head and cervical spine was performed following the standard protocol without intravenous contrast. Multiplanar CT image reconstructions of the cervical spine were also generated. RADIATION DOSE REDUCTION: This exam was performed according to the departmental dose-optimization program which includes automated exposure control, adjustment of the mA and/or kV according to patient size and/or use of iterative reconstruction technique. COMPARISON:  Head CT 12/12/2022 FINDINGS: CT HEAD FINDINGS Brain: Tiny acute subdural hemorrhagic focus identified along the left anterior falx (axial 23/3 and coronal 51/601) measures about 8 mm. This finding is new since head CT 12/12/2022. No midline shift or hydrocephalus. No definite CT evidence for acute infarction. Diffuse loss of parenchymal volume is consistent with atrophy. Patchy low attenuation in the deep hemispheric and periventricular white matter is nonspecific, but likely reflects chronic microvascular ischemic demyelination. Vascular: No  hyperdense vessel or unexpected calcification. Skull: No evidence for fracture. No worrisome lytic or sclerotic lesion. Sinuses/Orbits: The visualized paranasal sinuses and mastoid air cells are clear. Visualized portions of the globes and intraorbital fat are unremarkable. Other: None. CT CERVICAL SPINE FINDINGS Alignment: Reversal of normal cervical lordosis without evidence for traumatic subluxation. Skull base and vertebrae: No acute fracture. No primary bone lesion or focal pathologic process. Soft tissues and spinal canal: No prevertebral fluid or swelling. No visible canal hematoma. Disc levels: Loss of disc height noted C3-4, C5-6 and C6-7 with endplate spurring. Facet arthropathy in the mid cervical levels, right greater than left. Upper chest: Pleuroparenchymal scarring noted left apex. Other: None IMPRESSION: 1. 8 mm acute subdural hemorrhagic focus along the left anterior falx. No midline shift or hydrocephalus. 2. Atrophy with chronic small vessel white matter ischemic disease. 3. No evidence for cervical spine fracture or traumatic subluxation. 4. Cervical degenerative disc disease and facet arthropathy. Critical Value/emergent results were called by telephone at the time of interpretation on 10/24/2023 at 8:38 am to provider RICHERD LATER, DO , who verbally acknowledged these results. Electronically Signed   By: Camellia Candle M.D.   On: 10/24/2023 08:38    Procedures .Critical Care  Performed by: Kingsley, Yvonda Fouty K, DO Authorized by: Later Richerd POUR, DO   Critical care provider statement:    Critical care time (minutes):  30   Critical care was necessary to treat or prevent imminent or life-threatening deterioration of the following conditions:  CNS failure or compromise and trauma   Critical care was time spent personally by me on the following activities:  Development of treatment plan with patient or surrogate, discussions with consultants, evaluation of patient's response to  treatment, examination of patient, ordering and review of laboratory studies, ordering and review of radiographic studies, ordering and performing treatments and interventions, pulse oximetry, re-evaluation of patient's condition and review of old charts     Medications Ordered in ED Medications  acetaminophen  (TYLENOL ) tablet 1,000 mg (1,000 mg Oral Given 10/24/23 0905)    ED Course/ Medical Decision Making/ A&P Clinical Course as of 10/24/23 1504  Sun Oct 24, 2023  0907 I received a call from radiology head CT showed a small subdural hematoma in the anterior left falx.  CT cervical spine without acute traumatic injury however she is continuing to have a burning pain in her hands.  Neurosurgery was consulted.  MRI of her neck is ordered. [VK]  [id number] I spoke with Camie Blue PA with neurosurgery, recommended repeat CTH in 6 hrs, hold aspirin  for 2 weeks. Ok for discharge if CTH unchanged from  SDH stand point. Will follow up additional recs pending MRI. [VK]  1201 MRI with moderate canal stenosis without cord signal abnormality at C5/C6 as well as multiple areas of foraminal stenosis. With multiple symptoms will discuss with neurosurgery regarding management. [VK]  1235 I spoke with Camie with neurosurgery who stated she does not need collar and can follow up outpatient. [VK]  1501 Assumed care from Dr. Ellouise.  89 year old female on aspirin  who presented to the emergency department after a fall.  Did strike her head.  Does have Molly 8 mm subdural hematoma without any shift.  Has paresthesias of her arms.  Has CT of the cervical spine that did not show acute abnormalities.  MRI of the cervical spine without cord signal change.  Discussed with neurosurgery who recommends repeat CT head and if unchanged subdural hematoma size can be discharged with outpatient follow-up.  They did not want Keppra for the patient. [RP]    Clinical Course User Index [RP] Yolande Lamar BROCKS, MD [VK] Kingsley, Jedidiah Demartini  K, DO                                 Medical Decision Making This patient presents to the ED with chief complaint(s) of fall with pertinent past medical history of hypothyroidism, TIA which further complicates the presenting complaint. The complaint involves Molly extensive differential diagnosis and also carries with it a high risk of complications and morbidity.    The differential diagnosis includes ICH, mass effect, cervical spine fracture, central cord syndrome, dehydration, electrolyte abnormality, no other traumatic injury seen on exam  Additional history obtained: Additional history obtained from family Records reviewed Primary Care Documents  ED Course and Reassessment: On patient's arrival she is hemodynamically stable in no acute distress.  The patient's subjective weakness and decreased sensation of bilateral hands, concern for central cord with c-collar was placed.  The patient will of CT head and C-spine as well as labs in the setting of her recent diarrheal illness to evaluate for dehydration or electrolyte derangement and she will be closely reassessed.  Independent labs interpretation:  The following labs were independently interpreted: within normal range  Independent visualization of imaging: - I independently visualized the following imaging with scope of interpretation limited to determining acute life threatening conditions related to emergency care: CTH/neck, MRI c-spine, which revealed small SDH, spinal stenosis without cord abnormality  Consultation: - Consulted or discussed management/test interpretation w/ external professional: neurosurgery    Amount and/or Complexity of Data Reviewed Labs: ordered. Radiology: ordered.  Risk OTC drugs.          Final Clinical Impression(s) / ED Diagnoses Final diagnoses:  SDH (subdural hematoma) (HCC)  Cervical stenosis of spinal canal    Rx / DC Orders ED Discharge Orders     None         Kingsley,  Yerachmiel Spinney K, DO 10/24/23 1504

## 2023-10-29 DIAGNOSIS — S065XAA Traumatic subdural hemorrhage with loss of consciousness status unknown, initial encounter: Secondary | ICD-10-CM | POA: Diagnosis not present

## 2023-10-29 DIAGNOSIS — M4722 Other spondylosis with radiculopathy, cervical region: Secondary | ICD-10-CM | POA: Diagnosis not present

## 2023-11-09 ENCOUNTER — Ambulatory Visit: Payer: Self-pay | Admitting: Family Medicine

## 2023-11-09 NOTE — Telephone Encounter (Signed)
 Patient scheduled in 2 days. FYI

## 2023-11-09 NOTE — Telephone Encounter (Signed)
 Copied from CRM [id number]. Topic: Clinical - Red Word Triage >> Nov 09, 2023 11:16 AM Almira Coaster wrote: Red Word that prompted transfer to Nurse Triage: Patient is experiencing irritable bowel syndrome which caused her to fall and hitting her head on the night stand on her way to the rest room. She was seen at the ER and an MRI was done which showed slight bleeding in the brain. Patient states the irritable bowel syndrome are severe.   Chief Complaint: Recurrent Diarrhea Frequency: Ongoing for years  Pertinent Negatives: Patient denies abdominal, nausea, vomiting Disposition: [] ED /[] Urgent Care (no appt availability in office) / [x] Appointment(In office/virtual)/ []  [facility]/ [] Home Care/ [] Refused Recommended Disposition /[] [place] [facility]/ []  Follow-up with PCP Additional Notes: Patient stated she has been having IBS for years and she gets random episodes of diarrhea. Recently, she had an episode of diarrhea that caused her to run to the bathroom leading to a fall and head injury. Patient was seen in the ED on 2/9 due to this situation. Patient stated she is healing fine from the injury. She has a small hematoma and was referred to Neurosurgery.   Patient called today because she is concerned about the recurrent diarrhea episodes and she wants to discuss long term treatment options. She had one episode of diarrhea today. Patient stated she has not been evaluated for IBS by her PCP. Appointment scheduled for 2/27.   Reason for Disposition  Diarrhea is a chronic symptom (recurrent or ongoing AND present > 4 weeks)  Answer Assessment - Initial Assessment Questions 1. DIARRHEA SEVERITY: "How bad is the diarrhea?" "How many more stools have you had in the past 24 hours than normal?"    - NO DIARRHEA (SCALE 0)   - MILD (SCALE 1-3): Few loose or mushy BMs; increase of 1-3 stools over normal daily number of stools; mild increase in ostomy output.   -  MODERATE (SCALE 4-7): Increase  of 4-6 stools daily over normal; moderate increase in ostomy output.   -  SEVERE (SCALE 8-10; OR "WORST POSSIBLE"): Increase of 7 or more stools daily over normal; moderate increase in ostomy output; incontinence.     1 episode of diarrhea today  2. ONSET: "When did the diarrhea begin?"      Patient stated that she has been having this problem for years  3. VOMITING: "Are you also vomiting?" If Yes, ask: "How many times in the past 24 hours?"      No  4. ABDOMEN PAIN: "Are you having any abdomen pain?" If Yes, ask: "What does it feel like?" (e.g., crampy, dull, intermittent, constant)      No   5. ABDOMEN PAIN SEVERITY: If present, ask: "How bad is the pain?"  (e.g., Scale 1-10; mild, moderate, or severe)   - MILD (1-3): doesn't interfere with normal activities, abdomen soft and not tender to touch    - MODERATE (4-7): interferes with normal activities or awakens from sleep, abdomen tender to touch    - SEVERE (8-10): excruciating pain, doubled over, unable to do any normal activities       No pain  6. OTHER SYMPTOMS: "Do you have any other symptoms?" (e.g., fever, blood in stool)      Patient denies having any other symptoms  Protocols used: Diarrhea-A-AH

## 2023-11-11 ENCOUNTER — Ambulatory Visit (INDEPENDENT_AMBULATORY_CARE_PROVIDER_SITE_OTHER): Payer: Medicare Other | Admitting: Family Medicine

## 2023-11-11 ENCOUNTER — Encounter: Payer: Self-pay | Admitting: Family Medicine

## 2023-11-11 VITALS — BP 150/60 | HR 64 | Temp 98.3°F | Resp 16 | Ht 62.0 in | Wt 119.6 lb

## 2023-11-11 DIAGNOSIS — E559 Vitamin D deficiency, unspecified: Secondary | ICD-10-CM

## 2023-11-11 DIAGNOSIS — I10 Essential (primary) hypertension: Secondary | ICD-10-CM

## 2023-11-11 DIAGNOSIS — E782 Mixed hyperlipidemia: Secondary | ICD-10-CM

## 2023-11-11 DIAGNOSIS — N183 Chronic kidney disease, stage 3 unspecified: Secondary | ICD-10-CM | POA: Diagnosis not present

## 2023-11-11 DIAGNOSIS — R739 Hyperglycemia, unspecified: Secondary | ICD-10-CM | POA: Diagnosis not present

## 2023-11-11 MED ORDER — GABAPENTIN 300 MG PO CAPS: ORAL_CAPSULE | ORAL | 3 refills | Status: DC

## 2023-11-11 NOTE — Progress Notes (Signed)
 Subjective:    Patient ID: Molly Williams, female    DOB: [birth year], 89 y.o.   MRN: [id number]  Chief Complaint  Patient presents with   Diarrhea    Would like a medication refill    HPI Discussed the use of AI scribe software for clinical note transcription with the patient, who gave verbal consent to proceed.  History of Present Illness Molly Williams is a 89 year old female who presents with episodes of diarrhea causing falls.  She experiences sporadic episodes of diarrhea approximately one to two days per week, which have been increasing in frequency over the past year. During these episodes, she needs to stay close to a bathroom for the entire day. There is no identifiable pattern or dietary trigger for these episodes. Recently, an episode caused her to fall, prompting her to seek medical advice. Historically, she has been more prone to constipation, but this has changed recently. She has tried using Imodium, which provides temporary relief, but the symptoms often return the following day. She has not been using any fiber supplements like Metamucil or Benefiber.  Her current medications include gabapentin, levothyroxine, donepezil, and vitamin D. She has previously taken vitamin B12 and plans to resume it. She consumes a diet rich in leafy greens, which she believes benefits her eye health due to macular degeneration.  She moved from [place] to [place] 15 years ago to be near her daughter and her family. She enjoys cooking and maintains a diet rich in leafy greens. She has a supportive community and enjoys spending time with her neighbors.    Past Medical History:  Diagnosis Date   Anemia 03/29/2015   Arthritis    Atrophic vaginitis    Cholelithiasis 2008   Eczema    Fibrocystic breast disease    bx 2008 neg per pt report   GERD (gastroesophageal reflux disease)    Hearing loss    Hearing loss sensory, bilateral    bil/hearing aids   Hematuria    chronic Dr.  Merry Lofty   Hyperlipidemia 11/17/2016   Lupus (systemic lupus erythematosus) (HCC)    Dr. Kellie Simmering   Macular degeneration 11/17/2016   Migraine aura without headache    h/o ocular migraines   Osteopenia    Preventative health care 11/18/2016   Renal insufficiency 03/29/2015   Retinal detachment with break    right/laser   Retinal tear of right eye 11/17/2016   Skin lesion of face 01/09/2016   Spinal stenosis    Thyroid disease    hypothyroidism   Urinary tract bacterial infections    has had urethral dilations in past   Vitamin B12 deficiency 01/09/2016   Vitamin D deficiency 07/11/2015    Past Surgical History:  Procedure Laterality Date   APPENDECTOMY  2006   CATARACT EXTRACTION W/ INTRAOCULAR LENS  IMPLANT, BILATERAL     CHOLECYSTECTOMY     EYE SURGERY Bilateral    cataracts   TONSILLECTOMY  1940   VAGINAL HYSTERECTOMY     partial    Family History  Problem Relation Age of Onset   Stroke Paternal Grandmother    Stroke Father    Coronary artery disease Father    Coronary artery disease Mother    Stroke Mother    Cancer Paternal Aunt        GYN cancer   Cancer Paternal Uncle        lung, smoker   Stroke Maternal Grandmother    Stroke Maternal Grandfather  Heart disease Paternal Grandfather    Hydrocephalus Brother        normopressure hydrocephalus, dementia    Social History   Socioeconomic History   Marital status: Widowed    Spouse name: Not on file   Number of children: Not on file   Years of education: 13   Highest education level: Not on file  Occupational History   Not on file  Tobacco Use   Smoking status: Former    Current packs/day: 0.00    Types: Cigarettes    Quit date: 1970    Years since quitting: 55.1   Smokeless tobacco: Never  Substance and Sexual Activity   Alcohol use: Yes    Alcohol/week: 10.0 standard drinks of alcohol    Types: 10 Glasses of wine per week   Drug use: No   Sexual activity: Not Currently    Comment:  lives alone here because her daughter lives hier, no dietary restrictions, avoids dairy  Other Topics Concern   Not on file  Social History Narrative   Right hand   Caffeine yes   One story home   Retired   Chief Executive Officer Drivers of Corporate investment banker Strain: Low Risk  (09/17/2023)   Overall Financial Resource Strain (CARDIA)    Difficulty of Paying Living Expenses: Not hard at all  Food Insecurity: No Food Insecurity (09/17/2023)   Hunger Vital Sign    Worried About Running Out of Food in the Last Year: Never true    Ran Out of Food in the Last Year: Never true  Transportation Needs: No Transportation Needs (10/07/2023)   PRAPARE - Administrator, Civil Service (Medical): No    Lack of Transportation (Non-Medical): No  Physical Activity: Inactive (10/07/2023)   Exercise Vital Sign    Days of Exercise per Week: 0 days    Minutes of Exercise per Session: 0 min  Stress: No Stress Concern Present (09/17/2023)   [employer] of Occupational Health - Occupational Stress Questionnaire    Feeling of Stress : Not at all  Social Connections: Moderately Integrated (09/17/2023)   Social Connection and Isolation Panel [NHANES]    Frequency of Communication with Friends and Family: More than three times a week    Frequency of Social Gatherings with Friends and Family: Three times a week    Attends Religious Services: More than 4 times per year    Active Member of Clubs or Organizations: Yes    Attends Banker Meetings: More than 4 times per year    Marital Status: Widowed  Intimate Partner Violence: Not At Risk (09/17/2023)   Humiliation, Afraid, Rape, and Kick questionnaire    Fear of Current or Ex-Partner: No    Emotionally Abused: No    Physically Abused: No    Sexually Abused: No    Outpatient Medications Prior to Visit  Medication Sig Dispense Refill   albuterol (VENTOLIN HFA) 108 (90 Base) MCG/ACT inhaler Inhale 2 puffs into the lungs every 6 (six) hours as  needed for wheezing or shortness of breath. 18 g 3   aspirin EC 81 MG tablet Take 1 tablet (81 mg total) by mouth daily. Swallow whole. 30 tablet 12   atorvastatin (LIPITOR) 40 MG tablet Take 1 tablet (40 mg total) by mouth daily. 30 tablet 11   Cholecalciferol (VITAMIN D) 2000 units CAPS Take 2,000 Units by mouth daily.     donepezil (ARICEPT) 5 MG tablet Take 1 tablet (5 mg total) by  mouth every morning. 30 tablet 11   levothyroxine (SYNTHROID) 50 MCG tablet Take 1 tablet (50 mcg total) by mouth daily. 30 tablet 3   omeprazole (PRILOSEC) 20 MG capsule TAKE ONE CAPSULE BY MOUTH ONE TIME DAILY 30 capsule 5   traZODone (DESYREL) 50 MG tablet Take 0.5-1 tablets (25-50 mg total) by mouth at bedtime as needed for sleep. 90 tablet 1   Vitamin D, Ergocalciferol, (DRISDOL) 1.25 MG (50000 UNIT) CAPS capsule Take 1 capsule (50,000 Units total) by mouth every 7 (seven) days. 4 capsule 12   gabapentin (NEURONTIN) 300 MG capsule Take 1 capsule (300 mg total) by mouth daily for 1 day, THEN 1 capsule (300 mg total) 2 (two) times daily for 1 day, THEN 1 capsule (300 mg total) 3 (three) times daily for 10 days. 33 capsule 0   Facility-Administered Medications Prior to Visit  Medication Dose Route Frequency Provider Last Rate Last Admin   cyanocobalamin (VITAMIN B12) injection 1,000 mcg  1,000 mcg Intramuscular Q30 days Bradd Canary, MD   1,000 mcg at 07/22/22 1008    Allergies  Allergen Reactions   Bee Venom Anaphylaxis   Codeine Nausea And Vomiting and Nausea Only   Iodine Hives    Contrast dye   Alendronate    Bacitracin Swelling    Ophthalmic ointment   Iodinated Contrast Media    Bactrim  [Sulfamethoxazole-Trimethoprim] Hives, Itching and Rash    Review of Systems  Constitutional:  Positive for malaise/fatigue. Negative for fever.  HENT:  Negative for congestion.   Eyes:  Negative for blurred vision.  Respiratory:  Negative for shortness of breath.   Cardiovascular:  Negative for chest pain,  palpitations and leg swelling.  Gastrointestinal:  Positive for diarrhea. Negative for abdominal pain, blood in stool, constipation and nausea.  Genitourinary:  Negative for dysuria and frequency.  Musculoskeletal:  Positive for falls.  Skin:  Negative for rash.  Neurological:  Positive for headaches. Negative for dizziness and loss of consciousness.  Endo/Heme/Allergies:  Negative for environmental allergies.  Psychiatric/Behavioral:  Negative for depression. The patient is not nervous/anxious.        Objective:    Physical Exam Constitutional:      General: She is not in acute distress.    Appearance: Normal appearance. She is well-developed. She is not toxic-appearing.  HENT:     Head: Normocephalic and atraumatic.     Comments: Small bruise under right eye, healing    Right Ear: External ear normal.     Left Ear: External ear normal.     Nose: Nose normal.  Eyes:     General:        Right eye: No discharge.        Left eye: No discharge.     Conjunctiva/sclera: Conjunctivae normal.  Neck:     Thyroid: No thyromegaly.  Cardiovascular:     Rate and Rhythm: Normal rate and regular rhythm.     Heart sounds: Normal heart sounds. No murmur heard. Pulmonary:     Effort: Pulmonary effort is normal. No respiratory distress.     Breath sounds: Normal breath sounds.  Abdominal:     General: Bowel sounds are normal.     Palpations: Abdomen is soft.     Tenderness: There is no abdominal tenderness. There is no guarding.  Musculoskeletal:        General: Normal range of motion.     Cervical back: Neck supple.  Lymphadenopathy:     Cervical: No cervical  adenopathy.  Skin:    General: Skin is warm and dry.  Neurological:     Mental Status: She is alert and oriented to person, place, and time.  Psychiatric:        Mood and Affect: Mood normal.        Behavior: Behavior normal.        Thought Content: Thought content normal.        Judgment: Judgment normal.     BP (!)  150/60 (BP Location: Right Arm, Patient Position: Sitting, Cuff Size: Normal)   Pulse 64   Temp 98.3 F (36.8 C) (Oral)   Resp 16   Ht 5\' 2"  (1.575 m)   Wt 119 lb 9.6 oz (54.3 kg)   SpO2 97%   BMI 21.88 kg/m  Wt Readings from Last 3 Encounters:  11/11/23 119 lb 9.6 oz (54.3 kg)  10/24/23 120 lb 12.8 oz (54.8 kg)  10/14/23 117 lb 9.6 oz (53.3 kg)    Diabetic Foot Exam - Simple   No data filed    Lab Results  Component Value Date   WBC 8.9 10/24/2023   HGB 12.1 10/24/2023   HCT 36.0 10/24/2023   PLT 274 10/24/2023   GLUCOSE 99 10/24/2023   CHOL 239 (H) 10/14/2023   TRIG 85.0 10/14/2023   HDL 112.70 10/14/2023   LDLDIRECT 78.0 04/12/2023   LDLCALC 110 (H) 10/14/2023   ALT 15 10/24/2023   AST 23 10/24/2023   NA 136 10/24/2023   K 3.7 10/24/2023   CL 106 10/24/2023   CREATININE 0.95 10/24/2023   BUN 17 10/24/2023   CO2 23 10/24/2023   TSH 5.57 (H) 10/14/2023   INR 1.1 12/12/2022   HGBA1C 5.6 04/12/2023    Lab Results  Component Value Date   TSH 5.57 (H) 10/14/2023   Lab Results  Component Value Date   WBC 8.9 10/24/2023   HGB 12.1 10/24/2023   HCT 36.0 10/24/2023   MCV 96.3 10/24/2023   PLT 274 10/24/2023   Lab Results  Component Value Date   NA 136 10/24/2023   K 3.7 10/24/2023   CO2 23 10/24/2023   GLUCOSE 99 10/24/2023   BUN 17 10/24/2023   CREATININE 0.95 10/24/2023   BILITOT 1.0 10/24/2023   ALKPHOS 75 10/24/2023   AST 23 10/24/2023   ALT 15 10/24/2023   PROT 7.2 10/24/2023   ALBUMIN 3.9 10/24/2023   CALCIUM 9.2 10/24/2023   ANIONGAP 7 10/24/2023   GFR 47.29 (L) 10/14/2023   Lab Results  Component Value Date   CHOL 239 (H) 10/14/2023   Lab Results  Component Value Date   HDL 112.70 10/14/2023   Lab Results  Component Value Date   LDLCALC 110 (H) 10/14/2023   Lab Results  Component Value Date   TRIG 85.0 10/14/2023   Lab Results  Component Value Date   CHOLHDL 2 10/14/2023   Lab Results  Component Value Date   HGBA1C 5.6  04/12/2023       Assessment & Plan:  Stage 3 chronic kidney disease, unspecified whether stage 3a or 3b CKD (HCC) -     Comprehensive metabolic panel  Essential hypertension -     CBC with Differential/Platelet  Hyperglycemia -     Hemoglobin A1c  Mixed hyperlipidemia  Vitamin D deficiency -     VITAMIN D 25 Hydroxy (Vit-D Deficiency, Fractures)  Other orders -     Gabapentin; 1 cap po bid and 2 caps po qhs  Dispense: 120  capsule; Refill: 3    Assessment and Plan Assessment & Plan Intermittent Diarrhea No clear pattern or triggers identified. Previously more constipated. Discussed the effects of aging on digestion and nutrient absorption. -Start daily probiotic (NOW brand from pharmacy downstairs). -On days with diarrhea, consider taking cholestyramine powder to bind and solidify stool. -Continue using Imodium as needed for symptomatic relief. -Consider daily fiber supplement (Benefiber or Citrucel) to help absorb excess water in stool.  Vitamin B12 Deficiency Previously supplemented, but discontinued. Recent labs show B12 is still low. -Resume daily over-the-counter Vitamin B12 supplementation. -Recheck B12 levels in June 2025.  Vitamin D Deficiency Currently taking daily Vitamin D supplementation. -Continue daily Vitamin D supplementation. -Check Vitamin D levels with next round of blood work.  Gabapentin for Hand Pain Recently increased dose to allow for two at bedtime if needed. -Continue Gabapentin as prescribed, up to two at bedtime.  General Health Maintenance / Followup Plans -Complete blood work today for routine monitoring and to check Vitamin D levels. -Follow-up appointment in June 2025 to recheck labs and assess response to interventions for diarrhea and vitamin deficiencies.     Danise Edge, MD

## 2023-11-11 NOTE — Patient Instructions (Addendum)
 [place] for salads or to cook  NOW company probiotic daily  Take Vitamin B 12 and Vitamin D daily  Fiber once or twice daily such as Benefiber or Citracel  Diarrhea, Adult Diarrhea is frequent loose and sometimes watery bowel movements. Diarrhea can make you feel weak and cause you to become dehydrated. Dehydration is a condition in which there is not enough water or other fluids in the body. Dehydration can make you tired and thirsty, cause you to have a dry mouth, and decrease how often you urinate. Diarrhea typically lasts 2-3 days. However, it can last longer if it is a sign of something more serious. It is important to treat your diarrhea as told by your health care provider. Follow these instructions at home: Eating and drinking     Follow these recommendations as told by your health care provider: Take an oral rehydration solution (ORS). This is an over-the-counter medicine that helps return your body to its normal balance of nutrients and water. It is found at pharmacies and retail stores. Drink enough fluid to keep your urine pale yellow. Drink fluids such as water, diluted fruit juice, and low-calorie sports drinks. You can drink milk also, if desired. Sucking on ice chips is another way to get fluids. Avoid drinking fluids that contain a lot of sugar or caffeine, such as soda, energy drinks, and regular sports drinks. Avoid alcohol. Eat bland, easy-to-digest foods in small amounts as you are able. These foods include bananas, applesauce, rice, lean meats, toast, and crackers. Avoid spicy or fatty foods.  Medicines Take over-the-counter and prescription medicines only as told by your health care provider. If you were prescribed antibiotics, take them as told by your health care provider. Do not stop using the antibiotic even if you start to feel better. General instructions  Wash your hands often using soap and water for at least 20 seconds. If soap and water are not  available, use hand sanitizer. Others in the household should wash their hands as well. Hands should be washed: After using the toilet or changing a diaper. Before preparing, cooking, or serving food. While caring for a sick person or while visiting someone in a hospital. Rest at home while you recover. Take a warm bath to relieve any burning or pain from frequent diarrhea episodes. Watch your condition for any changes. Contact a health care provider if: You have a fever. Your diarrhea gets worse. You have new symptoms. You vomit every time you eat or drink. You feel light-headed, dizzy, or have a headache. You have muscle cramps. You have signs of dehydration, such as: Dark urine, very little urine, or no urine. Cracked lips. Dry mouth. Sunken eyes. Sleepiness. Weakness. You have bloody or black stools or stools that look like tar. You have severe pain, cramping, or bloating in your abdomen. Your skin feels cold and clammy. You feel confused. Get help right away if: You have chest pain or your heart is beating very quickly. You have trouble breathing or you are breathing very quickly. You feel extremely weak or you faint. These symptoms may be an emergency. Get help right away. Call 911. Do not wait to see if the symptoms will go away. Do not drive yourself to the hospital. This information is not intended to replace advice given to you by your health care provider. Make sure you discuss any questions you have with your health care provider. Document Revised: 02/17/2022 Document Reviewed: 02/17/2022 Elsevier Patient Education  2024 [employer].

## 2023-11-12 LAB — CBC WITH DIFFERENTIAL/PLATELET
Basophils Absolute: 0 10*3/uL (ref 0.0–0.1)
Basophils Relative: 0.6 % (ref 0.0–3.0)
Eosinophils Absolute: 0.3 10*3/uL (ref 0.0–0.7)
Eosinophils Relative: 4.4 % (ref 0.0–5.0)
HCT: 35 % — ABNORMAL LOW (ref 36.0–46.0)
Hemoglobin: 11.9 g/dL — ABNORMAL LOW (ref 12.0–15.0)
Lymphocytes Relative: 19.8 % (ref 12.0–46.0)
Lymphs Abs: 1.4 10*3/uL (ref 0.7–4.0)
MCHC: 33.9 g/dL (ref 30.0–36.0)
MCV: 98.2 fL (ref 78.0–100.0)
Monocytes Absolute: 0.7 10*3/uL (ref 0.1–1.0)
Monocytes Relative: 10.4 % (ref 3.0–12.0)
Neutro Abs: 4.5 10*3/uL (ref 1.4–7.7)
Neutrophils Relative %: 64.8 % (ref 43.0–77.0)
Platelets: 398 10*3/uL (ref 150.0–400.0)
RBC: 3.56 Mil/uL — ABNORMAL LOW (ref 3.87–5.11)
RDW: 14 % (ref 11.5–15.5)
WBC: 6.9 10*3/uL (ref 4.0–10.5)

## 2023-11-12 LAB — COMPREHENSIVE METABOLIC PANEL
ALT: 11 U/L (ref 0–35)
AST: 18 U/L (ref 0–37)
Albumin: 4.1 g/dL (ref 3.5–5.2)
Alkaline Phosphatase: 74 U/L (ref 39–117)
BUN: 11 mg/dL (ref 6–23)
CO2: 24 meq/L (ref 19–32)
Calcium: 9.1 mg/dL (ref 8.4–10.5)
Chloride: 103 meq/L (ref 96–112)
Creatinine, Ser: 0.94 mg/dL (ref 0.40–1.20)
GFR: 53.36 mL/min — ABNORMAL LOW (ref >60.00)
Glucose, Bld: 79 mg/dL (ref 70–99)
Potassium: 4.4 meq/L (ref 3.5–5.1)
Sodium: 137 meq/L (ref 135–145)
Total Bilirubin: 0.3 mg/dL (ref 0.2–1.2)
Total Protein: 7.1 g/dL (ref 6.0–8.3)

## 2023-11-12 LAB — VITAMIN D 25 HYDROXY (VIT D DEFICIENCY, FRACTURES): VITD: 26.68 ng/mL — ABNORMAL LOW (ref 30.00–100.00)

## 2023-11-12 LAB — HEMOGLOBIN A1C: Hgb A1c MFr Bld: 5.7 % (ref 4.6–6.5)

## 2023-11-14 ENCOUNTER — Encounter: Payer: Self-pay | Admitting: Family Medicine

## 2023-11-16 ENCOUNTER — Other Ambulatory Visit: Payer: Self-pay | Admitting: Emergency Medicine

## 2023-11-16 DIAGNOSIS — I10 Essential (primary) hypertension: Secondary | ICD-10-CM

## 2023-11-16 DIAGNOSIS — D649 Anemia, unspecified: Secondary | ICD-10-CM

## 2023-11-20 ENCOUNTER — Other Ambulatory Visit: Payer: Self-pay | Admitting: Family Medicine

## 2023-12-14 ENCOUNTER — Encounter: Payer: Self-pay | Admitting: Physician Assistant

## 2023-12-14 ENCOUNTER — Ambulatory Visit: Admitting: Physician Assistant

## 2023-12-14 VITALS — BP 120/70 | HR 84 | Resp 20 | Ht 62.0 in | Wt 113.0 lb

## 2023-12-14 DIAGNOSIS — G3184 Mild cognitive impairment, so stated: Secondary | ICD-10-CM | POA: Diagnosis not present

## 2023-12-14 MED ORDER — DONEPEZIL HCL 10 MG PO TABS: 10.0000 mg | ORAL_TABLET | Freq: Every morning | ORAL | 3 refills | Status: AC

## 2023-12-14 NOTE — Progress Notes (Signed)
 Assessment/Plan:   Memory Impairment   Molly Williams is a very pleasant 89 y.o. RH female with a history of HTN, HLD, TIA 11/2022, ocular migraines, HOH, GERD, B12 and D deficiency, anxiety, and a history of MCI  presenting today in follow-up for evaluation of memory loss. Patient is on donepezil 5 mg daily.  Patient is active, participating in ADLs without any issues, and continues to drive.  Continues to drink moderate amount of alcohol, counseling has been provided, this could affect her memory.     Recommendations:   Follow up in 6 months. Increase donepezil to 10 mg daily, side effects discussed Continue B12, B1 supplements Alcohol cessation recommended Recommend independent living facility for socialization, cognitive stimulation and safety Recommend good control of cardiovascular risk factors Continue to control mood as per PCP    Subjective:   This patient is accompanied in the office by son and daughter who supplements the history. Previous records as well as any outside records available were reviewed prior to todays visit.   Patient was last seen on 07/29/2023 with MMSE 25/30    Any changes in memory since last visit? "I think it is the same". There are some good and bad days ". She enjoys working on the computer, International aid/development worker, read, watching TV, socializing with neighbors.  repeats oneself?  Endorsed Disoriented when walking into a room?  Patient denies    Misplacing objects?  Patient denies   Wandering behavior?   denies   Any personality changes since last visit?  "Sometimes she may have a little bit of a temper, especially when it comes to needing some help, or not being able to handle certain things that she did before, such as bills. Any worsening depression?: denies.   Hallucinations or paranoia? She heard the door bell but it may have been a heavy dream. She  also thought someone had taken her ring at [facility] and accused the counter person of it, but this was  found in her drawer..  Seizures?   denies    Any sleep changes? Does not sleep very well  Denies nightmares, REM behavior or sleepwalking   Sleep apnea?   denies    Any hygiene concerns?   denies   Independent of bathing and dressing?  Endorsed  Does the patient needs help with medications? Patient is in charge, trying to get her to use the pillbox, but she refuses to do so, she prefers the bottle.  Who is in charge of the finances?  Son is in charge. Lost her credit card at [facility] recently     Any changes in appetite?  Denies.      Patient have trouble swallowing?  Denies.   Does the patient cook? Yes,  Son  says she forgot the pan with oil on the burner and  a hot plate that was left overnight     Any headaches?    denies   Vision changes? denies Chronic pain?  denies   Ambulates with difficulty?    Denies.     Recent falls or head injuries?   On February 25 the patient sustained a subdural hematoma on the anterior left falx.  In review, she was getting up to go to the bathroom, and when she tried to get out of bed she accidentally rolled and hit her head on the nightstand which has caused the fall, no LOC. CT of the C-spine without traumatic injury.   Unilateral weakness, numbness or tingling?  denies.  No further TIAs Any tremors?  denies   Any anosmia?    Denies.   Any incontinence of urine?  denies   Any bowel dysfunction?  denies      Patient lives alone, daughter nearby  Does the patient drive?  Short distances only  Continues to drink 10 glasses of wine a week and some other alcoholic beverages    Initial Evaluation 04/24/21 The patient is seen in neurologic consultation at the request of Bradd Canary, MD for the evaluation of memory.  The patient is accompanied by daughter  who supplements the history. She is a 89 y.o. year old female who has had memory issues for about 3 to 4 years, when she noticed that her instant recall and short-term memory was "not good, Andrey Campanile has to  remind me ".  She is worried that very specific information "is gone "some days are worse than others.  This creates anxiety, at which time "the memory is not existent".  She is also concerned about memory storage, but daughter reports that she is much sharper in the morning, and later in the day is when she begins to have trouble remembering.  If the conversation went on, at the end of the day she will not have any recall of the conversation.  She admits to have difficulty concentrating.  She lives alone, and is independent of dressing and changing.  She is very sociable, and she reports her mood being "happy, thankful, and likes to be helpful to other people ".  After her husband's death, the patient may have had some degree of depression, but she is now better, having found new friends, and began dating, which has helped her emotionally.  She denies irritability she likes to exercise daily.  She reports her sleep as "has always been terrible, ever since I was young, and he used to work as a Adult nurse, there is always something in my mind ".  During the night, sometimes she wakes up at 3 AM, and keeps herself busy, without going back to sleep immediately.  Because she is tired during the day, then she takes a small nap feeling refreshed.  She denies any vivid dreams or sleepwalking.  No hallucinations or paranoia.  She denies placing objects in unusual spots. She takes her own medications, without forgetting any doses.  She still manages her finances without missing any bills.  Her appetite is good, denies trouble swallowing.  She enjoys cooking, and denies leaving the stove on. She has not forgotten her common recipes, her favorite one is Warehouse manager. She ambulates without difficulty without the use of a walker or a cane.  She drives getting lost.  She only drives to places that she is familiar with.She has a history of classic headaches, with aura, but she has not have a months.  She denies any falls, head  injuries, double vision, dizziness unilateral weakness or retention, constipation or diarrhea, or anosmia.  Denies history of deep apnea.  She drinks about 10 glasses of wine a week.   Family history significant stroke in several members of her family, and NPH  with dementia in brother   Labs 8/11/22c TSH 4.16, B12 186 (low)    MRI brain wo contrast 05/09/21 No evidence of acute intracranial abnormality or reversible cause of memory loss.: No acute infarction, hemorrhage, hydrocephalus, extra-axial collection or mass lesion. Moderate atrophy and chronic microvascular ischemic disease.    Past Medical History:  Diagnosis Date   Anemia 03/29/2015  Arthritis    Atrophic vaginitis    Cholelithiasis 2008   Eczema    Fibrocystic breast disease    bx 2008 neg per pt report   GERD (gastroesophageal reflux disease)    Hearing loss    Hearing loss sensory, bilateral    bil/hearing aids   Hematuria    chronic Dr. Merry Lofty   Hyperlipidemia 11/17/2016   Lupus (systemic lupus erythematosus) (HCC)    Dr. Kellie Simmering   Macular degeneration 11/17/2016   Migraine aura without headache    h/o ocular migraines   Osteopenia    Preventative health care 11/18/2016   Renal insufficiency 03/29/2015   Retinal detachment with break    right/laser   Retinal tear of right eye 11/17/2016   Skin lesion of face 01/09/2016   Spinal stenosis    Thyroid disease    hypothyroidism   Urinary tract bacterial infections    has had urethral dilations in past   Vitamin B12 deficiency 01/09/2016   Vitamin D deficiency 07/11/2015     Past Surgical History:  Procedure Laterality Date   APPENDECTOMY  2006   CATARACT EXTRACTION W/ INTRAOCULAR LENS  IMPLANT, BILATERAL     CHOLECYSTECTOMY     EYE SURGERY Bilateral    cataracts   TONSILLECTOMY  1940   VAGINAL HYSTERECTOMY     partial     PREVIOUS MEDICATIONS:   CURRENT MEDICATIONS:  Outpatient Encounter Medications as of 12/14/2023  Medication Sig   albuterol  (VENTOLIN HFA) 108 (90 Base) MCG/ACT inhaler Inhale 2 puffs into the lungs every 6 (six) hours as needed for wheezing or shortness of breath.   atorvastatin (LIPITOR) 40 MG tablet Take 1 tablet (40 mg total) by mouth daily.   Cholecalciferol (VITAMIN D) 2000 units CAPS Take 2,000 Units by mouth daily.   gabapentin (NEURONTIN) 300 MG capsule 1 cap po bid and 2 caps po qhs   levothyroxine (SYNTHROID) 50 MCG tablet Take 1 tablet (50 mcg total) by mouth daily.   omeprazole (PRILOSEC) 20 MG capsule TAKE ONE CAPSULE BY MOUTH ONE TIME DAILY   traZODone (DESYREL) 50 MG tablet TAKE ONE-HALF TO ONE TABLET BY MOUTH AT BEDTIME AS NEEDED FOR SLEEP   Vitamin D, Ergocalciferol, (DRISDOL) 1.25 MG (50000 UNIT) CAPS capsule Take 1 capsule (50,000 Units total) by mouth every 7 (seven) days.   [DISCONTINUED] donepezil (ARICEPT) 5 MG tablet Take 1 tablet (5 mg total) by mouth every morning.   aspirin EC 81 MG tablet Take 1 tablet (81 mg total) by mouth daily. Swallow whole. (Patient not taking: Reported on 12/14/2023)   donepezil (ARICEPT) 10 MG tablet Take 1 tablet (10 mg total) by mouth every morning.   Facility-Administered Encounter Medications as of 12/14/2023  Medication   cyanocobalamin (VITAMIN B12) injection 1,000 mcg     Objective:     PHYSICAL EXAMINATION:    VITALS:   Vitals:   12/14/23 1347 12/14/23 1510  BP: (!) 166/75 120/70  Pulse: 84   Resp: 20   SpO2: 98%   Weight: 113 lb (51.3 kg)   Height: 5\' 2"  (1.575 m)     GEN:  The patient appears stated age and is in NAD. HEENT:  Normocephalic, atraumatic.   Neurological examination:  General: NAD, well-groomed, appears stated age. Orientation: The patient is alert. Oriented to person, place and not to date Cranial nerves: There is good facial symmetry.The speech is fluent and clear. No aphasia or dysarthria. Fund of knowledge is appropriate. Recent memory impaired and remote  memory is normal.  Attention and concentration are normal.  Able to  name objects and repeat phrases.  Hearing is intact to conversational tone.   Sensation: Sensation is intact to light touch throughout Motor: Strength is at least antigravity x4. DTR's 2/4 in UE/LE       No data to display             07/29/2023   12:00 PM 12/02/2021   12:00 PM 04/24/2021   12:00 PM  MMSE - Mini Mental State Exam  Orientation to time 5 5 5   Orientation to Place 4 5 5   Registration 3 3 3   Attention/ Calculation 4 5 2   Recall 2 2 2   Language- name 2 objects 2 2 2   Language- repeat 1 1 1   Language- follow 3 step command 1 3 3   Language- read & follow direction 1 1 1   Write a sentence 1 1 1   Copy design 1 1 1   Total score 25 29 26        Movement examination: Tone: There is normal tone in the UE/LE Abnormal movements:  no tremor.  No myoclonus.  No asterixis.   Coordination:  There is no decremation with RAM's. Normal finger to nose  Gait and Station: The patient has no difficulty arising out of a deep-seated chair without the use of the hands. The patient's stride length is good.  Gait is cautious and narrow.   Thank you for allowing [place] the opportunity to participate in the care of this nice patient. Please do not hesitate to contact us for any questions or concerns.   Total time spent on today's visit was 33 minutes dedicated to this patient today, preparing to see patient, examining the patient, ordering tests and/or medications and counseling the patient, documenting clinical information in the EHR or other health record, independently interpreting results and communicating results to the patient/family, discussing treatment and goals, answering patient's questions and coordinating care.  Cc:  Bradd Canary, MD  Marlowe Kays 12/14/2023 3:23 PM

## 2023-12-14 NOTE — Patient Instructions (Addendum)
 It was a pleasure to see you today at our office.   Recommendations:  Follow up in 6 months Increase  donepezil to 10 mg daily.  Recommend good control of cardiovascular risk factors.   Continue B12 and B1 supplements Recommend decreasing alcohol comsumption Try meditation and breathing excercises to help calm down and sleep better    RECOMMENDATIONS FOR ALL PATIENTS WITH MEMORY PROBLEMS: 1. Continue to exercise (Recommend 30 minutes of walking everyday, or 3 hours every week) 2. Increase social interactions - continue going to [place] and enjoy social gatherings with friends and family 3. Eat healthy, avoid fried foods and eat more fruits and vegetables 4. Maintain adequate blood pressure, blood sugar, and blood cholesterol level. Reducing the risk of stroke and cardiovascular disease also helps promoting better memory. 5. Avoid stressful situations. Live a simple life and avoid aggravations. Organize your time and prepare for the next day in anticipation. 6. Sleep well, avoid any interruptions of sleep and avoid any distractions in the bedroom that may interfere with adequate sleep quality 7. Avoid sugar, avoid sweets as there is a strong link between excessive sugar intake, diabetes, and cognitive impairment We discussed the Mediterranean diet, which has been shown to help patients reduce the risk of progressive memory disorders and reduces cardiovascular risk. This includes eating fish, eat fruits and green leafy vegetables, nuts like almonds and hazelnuts, walnuts, and also use olive oil. Avoid fast foods and fried foods as much as possible. Avoid sweets and sugar as sugar use has been linked to worsening of memory function.  There is always a concern of gradual progression of memory problems. If this is the case, then we may need to adjust level of care according to patient needs. Support, both to the patient and caregiver, should then be put into place.        FALL PRECAUTIONS: Be  cautious when walking. Scan the area for obstacles that may increase the risk of trips and falls. When getting up in the mornings, sit up at the edge of the bed for a few minutes before getting out of bed. Consider elevating the bed at the head end to avoid drop of blood pressure when getting up. Walk always in a well-lit room (use night lights in the walls). Avoid area rugs or power cords from appliances in the middle of the walkways. Use a walker or a cane if necessary and consider physical therapy for balance exercise. Get your eyesight checked regularly.  FINANCIAL OVERSIGHT: Supervision, especially oversight when making financial decisions or transactions is also recommended.  HOME SAFETY: Consider the safety of the kitchen when operating appliances like stoves, microwave oven, and blender. Consider having supervision and share cooking responsibilities until no longer able to participate in those. Accidents with firearms and other hazards in the house should be identified and addressed as well.   ABILITY TO BE LEFT ALONE: If patient is unable to contact 911 operator, consider using LifeLine, or when the need is there, arrange for someone to stay with patients. Smoking is a fire hazard, consider supervision or cessation. Risk of wandering should be assessed by caregiver and if detected at any point, supervision and safe proof recommendations should be instituted.  MEDICATION SUPERVISION: Inability to self-administer medication needs to be constantly addressed. Implement a mechanism to ensure safe administration of the medications.   DRIVING: Regarding driving, in patients with progressive memory problems, driving will be impaired. We advise to have someone else do the driving if trouble finding  directions or if minor accidents are reported. Independent driving assessment is available to determine safety of driving.   If you are interested in the driving assessment, you can contact the  following:  [employer] in [place] [phone]  Driver Rehabilitative Services [phone]  [facility] [phone] or [phone]    Mediterranean Diet A Mediterranean diet refers to food and lifestyle choices that are based on the traditions of countries located on the [employer]. This way of eating has been shown to help prevent certain conditions and improve outcomes for people who have chronic diseases, like kidney disease and heart disease. What are tips for following this plan? Lifestyle  Cook and eat meals together with your family, when possible. Drink enough fluid to keep your urine clear or pale yellow. Be physically active every day. This includes: Aerobic exercise like running or swimming. Leisure activities like gardening, walking, or housework. Get 7-8 hours of sleep each night. If recommended by your health care provider, drink red wine in moderation. This means 1 glass a day for nonpregnant women and 2 glasses a day for men. A glass of wine equals 5 oz (150 mL). Reading food labels  Check the serving size of packaged foods. For foods such as rice and pasta, the serving size refers to the amount of cooked product, not dry. Check the total fat in packaged foods. Avoid foods that have saturated fat or trans fats. Check the ingredients list for added sugars, such as corn syrup. Shopping  At the grocery store, buy most of your food from the areas near the walls of the store. This includes: Fresh fruits and vegetables (produce). Grains, beans, nuts, and seeds. Some of these may be available in unpackaged forms or large amounts (in bulk). Fresh seafood. Poultry and eggs. Low-fat dairy products. Buy whole ingredients instead of prepackaged foods. Buy fresh fruits and vegetables in-season from local farmers markets. Buy frozen fruits and vegetables in resealable bags. If you do not have access to quality  fresh seafood, buy precooked frozen shrimp or canned fish, such as tuna, salmon, or sardines. Buy small amounts of raw or cooked vegetables, salads, or olives from the deli or salad bar at your store. Stock your pantry so you always have certain foods on hand, such as olive oil, canned tuna, canned tomatoes, rice, pasta, and beans. Cooking  Cook foods with extra-virgin olive oil instead of using butter or other vegetable oils. Have meat as a side dish, and have vegetables or grains as your main dish. This means having meat in small portions or adding small amounts of meat to foods like pasta or stew. Use beans or vegetables instead of meat in common dishes like chili or lasagna. Experiment with different cooking methods. Try roasting or broiling vegetables instead of steaming or sauteing them. Add frozen vegetables to soups, stews, pasta, or rice. Add nuts or seeds for added healthy fat at each meal. You can add these to yogurt, salads, or vegetable dishes. Marinate fish or vegetables using olive oil, lemon juice, garlic, and fresh herbs. Meal planning  Plan to eat 1 vegetarian meal one day each week. Try to work up to 2 vegetarian meals, if possible. Eat seafood 2 or more times a week. Have healthy snacks readily available, such as: Vegetable sticks with hummus. Greek yogurt. Fruit and nut trail mix. Eat balanced meals throughout the week. This includes: Fruit: 2-3 servings a day Vegetables: 4-5 servings a day Low-fat dairy:  2 servings a day Fish, poultry, or lean meat: 1 serving a day Beans and legumes: 2 or more servings a week Nuts and seeds: 1-2 servings a day Whole grains: 6-8 servings a day Extra-virgin olive oil: 3-4 servings a day Limit red meat and sweets to only a few servings a month What are my food choices? Mediterranean diet Recommended Grains: Whole-grain pasta. Brown rice. Bulgar wheat. Polenta. Couscous. Whole-wheat bread. Orpah Cobb. Vegetables: Artichokes.  Beets. Broccoli. Cabbage. Carrots. Eggplant. Green beans. Chard. Kale. Spinach. Onions. Leeks. Peas. Squash. Tomatoes. Peppers. Radishes. Fruits: Apples. Apricots. Avocado. Berries. Bananas. Cherries. Dates. Figs. Grapes. Lemons. Melon. Oranges. Peaches. Plums. Pomegranate. Meats and other protein foods: Beans. Almonds. Sunflower seeds. Pine nuts. Peanuts. Cod. Salmon. Scallops. Shrimp. Tuna. Tilapia. Clams. Oysters. Eggs. Dairy: Low-fat milk. Cheese. Greek yogurt. Beverages: Water. Red wine. Herbal tea. Fats and oils: Extra virgin olive oil. Avocado oil. Grape seed oil. Sweets and desserts: [place] yogurt with honey. Baked apples. Poached pears. Trail mix. Seasoning and other foods: Basil. Cilantro. Coriander. Cumin. Mint. Parsley. Sage. Rosemary. Tarragon. Garlic. Oregano. Thyme. Pepper. Balsalmic vinegar. Tahini. Hummus. Tomato sauce. Olives. Mushrooms. Limit these Grains: Prepackaged pasta or rice dishes. Prepackaged cereal with added sugar. Vegetables: Deep fried potatoes (french fries). Fruits: Fruit canned in syrup. Meats and other protein foods: Beef. Pork. Lamb. Poultry with skin. Hot dogs. Tomasa Blase. Dairy: Ice cream. Sour cream. Whole milk. Beverages: Juice. Sugar-sweetened soft drinks. Beer. Liquor and spirits. Fats and oils: Butter. Canola oil. Vegetable oil. Beef fat (tallow). Lard. Sweets and desserts: Cookies. Cakes. Pies. Candy. Seasoning and other foods: Mayonnaise. Premade sauces and marinades. The items listed may not be a complete list. Talk with your dietitian about what dietary choices are right for you. Summary The Mediterranean diet includes both food and lifestyle choices. Eat a variety of fresh fruits and vegetables, beans, nuts, seeds, and whole grains. Limit the amount of red meat and sweets that you eat. Talk with your health care provider about whether it is safe for you to drink red wine in moderation. This means 1 glass a day for nonpregnant women and 2 glasses a day  for men. A glass of wine equals 5 oz (150 mL). This information is not intended to replace advice given to you by your health care provider. Make sure you discuss any questions you have with your health care provider. Document Released: 04/23/2016 Document Revised: 05/26/2016 Document Reviewed: 04/23/2016 Elsevier Interactive Patient Education  2017 [employer].

## 2023-12-22 ENCOUNTER — Encounter: Payer: Self-pay | Admitting: Family Medicine

## 2023-12-22 ENCOUNTER — Other Ambulatory Visit (INDEPENDENT_AMBULATORY_CARE_PROVIDER_SITE_OTHER): Payer: Medicare Other

## 2023-12-22 DIAGNOSIS — E538 Deficiency of other specified B group vitamins: Secondary | ICD-10-CM | POA: Diagnosis not present

## 2023-12-22 DIAGNOSIS — E559 Vitamin D deficiency, unspecified: Secondary | ICD-10-CM | POA: Diagnosis not present

## 2023-12-22 DIAGNOSIS — I5032 Chronic diastolic (congestive) heart failure: Secondary | ICD-10-CM | POA: Diagnosis not present

## 2023-12-22 DIAGNOSIS — D649 Anemia, unspecified: Secondary | ICD-10-CM

## 2023-12-22 DIAGNOSIS — E782 Mixed hyperlipidemia: Secondary | ICD-10-CM

## 2023-12-22 DIAGNOSIS — N183 Chronic kidney disease, stage 3 unspecified: Secondary | ICD-10-CM | POA: Diagnosis not present

## 2023-12-22 DIAGNOSIS — I10 Essential (primary) hypertension: Secondary | ICD-10-CM | POA: Diagnosis not present

## 2023-12-22 LAB — COMPREHENSIVE METABOLIC PANEL WITH GFR
ALT: 12 U/L (ref 0–35)
AST: 17 U/L (ref 0–37)
Albumin: 4.3 g/dL (ref 3.5–5.2)
Alkaline Phosphatase: 88 U/L (ref 39–117)
BUN: 20 mg/dL (ref 6–23)
CO2: 26 meq/L (ref 19–32)
Calcium: 9.3 mg/dL (ref 8.4–10.5)
Chloride: 105 meq/L (ref 96–112)
Creatinine, Ser: 0.94 mg/dL (ref 0.40–1.20)
GFR: 53.32 mL/min — ABNORMAL LOW (ref >60.00)
Glucose, Bld: 117 mg/dL — ABNORMAL HIGH (ref 70–99)
Potassium: 4.2 meq/L (ref 3.5–5.1)
Sodium: 140 meq/L (ref 135–145)
Total Bilirubin: 0.6 mg/dL (ref 0.2–1.2)
Total Protein: 7.2 g/dL (ref 6.0–8.3)

## 2023-12-22 LAB — CBC WITH DIFFERENTIAL/PLATELET
Basophils Absolute: 0 10*3/uL (ref 0.0–0.1)
Basophils Relative: 0.8 % (ref 0.0–3.0)
Eosinophils Absolute: 0.3 10*3/uL (ref 0.0–0.7)
Eosinophils Relative: 4.7 % (ref 0.0–5.0)
HCT: 39.9 % (ref 36.0–46.0)
Hemoglobin: 13.3 g/dL (ref 12.0–15.0)
Lymphocytes Relative: 25.2 % (ref 12.0–46.0)
Lymphs Abs: 1.4 10*3/uL (ref 0.7–4.0)
MCHC: 33.4 g/dL (ref 30.0–36.0)
MCV: 98.8 fl (ref 78.0–100.0)
Monocytes Absolute: 0.5 10*3/uL (ref 0.1–1.0)
Monocytes Relative: 8.7 % (ref 3.0–12.0)
Neutro Abs: 3.3 10*3/uL (ref 1.4–7.7)
Neutrophils Relative %: 60.6 % (ref 43.0–77.0)
Platelets: 317 10*3/uL (ref 150.0–400.0)
RBC: 4.04 Mil/uL (ref 3.87–5.11)
RDW: 14.2 % (ref 11.5–15.5)
WBC: 5.5 10*3/uL (ref 4.0–10.5)

## 2023-12-22 LAB — LIPID PANEL
Cholesterol: 231 mg/dL — ABNORMAL HIGH (ref 0–200)
HDL: 93.4 mg/dL (ref >39.00)
LDL Cholesterol: 124 mg/dL — ABNORMAL HIGH (ref 0–99)
NonHDL: 137.52
Total CHOL/HDL Ratio: 2
Triglycerides: 67 mg/dL (ref 0.0–149.0)
VLDL: 13.4 mg/dL (ref 0.0–40.0)

## 2023-12-22 LAB — VITAMIN B12: Vitamin B-12: 242 pg/mL (ref 211–911)

## 2023-12-22 LAB — TSH: TSH: 2.27 u[IU]/mL (ref 0.35–5.50)

## 2023-12-22 LAB — VITAMIN D 25 HYDROXY (VIT D DEFICIENCY, FRACTURES): VITD: 26.49 ng/mL — ABNORMAL LOW (ref 30.00–100.00)

## 2023-12-23 LAB — IRON,TIBC AND FERRITIN PANEL
%SAT: 35 % (ref 16–45)
Ferritin: 122 ng/mL (ref 16–288)
Iron: 93 ug/dL (ref 45–160)
TIBC: 268 ug/dL (ref 250–450)

## 2023-12-29 ENCOUNTER — Other Ambulatory Visit: Payer: Self-pay | Admitting: *Deleted

## 2023-12-29 DIAGNOSIS — I10 Essential (primary) hypertension: Secondary | ICD-10-CM

## 2023-12-29 DIAGNOSIS — N183 Chronic kidney disease, stage 3 unspecified: Secondary | ICD-10-CM

## 2023-12-29 DIAGNOSIS — I5032 Chronic diastolic (congestive) heart failure: Secondary | ICD-10-CM

## 2023-12-29 NOTE — Addendum Note (Signed)
 Addended by: Marylou Sobers D on: 12/29/2023 11:14 AM   Modules accepted: Orders

## 2023-12-30 ENCOUNTER — Telehealth: Payer: Self-pay

## 2023-12-30 NOTE — Progress Notes (Signed)
 Complex Care Management Note  Care Guide Note 12/30/2023 Name: Molly Williams MRN: [id number] DOB: Nov 21, 1930  Molly Williams is a 89 y.o. year old female who sees Neda Balk, MD for primary care. I reached out to Ace Holder by phone today to offer complex care management services.  [name] was given information about Complex Care Management services today including:   The Complex Care Management services include support from the care team which includes your Nurse Care Manager, Clinical Social Worker, or Pharmacist.  The Complex Care Management team is here to help remove barriers to the health concerns and goals most important to you. Complex Care Management services are voluntary, and the patient may decline or stop services at any time by request to their care team member.   Complex Care Management Consent Status: Patient agreed to services and verbal consent obtained.   Follow up plan:  Telephone appointment with complex care management team member scheduled for:  01/07/24 at 10:30 p.m.   Encounter Outcome:  Patient Scheduled  [facility]  [facility], [facility] Care Management Assistant Direct Dial: [phone]  Fax: [phone]

## 2024-01-07 ENCOUNTER — Telehealth: Payer: Self-pay | Admitting: Licensed Clinical Social Worker

## 2024-01-07 ENCOUNTER — Telehealth: Payer: Self-pay

## 2024-01-07 NOTE — Progress Notes (Signed)
 Complex Care Management Care Guide Note  01/07/2024 Name: Molly Williams MRN: [id number] DOB: Jun 17, 1931  Molly Williams is a 89 y.o. year old female who is a primary care patient of Neda Balk, MD and is actively engaged with the care management team. I reached out to Ace Holder by phone today to assist with re-scheduling  with the Licensed Clinical Social Worker.  Follow up plan: The care guide will reach out to the patient again over the next 7 days.  [facility]  [facility], [facility] Care Management Assistant Direct Dial: [phone]  Fax: [phone]

## 2024-01-10 ENCOUNTER — Other Ambulatory Visit: Payer: Self-pay | Admitting: Family Medicine

## 2024-01-14 ENCOUNTER — Ambulatory Visit: Admitting: Physician Assistant

## 2024-01-14 NOTE — Progress Notes (Signed)
 Complex Care Management Note  Care Guide Note 01/14/2024 Name: EDELYNN HYDER MRN: [id number] DOB: 06-16-1931  ANIVEA LUDGATE is a 89 y.o. year old female who sees Neda Balk, MD for primary care. I reached out to Ace Holder by phone today to offer complex care management services.  Ms. Grgas was given information about Complex Care Management services today including:   The Complex Care Management services include support from the care team which includes your Nurse Care Manager, Clinical Social Worker, or Pharmacist.  The Complex Care Management team is here to help remove barriers to the health concerns and goals most important to you. Complex Care Management services are voluntary, and the patient may decline or stop services at any time by request to their care team member.   Complex Care Management Consent Status: Patient agreed to services and verbal consent obtained.   Follow up plan:  Telephone appointment with complex care management team member scheduled for:  01/21/24 at 11:00 a.m.   Encounter Outcome:  Patient Scheduled  [facility]  [facility], [facility] Health Care Management Assistant Direct Dial: [phone]  Fax: [phone]

## 2024-01-21 ENCOUNTER — Other Ambulatory Visit: Payer: Self-pay | Admitting: Licensed Clinical Social Worker
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
## 2024-01-22 NOTE — Patient Instructions (Signed)
 Visit Information  Thank you for taking time to visit with me today. Please don't hesitate to contact me if I can be of assistance to you before our next scheduled appointment.  Your next care management appointment is no further scheduled appointments.    Closing From: Complex Care Management.  Please call the care guide team at [phone] if you need to cancel, schedule, or reschedule an appointment.   Please call 911 if you are experiencing a Mental Health or Behavioral Health Crisis or need someone to talk to.  Fletcher Humble MSW, LCSW Licensed Clinical Social Worker  [facility], Population Health Direct Dial: [phone]  Fax: [phone]

## 2024-01-26 ENCOUNTER — Ambulatory Visit: Payer: Medicare Other | Admitting: Physician Assistant

## 2024-04-22 ENCOUNTER — Emergency Department (HOSPITAL_BASED_OUTPATIENT_CLINIC_OR_DEPARTMENT_OTHER)

## 2024-04-22 ENCOUNTER — Encounter (HOSPITAL_BASED_OUTPATIENT_CLINIC_OR_DEPARTMENT_OTHER): Payer: Self-pay | Admitting: Emergency Medicine

## 2024-04-22 ENCOUNTER — Emergency Department (HOSPITAL_BASED_OUTPATIENT_CLINIC_OR_DEPARTMENT_OTHER)
Admission: EM | Admit: 2024-04-22 | Discharge: 2024-04-22 | Disposition: A | Discharge status: Home / Self Care | Attending: Emergency Medicine | Admitting: Emergency Medicine

## 2024-04-22 DIAGNOSIS — K219 Gastro-esophageal reflux disease without esophagitis: Secondary | ICD-10-CM | POA: Insufficient documentation

## 2024-04-22 DIAGNOSIS — H9193 Unspecified hearing loss, bilateral: Secondary | ICD-10-CM | POA: Diagnosis not present

## 2024-04-22 DIAGNOSIS — Z87891 Personal history of nicotine dependence: Secondary | ICD-10-CM | POA: Diagnosis not present

## 2024-04-22 DIAGNOSIS — E785 Hyperlipidemia, unspecified: Secondary | ICD-10-CM | POA: Insufficient documentation

## 2024-04-22 DIAGNOSIS — R42 Dizziness and giddiness: Secondary | ICD-10-CM | POA: Diagnosis not present

## 2024-04-22 DIAGNOSIS — Z7982 Long term (current) use of aspirin: Secondary | ICD-10-CM | POA: Insufficient documentation

## 2024-04-22 DIAGNOSIS — I6782 Cerebral ischemia: Secondary | ICD-10-CM | POA: Diagnosis not present

## 2024-04-22 DIAGNOSIS — I672 Cerebral atherosclerosis: Secondary | ICD-10-CM | POA: Diagnosis not present

## 2024-04-22 DIAGNOSIS — Z79899 Other long term (current) drug therapy: Secondary | ICD-10-CM | POA: Diagnosis not present

## 2024-04-22 DIAGNOSIS — R29818 Other symptoms and signs involving the nervous system: Secondary | ICD-10-CM | POA: Diagnosis not present

## 2024-04-22 LAB — COMPREHENSIVE METABOLIC PANEL WITH GFR
ALT: 11 U/L (ref 0–44)
AST: 20 U/L (ref 15–41)
Albumin: 4.2 g/dL (ref 3.5–5.0)
Alkaline Phosphatase: 92 U/L (ref 38–126)
Anion gap: 12 (ref 5–15)
BUN: 15 mg/dL (ref 8–23)
CO2: 21 mmol/L — ABNORMAL LOW (ref 22–32)
Calcium: 9.1 mg/dL (ref 8.9–10.3)
Chloride: 105 mmol/L (ref 98–111)
Creatinine, Ser: 0.96 mg/dL (ref 0.44–1.00)
GFR, Estimated: 55 mL/min — ABNORMAL LOW (ref >60)
Glucose, Bld: 91 mg/dL (ref 70–99)
Potassium: 4.4 mmol/L (ref 3.5–5.1)
Sodium: 138 mmol/L (ref 135–145)
Total Bilirubin: 0.2 mg/dL (ref 0.0–1.2)
Total Protein: 7.2 g/dL (ref 6.5–8.1)

## 2024-04-22 LAB — DIFFERENTIAL
Abs Immature Granulocytes: 0.03 K/uL (ref 0.00–0.07)
Basophils Absolute: 0 K/uL (ref 0.0–0.1)
Basophils Relative: 1 %
Eosinophils Absolute: 0.3 K/uL (ref 0.0–0.5)
Eosinophils Relative: 5 %
Immature Granulocytes: 1 %
Lymphocytes Relative: 28 %
Lymphs Abs: 1.7 K/uL (ref 0.7–4.0)
Monocytes Absolute: 0.6 K/uL (ref 0.1–1.0)
Monocytes Relative: 10 %
Neutro Abs: 3.5 K/uL (ref 1.7–7.7)
Neutrophils Relative %: 55 %

## 2024-04-22 LAB — CBC
HCT: 36.3 % (ref 36.0–46.0)
Hemoglobin: 12.1 g/dL (ref 12.0–15.0)
MCH: 32.4 pg (ref 26.0–34.0)
MCHC: 33.3 g/dL (ref 30.0–36.0)
MCV: 97.3 fL (ref 80.0–100.0)
Platelets: 303 K/uL (ref 150–400)
RBC: 3.73 MIL/uL — ABNORMAL LOW (ref 3.87–5.11)
RDW: 13.7 % (ref 11.5–15.5)
WBC: 6.2 K/uL (ref 4.0–10.5)
nRBC: 0 % (ref 0.0–0.2)

## 2024-04-22 LAB — APTT: aPTT: 37 s — ABNORMAL HIGH (ref 24–36)

## 2024-04-22 LAB — PROTIME-INR
INR: 0.9 (ref 0.8–1.2)
Prothrombin Time: 13.1 s (ref 11.4–15.2)

## 2024-04-22 LAB — URINE DRUG SCREEN
Amphetamines: NOT DETECTED
Barbiturates: NOT DETECTED
Benzodiazepines: NOT DETECTED
Cocaine: NOT DETECTED
Fentanyl: NOT DETECTED
Methadone Scn, Ur: NOT DETECTED
Opiates: NOT DETECTED
Tetrahydrocannabinol: NOT DETECTED

## 2024-04-22 LAB — ETHANOL: Alcohol, Ethyl (B): <15 mg/dL (ref <15)

## 2024-04-22 NOTE — ED Triage Notes (Signed)
 Pt sts she started feeling empty-headed about 40 min ago; feels like she would have trouble walking, but has been ambulatory w/o difficulty

## 2024-04-22 NOTE — Discharge Instructions (Signed)
 Head CT and labs without any significant abnormalities.  Symptoms will be called lightheadedness although may not exactly be that.  Recommend close follow-up with your doctor.  Return for any new or worse symptoms

## 2024-04-22 NOTE — ED Provider Notes (Addendum)
 [facility] Provider Note   CSN: [id number] Arrival date & time: 04/22/24  1508     Patient presents with: Gait Problem   Molly Williams is a 89 y.o. female.   Patient about 40 minutes prior to arrival felt strange in the head.  Described it is empty headed.  No dizziness no room spinning did not describe it as lightheaded.  But felt me there was a little bit of trouble walking the patient was able to ambulate back to the room without any difficulty.  Patient in March did have head injury with a small bleed.  No prior history of any strokes.  Patient's daughters with her and she states that her mother is very worried about a stroke.  Because there is been a strong family history of it.  Past medical history significant for migraine aura without headache.  History of ocular migraines retinal detachment with break gastroesophageal reflux disease hearing loss bilaterally.  Some memory difficulties Lupus.  Renal insufficiency hyperlipidemia some mild macular degeneration in 2018.  Patient is a former smoker quit in 1970.       Prior to Admission medications   Medication Sig Start Date End Date Taking? Authorizing Provider  albuterol  (VENTOLIN  HFA) 108 (90 Base) MCG/ACT inhaler Inhale 2 puffs into the lungs every 6 (six) hours as needed for wheezing or shortness of breath. 12/09/20   Domenica Harlene LABOR, MD  aspirin  EC 81 MG tablet Take 1 tablet (81 mg total) by mouth daily. Swallow whole. Patient not taking: Reported on 12/14/2023 12/14/22   Krishnan, Sendil K, MD  atorvastatin  (LIPITOR) 40 MG tablet Take 1 tablet (40 mg total) by mouth daily. 12/14/22   Krishnan, Sendil K, MD  Cholecalciferol  (VITAMIN D ) 2000 units CAPS Take 2,000 Units by mouth daily.    [provider]  donepezil  (ARICEPT ) 10 MG tablet Take 1 tablet (10 mg total) by mouth every morning. 12/14/23   Wertman, Sara E, PA-C  gabapentin  (NEURONTIN ) 300 MG capsule 1 cap po bid and 2 caps  po qhs 11/11/23   Domenica Harlene LABOR, MD  levothyroxine  (SYNTHROID ) 50 MCG tablet Take 1 tablet (50 mcg total) by mouth daily. 10/15/23   Domenica Harlene LABOR, MD  omeprazole  (PRILOSEC) 20 MG capsule TAKE ONE CAPSULE BY MOUTH ONE TIME DAILY 01/11/24   Domenica Harlene LABOR, MD  traZODone  (DESYREL ) 50 MG tablet TAKE ONE-HALF TO ONE TABLET BY MOUTH AT BEDTIME AS NEEDED FOR SLEEP 11/22/23   Domenica Harlene LABOR, MD  Vitamin D , Ergocalciferol , (DRISDOL ) 1.25 MG (50000 UNIT) CAPS capsule Take 1 capsule (50,000 Units total) by mouth every 7 (seven) days. 10/15/23   Domenica Harlene LABOR, MD    Allergies: Bee venom, Codeine, Iodine, Alendronate, Bacitracin, Iodinated contrast media, and Bactrim   [sulfamethoxazole -trimethoprim ]    Review of Systems  Constitutional:  Negative for chills and fever.  HENT:  Negative for ear pain and sore throat.   Eyes:  Negative for pain and visual disturbance.  Respiratory:  Negative for cough and shortness of breath.   Cardiovascular:  Negative for chest pain and palpitations.  Gastrointestinal:  Negative for abdominal pain and vomiting.  Genitourinary:  Negative for dysuria and hematuria.  Musculoskeletal:  Negative for arthralgias and back pain.  Skin:  Negative for color change and rash.  Neurological:  Positive for light-headedness. Negative for seizures and syncope.  All other systems reviewed and are negative.   Updated Vital Signs BP (!) 142/64   Pulse  61   Temp 98.4 F (36.9 C)   Resp 17   Ht 1.575 m (5' 2)   Wt 52.2 kg   SpO2 99%   BMI 21.03 kg/m   Physical Exam Vitals and nursing note reviewed.  Constitutional:      General: She is not in acute distress.    Appearance: Normal appearance. She is well-developed.  HENT:     Head: Normocephalic and atraumatic.  Eyes:     Extraocular Movements: Extraocular movements intact.     Conjunctiva/sclera: Conjunctivae normal.     Pupils: Pupils are equal, round, and reactive to light.  Cardiovascular:     Rate and Rhythm:  Normal rate and regular rhythm.     Heart sounds: No murmur heard. Pulmonary:     Effort: Pulmonary effort is normal. No respiratory distress.     Breath sounds: Normal breath sounds.  Abdominal:     Palpations: Abdomen is soft.     Tenderness: There is no abdominal tenderness.  Musculoskeletal:        General: No swelling.     Cervical back: Normal range of motion and neck supple.  Skin:    General: Skin is warm and dry.     Capillary Refill: Capillary refill takes less than 2 seconds.  Neurological:     General: No focal deficit present.     Mental Status: She is alert and oriented to person, place, and time.     Cranial Nerves: No cranial nerve deficit.     Sensory: No sensory deficit.     Motor: No weakness.     Coordination: Coordination normal.     Gait: Gait normal.     Comments: No neurodeficit  Psychiatric:        Mood and Affect: Mood normal.     (all labs ordered are listed, but only abnormal results are displayed) Labs Reviewed  APTT - Abnormal; Notable for the following components:      Result Value   aPTT 37 (*)    All other components within normal limits  CBC - Abnormal; Notable for the following components:   RBC 3.73 (*)    All other components within normal limits  COMPREHENSIVE METABOLIC PANEL WITH GFR - Abnormal; Notable for the following components:   CO2 21 (*)    GFR, Estimated 55 (*)    All other components within normal limits  ETHANOL  PROTIME-INR  DIFFERENTIAL  URINE DRUG SCREEN    EKG: EKG Interpretation Date/Time:  Saturday April 22 2024 15:20:55 EDT Ventricular Rate:  66 PR Interval:  151 QRS Duration:  88 QT Interval:  399 QTC Calculation: 418 R Axis:   27  Text Interpretation: Sinus rhythm Low voltage, precordial leads No significant change since last tracing Confirmed by Jammy Stlouis [phone]) on 04/22/2024 3:44:30 PM  Radiology: CT HEAD WO CONTRAST Result Date: 04/22/2024 CLINICAL DATA:  Trouble walking. Neuro deficit.  Feeling empty headed EXAM: CT HEAD WITHOUT CONTRAST TECHNIQUE: Contiguous axial images were obtained from the base of the skull through the vertex without intravenous contrast. RADIATION DOSE REDUCTION: This exam was performed according to the departmental dose-optimization program which includes automated exposure control, adjustment of the mA and/or kV according to patient size and/or use of iterative reconstruction technique. COMPARISON:  October 24, 2023 FINDINGS: Brain: No intracranial hemorrhage, mass effect, or evidence of acute infarct. No hydrocephalus. No extra-axial fluid collection. Age-commensurate cerebral atrophy and chronic small vessel ischemic disease. Chronic right thalamic infarct. Vascular: No hyperdense  vessel. Intracranial arterial calcification. Skull: No fracture or focal lesion. Sinuses/Orbits: No acute finding. Other: None. IMPRESSION: No acute intracranial abnormality. Electronically Signed   By: Norman Gatlin M.D.   On: 04/22/2024 16:51     Procedures   Medications Ordered in the ED - No data to display                                  Medical Decision Making Amount and/or Complexity of Data Reviewed Labs: ordered. Radiology: ordered.   Patient states that all symptoms have have resolved.  Labs without significant abnormalities.  CBC white count 6.2 hemoglobin 12.1 platelets 303.  Complete metabolic panel LFTs renal function electrolytes all normal.  CT head without any acute findings.  No evidence of any head bleed.  Patient symptoms of all resolved.  Not really a true neurodeficit.  She is refusing MRI which will require transfer.  Probably okay for discharge home and follow-up with her doctors and returning for any new or worse symptoms.  Final diagnoses:  Lightheadedness    ED Discharge Orders     None          Geraldene Hamilton, MD 04/22/24 VELVET    Geraldene Hamilton, MD 04/22/24 [phone]

## 2024-04-22 NOTE — ED Notes (Signed)
 Pt expressed no desire to go to [facility] for an MRI, but is willing to receive imaging here and go off the results.

## 2024-04-22 NOTE — ED Notes (Signed)
 Pt ambulated to the bathroom with a steady gait. Pt states that she is feeling much better than when she came in

## 2024-04-25 DIAGNOSIS — H2513 Age-related nuclear cataract, bilateral: Secondary | ICD-10-CM | POA: Diagnosis not present

## 2024-04-25 DIAGNOSIS — H40033 Anatomical narrow angle, bilateral: Secondary | ICD-10-CM | POA: Diagnosis not present

## 2024-06-19 ENCOUNTER — Ambulatory Visit: Admitting: Physician Assistant

## 2024-07-03 ENCOUNTER — Other Ambulatory Visit: Payer: Self-pay | Admitting: Family Medicine

## 2024-07-07 ENCOUNTER — Other Ambulatory Visit: Payer: Self-pay | Admitting: Family Medicine

## 2024-08-08 ENCOUNTER — Other Ambulatory Visit: Payer: Self-pay | Admitting: Physician Assistant

## 2024-08-15 ENCOUNTER — Other Ambulatory Visit: Payer: Self-pay | Admitting: Physician Assistant

## 2024-08-29 ENCOUNTER — Telehealth: Payer: Self-pay | Admitting: Family Medicine

## 2024-08-29 NOTE — Telephone Encounter (Signed)
 Copied from CRM #[id number]. Topic: Medicare AWV >> Aug 29, 2024  9:53 AM Nathanel DEL wrote: Called LVM 08/29/2024 to sched AWV. Please schedule AWV in office only.   Nathanel Paschal; Care Guide Ambulatory Clinical Support Nellie l [facility] Direct Dial: [phone]

## 2024-09-11 ENCOUNTER — Ambulatory Visit: Payer: Self-pay

## 2024-09-11 NOTE — Telephone Encounter (Signed)
 FYI Only or Action Required?: FYI only for provider: ED advised.  Patient was last seen in primary care on 11/11/2023 by Domenica Harlene LABOR, MD.  Called Nurse Triage reporting Fatigue.  Symptoms began several days ago.  Interventions attempted: Other: increasing fluids .  Symptoms are: gradually worsening.  Triage Disposition: Go to ED Now (Notify PCP)  Patient/caregiver understands and will follow disposition?: Yes- daughter will take her if she agrees if she does not agree daughter will call back              Reason for Disposition  Difficulty breathing  Answer Assessment - Initial Assessment Questions Particia daughter [facility]) noticing patient getting weaker. Reminds daily to drink water. Concerned about dementia forgetfulness. She stopped thyroid  medication , saw on new older people dont need hormones has been without this for a few days. Stopped vitamin D  and B vitamins. Tried to pre package vitamins but patient insists doing her medications. Has upcoming appointment February wondering if can do blood work before. Weakness christmas had to sit down when walking and short ness of breath, this shortness of breath not new but more pronounced. Has had dairrhea mentions could be realted to what she eats . Daugher advised given new onset shortness  of breath and increased weakness recommended ER now. Patients daughter feels patient will be agreeable and take her but If she declines Particia will call back.  Daughter does want to know if her PCP wants to do any blood work before her February appointment as well.    1. DESCRIPTION: Describe how you are feeling.     Daughter Particia reports patient is very weak noticed this since christmas and winded with walking few steps in home this is new   2. SEVERITY: How bad is it?  Can you stand and walk?     Able to walk a   few steps but winded and needs to sit  down and short of breath with walking those few steps 3. ONSET: When did these  symptoms begin? (e.g., hours, days, weeks, months)     Has had weakness but this is worse and new since christmas , the breathing with minimal exertion is beyond her baseline and new 4. CAUSE: What do you think is causing the weakness or fatigue? (e.g., not drinking enough fluids, medical problem, trouble sleeping)     Unsure daughter does report patient stopped thyroid  medication few days ago and some vitamins  5. NEW MEDICINES:  Have you started on any new medicines recently? (e.g., opioid pain medicines, benzodiazepines, muscle relaxants, antidepressants, antihistamines, neuroleptics, beta blockers)     None reported  6. OTHER SYMPTOMS: Do you have any other symptoms? (e.g., chest pain, fever, cough, SOB, vomiting, diarrhea, bleeding, other areas of pain)     Daughter reports some diarrhea could be diet related .   Patient daughter denies patient with the following chest pain, vomiting, syncope fever  Protocols used: Weakness (Generalized) and Fatigue-A-AH

## 2024-09-11 NOTE — Telephone Encounter (Signed)
 First attempt to contact pt daughter, no answer, LVM for call back to office to discuss symptoms. Placed in call back.     Message from Kevelyn M sent at 09/11/2024 11:53 AM EST  Reason for Triage: Patient's daughter calling because patient is showing signs of weakness if she walks from one room to the next patient states she is out breath. Patient is normally independent. Patient may be forgetting to take medication and vitamins. Also patient experiences frequent an upset stomach Patient's daughter would like to check her blood work.  Attempted to schedule an appointment but nothing until March 19th.  Call back # [phone]

## 2024-10-16 ENCOUNTER — Encounter: Payer: Medicare Other | Admitting: Family Medicine

## 2025-01-02 ENCOUNTER — Encounter: Admitting: Student
